# Patient Record
Sex: Male | Born: 1950 | Race: White | Hispanic: No | Marital: Married | Smoking: Current every day smoker
Health system: Southern US, Community
[De-identification: ages and names within clinical notes are randomized; demographics above are authoritative.]

## PROBLEM LIST (undated history)

## (undated) ENCOUNTER — Emergency Department (HOSPITAL_COMMUNITY): Discharge status: Against Medical Advice | Payer: Medicare Other

## (undated) DIAGNOSIS — K219 Gastro-esophageal reflux disease without esophagitis: Secondary | ICD-10-CM

## (undated) DIAGNOSIS — R0602 Shortness of breath: Secondary | ICD-10-CM

## (undated) DIAGNOSIS — I2510 Atherosclerotic heart disease of native coronary artery without angina pectoris: Secondary | ICD-10-CM

## (undated) DIAGNOSIS — Z8489 Family history of other specified conditions: Secondary | ICD-10-CM

## (undated) DIAGNOSIS — J449 Chronic obstructive pulmonary disease, unspecified: Secondary | ICD-10-CM

## (undated) DIAGNOSIS — I219 Acute myocardial infarction, unspecified: Secondary | ICD-10-CM

## (undated) DIAGNOSIS — J189 Pneumonia, unspecified organism: Secondary | ICD-10-CM

## (undated) DIAGNOSIS — C801 Malignant (primary) neoplasm, unspecified: Secondary | ICD-10-CM

## (undated) DIAGNOSIS — I10 Essential (primary) hypertension: Secondary | ICD-10-CM

## (undated) DIAGNOSIS — G4730 Sleep apnea, unspecified: Secondary | ICD-10-CM

## (undated) DIAGNOSIS — E7800 Pure hypercholesterolemia, unspecified: Secondary | ICD-10-CM

## (undated) HISTORY — PX: ANTERIOR CRUCIATE LIGAMENT REPAIR: SHX115

## (undated) HISTORY — PX: TONSILLECTOMY: SUR1361

## (undated) HISTORY — PX: APPENDECTOMY: SHX54

---

## 2002-08-10 DIAGNOSIS — I219 Acute myocardial infarction, unspecified: Secondary | ICD-10-CM

## 2002-08-10 HISTORY — PX: CORONARY ANGIOPLASTY: SHX604

## 2002-08-10 HISTORY — DX: Acute myocardial infarction, unspecified: I21.9

## 2003-02-04 ENCOUNTER — Encounter: Payer: Self-pay | Admitting: Emergency Medicine

## 2003-02-04 ENCOUNTER — Inpatient Hospital Stay (HOSPITAL_COMMUNITY): Admission: AD | Admit: 2003-02-04 | Discharge: 2003-02-06 | Payer: Self-pay | Admitting: Cardiology

## 2003-09-14 ENCOUNTER — Ambulatory Visit (HOSPITAL_COMMUNITY): Admission: RE | Admit: 2003-09-14 | Discharge: 2003-09-14 | Payer: Self-pay | Admitting: *Deleted

## 2007-11-17 ENCOUNTER — Ambulatory Visit (HOSPITAL_COMMUNITY): Admission: RE | Admit: 2007-11-17 | Discharge: 2007-11-17 | Payer: Self-pay | Admitting: Pulmonary Disease

## 2007-11-29 ENCOUNTER — Ambulatory Visit (HOSPITAL_COMMUNITY): Admission: RE | Admit: 2007-11-29 | Discharge: 2007-11-29 | Payer: Self-pay | Admitting: Pulmonary Disease

## 2007-12-15 ENCOUNTER — Ambulatory Visit: Payer: Self-pay | Admitting: Internal Medicine

## 2007-12-15 ENCOUNTER — Encounter: Payer: Self-pay | Admitting: Internal Medicine

## 2007-12-15 ENCOUNTER — Ambulatory Visit (HOSPITAL_COMMUNITY): Admission: RE | Admit: 2007-12-15 | Discharge: 2007-12-15 | Payer: Self-pay | Admitting: Internal Medicine

## 2009-01-08 ENCOUNTER — Ambulatory Visit (HOSPITAL_COMMUNITY): Admission: RE | Admit: 2009-01-08 | Discharge: 2009-01-08 | Payer: Self-pay | Admitting: Pulmonary Disease

## 2009-01-23 ENCOUNTER — Ambulatory Visit (HOSPITAL_COMMUNITY): Admission: RE | Admit: 2009-01-23 | Discharge: 2009-01-23 | Payer: Self-pay | Admitting: Pulmonary Disease

## 2009-03-15 ENCOUNTER — Ambulatory Visit: Admission: RE | Admit: 2009-03-15 | Discharge: 2009-03-15 | Payer: Self-pay | Admitting: Pulmonary Disease

## 2009-07-03 ENCOUNTER — Ambulatory Visit (HOSPITAL_COMMUNITY): Admission: RE | Admit: 2009-07-03 | Discharge: 2009-07-03 | Payer: Self-pay | Admitting: Pulmonary Disease

## 2009-07-26 ENCOUNTER — Ambulatory Visit (HOSPITAL_COMMUNITY): Admission: RE | Admit: 2009-07-26 | Discharge: 2009-07-26 | Payer: Self-pay | Admitting: Pulmonary Disease

## 2010-12-23 NOTE — Procedures (Signed)
NAME:  Carlos Goodwin, Carlos Goodwin NO.:  [id number]   MEDICAL RECORD NO.:  [id number]          PATIENT TYPE:  OUT   LOCATION:  SLEE                          FACILITY:  [facility]   PHYSICIAN:  Kofi A. Gerilyn Pilgrim, M.D. DATE OF BIRTH:  27-Oct-1950   DATE OF PROCEDURE:  03/15/2009  DATE OF DISCHARGE:  03/15/2009                             SLEEP DISORDER REPORT   INDICATIONS:  A 60 year old man who presents with loud snoring,  hypersomnia, and is being evaluated for obstructive sleep apnea  syndrome.   MEDICATIONS:  Spiriva.   EPWORTH SLEEPINESS SCALE:  14.   BMI:  39.   ARCHITECTURAL SUMMARY:  This is a split-night recording, with the first  part being a diagnostic and the second part a titration recording.  The  total recording time is 400.5 minutes.  The sleep efficiency is 85%.  Sleep latency is 27.5 minutes.  REM latency 65 minutes.   RESPIRATORY SUMMARY:  Baseline oxygen saturation 90.  Lowest saturation  62 during REM sleep during the first part of the recording.  The patient  had a diagnostic AHI of 100.  The patient was subsequently titrated  between pressures of 6 and 18, with an optimal pressure of 16 resulting  in resolution of events, with no residual central or obstructive apneas.  Higher pressures tended to result in more central events.   LIMB MOVEMENT SUMMARY:  PLM index is 24.   ELECTROCARDIOGRAM SUMMARY:  Average heart rate 67, with no significant  dysrhythmias observed.   IMPRESSION:  1. Severe obstructive sleep apnea syndrome, which responds well to a      continuous positive airway pressure of 16.  2. 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   Thanks for this referral.      Kofi A. Gerilyn Pilgrim, M.D.  Electronically Signed     KAD/MEDQ  D:  03/22/2009  T:  03/22/2009  Job:  [id number]

## 2010-12-23 NOTE — Procedures (Signed)
NAME:  Carlos Goodwin, Carlos Goodwin NO.:  [id number]   MEDICAL RECORD NO.:  [id number]          PATIENT TYPE:  OUT   LOCATION:  RESP                          FACILITY:  [facility]   PHYSICIAN:  Edward L. Juanetta Gosling, M.D.DATE OF BIRTH:  1951/06/15   DATE OF PROCEDURE:  11/29/2007  DATE OF DISCHARGE:                            PULMONARY FUNCTION TEST   1. Spirometry shows a mild-to-moderate ventilatory defect with      evidence of airflow obstruction.  2. Lung volumes show a separate restrictive change.  Noting the      patient's height and weight, this may be related to body habitus.  3. DLCO is normal.  4. Arterial blood gases show relative hypoxia and elevation of pCO2,      which is chronic suggesting chronic respiratory failure.  5. There is no significant bronchodilator improvement.      Edward L. Juanetta Gosling, M.D.  Electronically Signed     ELH/MEDQ  D:  11/29/2007  T:  11/30/2007  Job:  [id number]

## 2010-12-23 NOTE — Op Note (Signed)
NAME:  OLLIE, ESTY NO.:  [id number]   MEDICAL RECORD NO.:  [id number]          PATIENT TYPE:  AMB   LOCATION:  DAY                           FACILITY:  [facility]   PHYSICIAN:  R. Roetta Sessions, M.D. DATE OF BIRTH:  1951/01/26   DATE OF PROCEDURE:  12/15/2007  DATE OF DISCHARGE:                               OPERATIVE REPORT   Colonoscopy with snare polypectomy and ablation biopsy.   INDICATIONS FOR PROCEDURE:  A 60 year old gentleman comes for screening  colonoscopy.  He has no lower GI tract symptoms.  No family history of  colon cancer in any first-degree relatives.  He had a colonoscopy for  rectal bleeding some 20 years ago in [place] without significant  findings.  Colonoscopy is now being done.  This approach has been  discussed with the patient at length.  Potential risks, benefits, and  alternatives have been reviewed.  Questions are answered.  He is  agreeable.  Please see documentation in medical record.   PROCEDURE NOTE:  O2 saturation, blood pressure, pulse, and respirations  were monitored throughout the entire procedure.   CONSCIOUS SEDATION:  Versed 5 mg IV and Demerol 75 mg IV in divided  doses.   INSTRUMENT:  Pentax video chip system.   FINDINGS:  Digital rectal exam revealed no abnormalities.   ENDOSCOPIC FINDINGS:  Prep was poor, particularly on the right side made  exam more difficult.  Colon:  Colonic mucosa was surveyed from the  rectosigmoid junction to the left transverse right colon to the  appendiceal orifice, ileocecal valve, and cecum.  These structures are  seen and photographed for the record.  From this level, scope was slowly  withdrawn.  All previous mentioned mucosal surfaces were again seen.  The patient has scattered left-sided diverticula.  There was 5-6 mm  rectosigmoid polyp, which was hot snared and there were 2 adjacent  diminutive polyps, which were cold biopsied.  There was an 8-mm polyp in  the mid descending  colon, which was hot snared and there were 2 adjacent  diminutive polyps, which were ablated with the tip of hot snare cautery  unit.  The remainder of colonic mucosa appeared normal.  However, the  prep particular in the right side was poor and despite copious washing  and lavage, I was unable to clear the colon to inspect the finer mucosal  detail of the colon.  On this side, the poor prep made exam more  difficult.  Scope was pulled down the rectum where a thorough  examination of the rectal mucosa including retroflexed view of the anal  verge demonstrated no abnormalities.  The patient tolerated the  procedure well and was reactive to endoscopy.   IMPRESSION:  1. Normal rectum.  2. Left-sided diverticula, polyps in the descending and rectosigmoid      areas as described above, ablated and/or removed as described      above.  3. Poor prep compromised exam.   RECOMMENDATIONS:  1. Diverticulosis.  Polyp literature provided to Mr. Delcarlo.  2. Follow up on path.  3. Further recommendations to  follow.      Jonathon Bellows, M.D.  Electronically Signed     RMR/MEDQ  D:  12/15/2007  T:  12/15/2007  Job:  [id number]   cc:   Ramon Dredge L. Juanetta Gosling, M.D.  Fax: [phone]

## 2010-12-26 NOTE — Cardiovascular Report (Signed)
NAME:  Carlos Goodwin, ROOD NO.:  [id number]   MEDICAL RECORD NO.:  [id number]                   PATIENT TYPE:  INP   LOCATION:  2004                                 FACILITY:  [facility]   PHYSICIAN:  Charlies Constable, M.D.                  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  Troponins  returned positive for a non ST elevation infarction and he was transferred  to [facility] for further evaluation and angiography.  This was performed  by Charlton Haws, M.D. early this morning and showed a tight lesion in the  proximal LAD.   PROCEDURE:  The procedure was performed via the right femoral artery  utilizing an arterial sheath and 7-French JL4 guiding catheter with side  holes.  The patient was on an Integrilin drip and was given additional  heparin for an ACT greater than 200 seconds.  We crossed the lesion in the  proximal LAD with a Luge wire without difficulty.  We predilated with a 4.0  x 15 mm Quantum Maverick performing three inflations up to 12 atmospheres  for 30 seconds.  We then deployed a 2.5 x 18 mm CYPHER stent filling this  with one inflation up to 18 atmospheres for 30 seconds.  We then post  dilated with the 4.0 x 15 mm Quantum Maverick performing two inflations up  to 18 atmospheres for 30 seconds.  Repeat diagnostics were then performed  through the guiding catheter.  The patient tolerated procedure well and left  the laboratory in satisfactory condition.   RESULTS:  Initially the stenosis in the proximal LAD was estimated at 95%.  Following stenting this improved to less than 10%.  There is no dissection  seen.   CONCLUSION:  Successful  stenting of the lesion in the proximal left anterior  descending artery with a CYPHER stent with improvement in-stent narrowing  from 95% to less than 10%.   DISPOSITION:  The patient will stay for further observation.                                               Charlies Constable, M.D.    BB/MEDQ  D:  02/05/2003  T:  02/05/2003  Job:  [id number]  Ramon Dredge L. Juanetta Gosling, M.D.  [address]  [place]  [place] [address]  Fax: [phone]   Jesse Sans. Wall, M.D.   Cardiopulmonary Lab   cc:   Ramon Dredge L. Juanetta Gosling,  M.D.  [address]  [place]  [place] [address]  Fax: [phone]   Jesse Sans. Wall, M.D.   Cardiopulmonary Lab

## 2010-12-26 NOTE — H&P (Signed)
NAME:  Carlos Goodwin, Carlos Goodwin NO.:  [id number]   MEDICAL RECORD NO.:  [id number]                   PATIENT TYPE:  INP   LOCATION:  2004                                 FACILITY:  [facility]   PHYSICIAN:  Creta Levin, M.D. [facility]      DATE OF BIRTH:  October 02, 1950   DATE OF ADMISSION:  02/04/2003  DATE OF DISCHARGE:                                HISTORY & PHYSICAL   CHIEF COMPLAINT:  Chest pain.   HISTORY OF PRESENT ILLNESS:  Carlos Goodwin is a 60 year old male with a history  of hyperlipidemia and ongoing tobacco use who was in his usual state of  health until 11:00 p.m. yesterday when he developed substernal chest pain  that lasted for 30 minutes and resolved. Today around noon, he developed 8  over 10 substernal chest pain that radiated to his left upper extremity, was  associated with dyspnea and diaphoresis as well as presyncope. He was  brought to [facility] by his wife where he was given sublingual  nitroglycerin and had complete relief of pain. His ECG was unremarkable but  he did have a borderline troponin elevation. He was therefore sent to [facility] for further management.   PAST MEDICAL HISTORY:  1. Hyperlipidemia. The patient was prescribed Lipitor by his primary care     physician 3-4 years ago but he self-discontinued this due to complaints     of fatigue.  2. Appendectomy.  3. Tonsillectomy.  4. He does have occasional heart burn.   MEDICATIONS:  Zantac p.r.n.   ALLERGIES:  No known drug allergies.   SOCIAL HISTORY:  He lives in [place], [place] with his wife and works as a  Corporate investment banker. He has a 40+ pack year smoking history and currently  smokes 1 pack per day. He has 2-3 drinks per week. He does not follow a  specific diet and does not formerly exercise.   FAMILY HISTORY:  Significant for coronary disease in his father with onset  in his 77's. His father died in his 81's.   REVIEW OF SYSTEMS:  Positive for some  sweats as well as chest discomfort,  dyspnea, and diaphoresis and pre-syncope as mentioned in the HPI. He does  have occasional reflux symptoms but does not have any at this time.   PHYSICAL EXAMINATION:  VITAL SIGNS: Afebrile. Blood pressure is 156/90.  Heart rate is 78 and regular. Weight was 205.8 pounds and height is 5 feet  10 inches.  GENERAL: He is in no acute distress.  HEENT: Unremarkable.  NECK: No JVD. No bruits.  CARDIOVASCULAR: Regular rate and rhythm without murmur, rub, or gallop.  LUNGS: Revealed mild upper airway crackles and wheezes consistent with  smokers lungs.  ABDOMEN: Soft, nondistended and nontender without organomegaly. Bowel sounds  are normal.  EXTREMITIES: No clubbing, cyanosis, or edema.  NEUROLOGIC: Nonfocal.   LABORATORY DATA:  Thus far, his white count is 10.4, hemoglobin  15.2,  hematocrit 45.3, platelet count 294,000. Sodium 141, potassium 4.3, chloride  107, bicarbonate 28, BUN 14, creatinine 1.3, glucose 115. CK was 59, MB 1.4,  troponin I 0.07. PTT 29, PT 11.9, INR 0.8.   Portable chest x-ray from the outside hospital showed no acute disease. His  ECG revealed normal sinus rhythm with no evidence of ST or T wave  abnormalities.   ASSESSMENT/PLAN:  1. Chest pain, acute coronary syndrome. The patient has a good store and a     borderline troponin elevation. He was started on Lovenox at [facility] and I will continue this and add Integrelin. He was given 5 mg     of IV Metoprolol and I will start 25 mg p.o. q. 6h. I will also start     Ramipril 2.5 mg per day and Zocor 40 mg q.h.s. In the morning, we will     check his lipid panel and we will also check a hemoglobin A1C. We will     obtain at least 2 more sets of cardiac markers and likely proceed with     cardiac catheterization tomorrow. Further treatment will be determined     based on his coronary anatomy.  2. Smoking. I discussed smoking cessation at pretty good length with  the     patient. He understands that he needs to quit and perhaps we can arrange     for him a __________ approach.                                                  Creta Levin, M.D. [facility]    RPK/MEDQ  D:  02/04/2003  T:  02/04/2003  Job:  [id number]

## 2010-12-26 NOTE — Cardiovascular Report (Signed)
   NAME:  Carlos Goodwin, Carlos Goodwin NO.:  [id number]   MEDICAL RECORD NO.:  [id number]                   PATIENT TYPE:  INP   LOCATION:  2004                                 FACILITY:  [facility]   PHYSICIAN:  Charlton Haws, M.D.                  DATE OF BIRTH:  [birth year]   DATE OF PROCEDURE:  02/05/2003  DATE OF DISCHARGE:                              CARDIAC CATHETERIZATION   INDICATION:  Unstable angina.   PROCEDURE:  Standard catheterization was done from the right femoral artery.   The left main coronary artery has a 20% discrete lesion.   The left anterior descending artery was a large caliber vessel.  There is a  95% mid vessel lesion.   The first diagonal branch was somewhat small with a 20% discrete lesion.   The circumflex coronary artery was large and left dominant.  There was 20-  30% discrete lesions in the first obtuse marginal branch.  There was 30%  tubular disease distally.   The right coronary artery was small and nondominant.   RIGHT VENTRICULOGRAPHY:  Right ventricular angiography showed moderate  anterior apical wall hypokinesis.  The EF was 50%.  There was no MR and no  gradient across the  aortic valve.   PRESSURES:  Aortic pressure was 135/81, LV pressure was 136/19 range.   IMPRESSION:  Films will be reviewed with Dr. Juanda Chance.  We will most likely  refer him for angioplasty and stenting of the mid left anterior descending.                                                Charlton Haws, M.D.    PN/MEDQ  D:  02/05/2003  T:  02/05/2003  Job:  [id number]  Ramon Dredge L. Juanetta Gosling, M.D.  [address]  [place]  [place] [address]  Fax: [phone]   Jesse Sans. Wall, M.D.   cc:   Oneal Deputy. Juanetta Gosling, M.D.  [address]  [place]  [place] [address]  Fax: [phone]   Jesse Sans. Wall, M.D.

## 2010-12-26 NOTE — Discharge Summary (Signed)
NAME:  Carlos Goodwin, Carlos Goodwin NO.:  [id number]   MEDICAL RECORD NO.:  [id number]                   PATIENT TYPE:  INP   LOCATION:  6523                                 FACILITY:  [facility]   PHYSICIAN:  [place] Bing, M.D.               DATE OF BIRTH:  18-Mar-1951   DATE OF ADMISSION:  02/04/2003  DATE OF DISCHARGE:  02/06/2003                           DISCHARGE SUMMARY - REFERRING   PROCEDURES:  Coronary angiogram/stent ([facility]) June 28.   REASON FOR ADMISSION:  Carlos Goodwin is a 60 year old male, with no prior  history of heart disease and multiple risk factors notable for dyslipidemia  and continued tobacco use, who initially presented to [facility]  for evaluation of new-onset chest discomfort.  He reported complete relief  with nitroglycerine; initial Troponin was slightly elevated; the patient was  stabilized with recommendation to be transferred to [facility] for further  management and diagnostic evaluation.  Please refer to dictated admission  note for more details.   LABORATORY DATA:  Cardiac enzymes:  Peak CPK benign/1.4; Peak Troponin 0.16.  CBC and metabolic profile normal at discharge.  Hemoglobin A1C 6.1.  Lipid  profile pending.  Admission CXR:  No active disease.   HOSPITAL COURSE:  Following transfer from [facility], the patient  was maintained on a regimen consisting of aspirin, beta blocker, Lovenox,  and Integrilin.  He was started on cholesterol lowering therapy and placed  on an ACE inhibitor.  Serial cardiac enzymes notable for normal total CPK  with mildly elevated Troponin I (0.16).  Diagnostic coronary angiogram  revealed severe single vessel coronary artery disease with a 95% mid LAD  lesion and mild LV dysfunction (approximately 50%), with mild interior  apical hypokinesis.  The patient was referred for percutaneous intervention  and underwent subsequently successful stenting (CYPHER) by Dr. Juanda Chance, with  reduction of the LAD lesion to less than 10% of the stricture stenosis.  The  patient was kept for overnight observation, cleared for discharge the  following morning in hemodynamically stable condition.   Dr. Juanda Chance recommended continuation of Plavix for 3-12 months.  The patient  did receive a smoking cessation consultation while in hospital, and will be  discharged on Wellbutrin.  The patient is also recommended to enroll in  cardiac rehab at [facility].   MEDICATIONS AT DISCHARGE:  1. Plavix 75 mg daily (at least 3 months).  2. Coated aspirin 325 daily.  3. Toprol XL 100 mg daily.  4. Zocor 40 mg q.h.s.  5. Altace 2.5 mg daily.  6. Wellbutrin XL 150 daily.  7. Nitroglycerine 0.4 mg p.r.n.   DISCHARGE INSTRUCTIONS:  No heavy lifting or return to work for 2 weeks; no  driving for one week; low cholesterol diet; call for any skin changes;  discontinue smoking.   FOLLOW UP:  The patient is scheduled to follow up with Dr. Tinnie Gens  Dorethea Clan at  [facility] on July 27 at 12:30 p.m.    DISCHARGE DIAGNOSES:  1. Non-ST-segment myocardial infarction/severe single vessel coronary artery     disease.     A. Status post stent (CYPHER) left anterior descending artery - February 05, 2003.     B. Mild left ventricular dysfunction with ejection fraction of 50%.  2. Tobacco.  3. History of dyslipidemia.     [name], P.A. [facility]                      [place] Bing, M.D.    GS/MEDQ  D:  02/06/2003  T:  02/06/2003  Job:  [id number]   cc:   Ramon Dredge L. Juanetta Gosling, M.D.  [address]  [place]  [place] [address]  Fax: [phone]    cc:   Oneal Deputy. Juanetta Gosling, M.D.  [address]  [place]  [place] [address]  Fax: [phone]

## 2010-12-26 NOTE — Procedures (Signed)
NAME:  DOMENIK, TRICE NO.:  [id number]   MEDICAL RECORD NO.:  [id number]                   PATIENT TYPE:  OUT   LOCATION:  RAD                                  FACILITY:  [facility]   PHYSICIAN:  [place] Bing, M.D.               DATE OF BIRTH:  09/20/1950   DATE OF PROCEDURE:  09/14/2003  DATE OF DISCHARGE:  09/14/2003                                  ECHOCARDIOGRAM   CLINICAL DATA:  A 60 year old gentleman with coronary disease.   M-MODE TRACINGS:  Aorta 2.6.   Left atrium 4.2.   Septum 1.4.   Posterior wall 1.0.   Left ventricular diastole 4.7.   Left ventricular systole 3.7.   IMPRESSION:  1. Technically adequate echocardiographic study.  2. Normal left and right atrial size.  3. Right ventricle appears somewhat dilated; normal right ventricular     systolic function.  4. Normal tricuspid, mitral, aortic, and pulmonic valves.  5. Normal internal dimension, wall thickness, regional and global function     of the left ventricle.  6. Normal inferior vena cava.      ___________________________________________                                            [place] Bing, M.D.   RR/MEDQ  D:  09/16/2003  T:  09/16/2003  Job:  [id number]

## 2011-05-05 LAB — BLOOD GAS, ARTERIAL
TCO2: 24.7
pCO2 arterial: 49.8 — ABNORMAL HIGH
pH, Arterial: 7.382

## 2013-01-03 ENCOUNTER — Encounter: Payer: Self-pay | Admitting: Internal Medicine

## 2013-03-07 ENCOUNTER — Emergency Department (HOSPITAL_COMMUNITY): Payer: Medicare Other

## 2013-03-07 ENCOUNTER — Observation Stay (HOSPITAL_COMMUNITY)
Admission: EM | Admit: 2013-03-07 | Discharge: 2013-03-09 | Disposition: A | Discharge status: Home / Self Care | Payer: Medicare Other | Attending: Pulmonary Disease | Admitting: Pulmonary Disease

## 2013-03-07 ENCOUNTER — Encounter (HOSPITAL_COMMUNITY): Payer: Self-pay | Admitting: *Deleted

## 2013-03-07 DIAGNOSIS — E669 Obesity, unspecified: Secondary | ICD-10-CM | POA: Insufficient documentation

## 2013-03-07 DIAGNOSIS — G4730 Sleep apnea, unspecified: Secondary | ICD-10-CM

## 2013-03-07 DIAGNOSIS — G4733 Obstructive sleep apnea (adult) (pediatric): Secondary | ICD-10-CM | POA: Insufficient documentation

## 2013-03-07 DIAGNOSIS — J4489 Other specified chronic obstructive pulmonary disease: Secondary | ICD-10-CM | POA: Insufficient documentation

## 2013-03-07 DIAGNOSIS — I252 Old myocardial infarction: Secondary | ICD-10-CM | POA: Insufficient documentation

## 2013-03-07 DIAGNOSIS — I2510 Atherosclerotic heart disease of native coronary artery without angina pectoris: Secondary | ICD-10-CM | POA: Insufficient documentation

## 2013-03-07 DIAGNOSIS — R0989 Other specified symptoms and signs involving the circulatory and respiratory systems: Secondary | ICD-10-CM | POA: Insufficient documentation

## 2013-03-07 DIAGNOSIS — Z720 Tobacco use: Secondary | ICD-10-CM

## 2013-03-07 DIAGNOSIS — Z23 Encounter for immunization: Secondary | ICD-10-CM | POA: Insufficient documentation

## 2013-03-07 DIAGNOSIS — E7800 Pure hypercholesterolemia, unspecified: Secondary | ICD-10-CM | POA: Insufficient documentation

## 2013-03-07 DIAGNOSIS — F17200 Nicotine dependence, unspecified, uncomplicated: Secondary | ICD-10-CM | POA: Insufficient documentation

## 2013-03-07 DIAGNOSIS — J449 Chronic obstructive pulmonary disease, unspecified: Secondary | ICD-10-CM | POA: Diagnosis present

## 2013-03-07 DIAGNOSIS — I10 Essential (primary) hypertension: Secondary | ICD-10-CM | POA: Insufficient documentation

## 2013-03-07 DIAGNOSIS — R072 Precordial pain: Principal | ICD-10-CM | POA: Insufficient documentation

## 2013-03-07 DIAGNOSIS — R0609 Other forms of dyspnea: Secondary | ICD-10-CM | POA: Insufficient documentation

## 2013-03-07 DIAGNOSIS — Z9861 Coronary angioplasty status: Secondary | ICD-10-CM | POA: Insufficient documentation

## 2013-03-07 DIAGNOSIS — R079 Chest pain, unspecified: Secondary | ICD-10-CM

## 2013-03-07 DIAGNOSIS — Z6838 Body mass index (BMI) 38.0-38.9, adult: Secondary | ICD-10-CM | POA: Insufficient documentation

## 2013-03-07 DIAGNOSIS — Z79899 Other long term (current) drug therapy: Secondary | ICD-10-CM | POA: Insufficient documentation

## 2013-03-07 HISTORY — DX: Sleep apnea, unspecified: G47.30

## 2013-03-07 HISTORY — DX: Chronic obstructive pulmonary disease, unspecified: J44.9

## 2013-03-07 HISTORY — DX: Pure hypercholesterolemia, unspecified: E78.00

## 2013-03-07 HISTORY — DX: Acute myocardial infarction, unspecified: I21.9

## 2013-03-07 HISTORY — DX: Essential (primary) hypertension: I10

## 2013-03-07 HISTORY — DX: Atherosclerotic heart disease of native coronary artery without angina pectoris: I25.10

## 2013-03-07 LAB — PRO B NATRIURETIC PEPTIDE: Pro B Natriuretic peptide (BNP): 185.9 pg/mL — ABNORMAL HIGH (ref 0–125)

## 2013-03-07 LAB — CBC
MCV: 88.6 fL (ref 78.0–100.0)
Platelets: 211 10*3/uL (ref 150–400)
RDW: 14.6 % (ref 11.5–15.5)
WBC: 10.2 10*3/uL (ref 4.0–10.5)

## 2013-03-07 LAB — TROPONIN I
Troponin I: <0.3 ng/mL (ref <0.30)
Troponin I: <0.3 ng/mL (ref <0.30)

## 2013-03-07 LAB — LIPID PANEL
Cholesterol: 190 mg/dL (ref 0–200)
Total CHOL/HDL Ratio: 5.1 RATIO
VLDL: 28 mg/dL (ref 0–40)

## 2013-03-07 LAB — BASIC METABOLIC PANEL
Calcium: 9.4 mg/dL (ref 8.4–10.5)
Chloride: 101 mEq/L (ref 96–112)
Creatinine, Ser: 0.95 mg/dL (ref 0.50–1.35)
GFR calc Af Amer: >90 mL/min (ref >90)
Sodium: 138 mEq/L (ref 135–145)

## 2013-03-07 MED ORDER — NITROGLYCERIN 2 % TD OINT
0.5000 [in_us] | TOPICAL_OINTMENT | Freq: Once | TRANSDERMAL | Status: AC
  Administered 2013-03-07: 0.5 [in_us] via TOPICAL
  Filled 2013-03-07: qty 1

## 2013-03-07 MED ORDER — PNEUMOCOCCAL VAC POLYVALENT 25 MCG/0.5ML IJ INJ
0.5000 mL | INJECTION | INTRAMUSCULAR | Status: AC
  Administered 2013-03-08: 0.5 mL via INTRAMUSCULAR
  Filled 2013-03-07: qty 0.5

## 2013-03-07 MED ORDER — SODIUM CHLORIDE 0.9 % IJ SOLN: 3.0000 mL | INTRAMUSCULAR | Status: DC | PRN

## 2013-03-07 MED ORDER — SODIUM CHLORIDE 0.9 % IJ SOLN: 3.0000 mL | Freq: Two times a day (BID) | INTRAMUSCULAR | Status: DC

## 2013-03-07 MED ORDER — BISACODYL 10 MG RE SUPP: 10.0000 mg | Freq: Every day | RECTAL | Status: DC | PRN

## 2013-03-07 MED ORDER — ENOXAPARIN SODIUM 40 MG/0.4ML ~~LOC~~ SOLN
40.0000 mg | SUBCUTANEOUS | Status: DC
  Administered 2013-03-07: 40 mg via SUBCUTANEOUS
  Filled 2013-03-07 (×2): qty 0.4

## 2013-03-07 MED ORDER — ASPIRIN 325 MG PO TABS
325.0000 mg | ORAL_TABLET | Freq: Every day | ORAL | Status: DC
  Administered 2013-03-07 – 2013-03-08 (×2): 325 mg via ORAL
  Filled 2013-03-07 (×3): qty 1

## 2013-03-07 MED ORDER — TRAZODONE 25 MG HALF TABLET
25.0000 mg | ORAL_TABLET | Freq: Every evening | ORAL | Status: DC | PRN
  Filled 2013-03-07: qty 1

## 2013-03-07 MED ORDER — ONDANSETRON HCL 4 MG PO TABS: 4.0000 mg | ORAL_TABLET | Freq: Four times a day (QID) | ORAL | Status: DC | PRN

## 2013-03-07 MED ORDER — SODIUM CHLORIDE 0.9 % IJ SOLN
3.0000 mL | Freq: Two times a day (BID) | INTRAMUSCULAR | Status: DC
  Administered 2013-03-07 – 2013-03-08 (×3): 3 mL via INTRAVENOUS

## 2013-03-07 MED ORDER — FAMOTIDINE 10 MG PO TABS
10.0000 mg | ORAL_TABLET | Freq: Every day | ORAL | Status: DC
  Administered 2013-03-07 – 2013-03-08 (×2): 10 mg via ORAL
  Filled 2013-03-07 (×2): qty 1

## 2013-03-07 MED ORDER — ONDANSETRON HCL 4 MG/2ML IJ SOLN: 4.0000 mg | Freq: Four times a day (QID) | INTRAMUSCULAR | Status: DC | PRN

## 2013-03-07 MED ORDER — SODIUM CHLORIDE 0.9 % IV SOLN: 250.0000 mL | INTRAVENOUS | Status: DC | PRN

## 2013-03-07 MED ORDER — SIMVASTATIN 40 MG PO TABS
40.0000 mg | ORAL_TABLET | Freq: Every evening | ORAL | Status: DC
  Administered 2013-03-07 – 2013-03-08 (×2): 40 mg via ORAL
  Filled 2013-03-07: qty 2
  Filled 2013-03-07 (×2): qty 1

## 2013-03-07 MED ORDER — ALBUTEROL SULFATE (5 MG/ML) 0.5% IN NEBU
2.5000 mg | INHALATION_SOLUTION | Freq: Four times a day (QID) | RESPIRATORY_TRACT | Status: DC
  Administered 2013-03-07 – 2013-03-08 (×3): 2.5 mg via RESPIRATORY_TRACT
  Filled 2013-03-07 (×4): qty 0.5

## 2013-03-07 MED ORDER — MORPHINE SULFATE 2 MG/ML IJ SOLN: 1.0000 mg | INTRAMUSCULAR | Status: DC | PRN

## 2013-03-07 MED ORDER — IPRATROPIUM BROMIDE 0.02 % IN SOLN
0.5000 mg | Freq: Four times a day (QID) | RESPIRATORY_TRACT | Status: DC
  Administered 2013-03-07 – 2013-03-08 (×3): 0.5 mg via RESPIRATORY_TRACT
  Filled 2013-03-07 (×4): qty 2.5

## 2013-03-07 MED ORDER — NITROGLYCERIN 0.4 MG SL SUBL
0.4000 mg | SUBLINGUAL_TABLET | SUBLINGUAL | Status: DC | PRN
  Administered 2013-03-07 (×3): 0.4 mg via SUBLINGUAL
  Filled 2013-03-07: qty 25

## 2013-03-07 MED ORDER — MAGNESIUM HYDROXIDE 400 MG/5ML PO SUSP: 30.0000 mL | Freq: Every day | ORAL | Status: DC | PRN

## 2013-03-07 MED ORDER — ACETAMINOPHEN 325 MG PO TABS: 650.0000 mg | ORAL_TABLET | Freq: Four times a day (QID) | ORAL | Status: DC | PRN

## 2013-03-07 MED ORDER — SENNA 8.6 MG PO TABS
1.0000 | ORAL_TABLET | Freq: Two times a day (BID) | ORAL | Status: DC
  Administered 2013-03-07 – 2013-03-08 (×2): 8.6 mg via ORAL
  Filled 2013-03-07 (×5): qty 1

## 2013-03-07 MED ORDER — HYDROCODONE-ACETAMINOPHEN 5-325 MG PO TABS: 1.0000 | ORAL_TABLET | ORAL | Status: DC | PRN

## 2013-03-07 MED ORDER — ASPIRIN EC 325 MG PO TBEC: 325.0000 mg | DELAYED_RELEASE_TABLET | Freq: Every day | ORAL | Status: DC

## 2013-03-07 MED ORDER — METOPROLOL SUCCINATE ER 100 MG PO TB24
100.0000 mg | ORAL_TABLET | Freq: Every day | ORAL | Status: DC
  Administered 2013-03-08 – 2013-03-09 (×2): 100 mg via ORAL
  Filled 2013-03-07: qty 2
  Filled 2013-03-07: qty 1

## 2013-03-07 MED ORDER — ACETAMINOPHEN 650 MG RE SUPP: 650.0000 mg | Freq: Four times a day (QID) | RECTAL | Status: DC | PRN

## 2013-03-07 MED ORDER — TIOTROPIUM BROMIDE MONOHYDRATE 18 MCG IN CAPS
18.0000 ug | ORAL_CAPSULE | Freq: Every day | RESPIRATORY_TRACT | Status: DC
  Filled 2013-03-07 (×2): qty 5

## 2013-03-07 MED ORDER — ALUM & MAG HYDROXIDE-SIMETH 200-200-20 MG/5ML PO SUSP: 30.0000 mL | Freq: Four times a day (QID) | ORAL | Status: DC | PRN

## 2013-03-07 NOTE — ED Notes (Signed)
Pt pain free at present,

## 2013-03-07 NOTE — ED Notes (Signed)
Pt states that the first nitro decreased pressure to a 1or 2 and decreased the sob,

## 2013-03-07 NOTE — ED Provider Notes (Signed)
CSN: [id number]     Arrival date & time 03/07/13  1350 History     First MD Initiated Contact with Patient 03/07/13 1400     Chief Complaint  Patient presents with  . Chest Pain   (Consider location/radiation/quality/duration/timing/severity/associated sxs/prior Treatment) HPI Comments: Patient with past medical history of coronary artery disease status post stent in 2004 as well as COPD, HTN and HLD presents to the emergency department with complaint of substernal pressure-like chest pain which started yesterday.  He states that the pain radiates up to his neck and that he has some associated numbness in his left arm. He states that the pain has been waxing and waning and nothing makes it better or worse. He associates some dyspnea with the pain; however, he denies any nausea vomiting or pleurisy. He denies diaphoresis. He states that he had similar pain to this in 2004 when he required a stent.   Past Medical History  Diagnosis Date  . MI (myocardial infarction)   . COPD (chronic obstructive pulmonary disease)   . Coronary artery disease   . Sleep apnea   . Hypertension   . High cholesterol    Past Surgical History  Procedure Laterality Date  . Appendectomy     No family history on file. History  Substance Use Topics  . Smoking status: Current Every Day Smoker    Types: Cigarettes  . Smokeless tobacco: Not on file  . Alcohol Use: No    Review of Systems  Constitutional: Negative.   HENT: Negative.   Respiratory:       See history of present illness  Cardiovascular: Negative for palpitations and leg swelling.       See history of present illness  Gastrointestinal: Negative.   Endocrine: Negative.   Genitourinary: Negative.   Skin: Negative.   Neurological: Negative.   All other systems reviewed and are negative.     Allergies  Other  Home Medications   Current Outpatient Rx  Name  Route  Sig  Dispense  Refill  . aspirin EC 325 MG tablet   Oral   Take 325  mg by mouth daily.         . metoprolol succinate (TOPROL-XL) 100 MG 24 hr tablet   Oral   Take 100 mg by mouth daily. Take with or immediately following a meal.         . ranitidine (ZANTAC) 150 MG tablet   Oral   Take 150 mg by mouth 2 (two) times daily.         . simvastatin (ZOCOR) 40 MG tablet   Oral   Take 40 mg by mouth every evening.         . tiotropium (SPIRIVA) 18 MCG inhalation capsule   Inhalation   Place 18 mcg into inhaler and inhale daily.          BP 163/95  Pulse 77  Temp(Src) 98.4 F (36.9 C) (Oral)  Resp 23  Ht 5\' 10"  (1.778 m)  Wt 272 lb (123.378 kg)  BMI 39.03 kg/m2  SpO2 93% Physical Exam  CONSTITUTIONAL  well developed, well nourished, alert, non toxic appearing HEENT  normocephalic, atraumatic, external ears normal, nose normal, mucus membranes moist EYES  normal conjunctiva, no sclera icterus noted, EOM intact, PERRL NECK  normal ROM, supple, no JVD, trachea normal, no mass CARDIOVASCULAR  no signifcant murmur noted, full and effective pulses; RRR PULMONARY/CHEST WALL  normal effort, no respiratory distress. Diffuse wheezing noted ABDOMINAL  Obese.  no distension, no mass, no pulsatile mass; soft, non tender, no rigidity, guarding or rebound GU deferred MUSCULOSKELETAL  Normal range of motion of extremities;  no peripheral edema, no calf tenderness or cords NEUROLOGICAL  patient is awake and responsive;  no significant motor or sensory deficit noted  SKIN  warm, dry, intact, no rash PSYCHIATRIC  Normal affect   ED Course   Procedures (including critical care time)  Results for orders placed during the hospital encounter of 03/07/13  CBC      Result Value Range   WBC 10.2  4.0 - 10.5 K/uL   RBC 5.55  4.22 - 5.81 MIL/uL   Hemoglobin 16.7  13.0 - 17.0 g/dL   HCT 69.6  29.5 - 28.4 %   MCV 88.6  78.0 - 100.0 fL   MCH 30.1  26.0 - 34.0 pg   MCHC 33.9  30.0 - 36.0 g/dL   RDW 13.2  44.0 - 10.2 %   Platelets 211  150 - 400 K/uL   BASIC METABOLIC PANEL      Result Value Range   Sodium 138  135 - 145 mEq/L   Potassium 4.1  3.5 - 5.1 mEq/L   Chloride 101  96 - 112 mEq/L   CO2 31  19 - 32 mEq/L   Glucose, Bld 93  70 - 99 mg/dL   BUN 17  6 - 23 mg/dL   Creatinine, Ser 7.25  0.50 - 1.35 mg/dL   Calcium 9.4  8.4 - 36.6 mg/dL   GFR calc non Af Amer 87 (*) >90 mL/min   GFR calc Af Amer >90  >90 mL/min  PRO B NATRIURETIC PEPTIDE      Result Value Range   Pro B Natriuretic peptide (BNP) 185.9 (*) 0 - 125 pg/mL  TROPONIN I      Result Value Range   Troponin I <0.30  <0.30 ng/mL   Dg Chest Port 1 View  03/07/2013   *RADIOLOGY REPORT*  Clinical Data: Chest pain  PORTABLE CHEST - 1 VIEW  Comparison: 07/03/2009  Findings: Study is limited by poor inspiration and patient's large body habitus.  Cardiomegaly again noted.  No acute infiltrate or pulmonary edema.  Stable calcified pleural plaques and left basilar scarring.  IMPRESSION: Limited study by poor inspiration and patient's large body habitus. Stable calcified pleural plaques and left basilar scarring. Cardiomegaly again noted.  No acute infiltrate or pulmonary edema.   Original Report Authenticated By: Natasha Mead, M.D.      EKG: Normal sinus rhythm at 71 beats per minute; PR interval 150, QRS 92, QTC 432. No significant abnormality noted. Compared to EKG from 02/06/2003 inverted T waves in the anterolateral leads have resolved  MDM  Patient has a history of ACS status post stents now presenting to the emergency department with symptoms similar to previous. EKG shows no ischemic changes. Initial troponin was negative. Clinical history does not sound like PE or aortic etiology.  CXR with no acute findings.  Patient's pain was relieved after nitroglycerin x3. Nitro paste placed and aspirin given. Plan for admission for ACS workup  Ashby Dawes, MD 03/07/13 [phone]

## 2013-03-07 NOTE — ED Notes (Signed)
Central cp starting last night with sob and dizziness.

## 2013-03-07 NOTE — Progress Notes (Signed)
Pt set up on CPAP 10 with 2lpm bleed in pt tolerate well vital within normal limits. Will monitor throughout the night

## 2013-03-07 NOTE — ED Notes (Signed)
Pt c/o non radiating, constant pain to center chest since yesterday afternoon. Pain described as "pressure". NAD at this time.

## 2013-03-07 NOTE — ED Notes (Addendum)
Pt c/o mid center chest pressure, described as constant, sob, denies any radiation, n/v, states that the pain started when he was helping a friend work on his car. Pt on oxygen at 2 lpm via [place], NSR on monitor, pt alert, able to answer questions, family at bedside,

## 2013-03-07 NOTE — H&P (Signed)
[facility] History and Physical  ELIAZER HEMPHILL AOZ:[id number] DOB: 1951/05/08 DOA: 03/07/2013  Referring physician:  PCP: Fredirick Maudlin, MD  Specialists:   Chief Complaint:   HPI: Carlos Goodwin is a very pleasant 62 y.o. male with a past medical history that includes COPD, non-ST segment MI status post stent 2004, dyslipidemia tobacco use, hypertension, obesity presents to the emergency room with a chief complaint of chest pain. Information is obtained from the patient. He reports that yesterday while he was working on his car he developed chest "pressure". He describes the sensation as pressure-like located in his left anterior chest. He states that the pain radiated to his neck and left shoulder with some numbness in the left arm. He says the episode lasted 3-4 hours. He he took aspirin and drank a carbonated drink he reports belching a couple of times and feeling somewhat better. He reports the pain a 8/10 prior to the aspirin and a carbonated drink and it 3/10 afterwards. He states that he slept somewhat but was anxious because the chest pain never really went away. He also indicates that in 2004 he had an MI and these sensation was much the same. When he got up this morning he continued to have the chest pain at a much lower rate he assisted a friend with moving and doing this activity the chest pain increased to about an 8/10 and he also experienced some worsening shortness of breath and dizziness. He denied any diaphoresis or nausea or vomiting. He states at this point he went home took a shower decided to come to the emergency room. He states he was not pain free until after the third nitroglycerin pill that he received in the emergency room. Initial troponin negative. Vital signs within the limits of normal . EKG yields a normal sinus rhythm.     Symptoms came on suddenly have persisted characterized as moderate. [facility] are asked to admit.   Review of Systems: The patient  denies anorexia, fever, weight loss,, vision loss, decreased hearing, hoarseness,  syncope,  peripheral edema, balance deficits, hemoptysis, abdominal pain, melena, hematochezia, severe indigestion/heartburn, hematuria, incontinence, genital sores, muscle weakness, suspicious skin lesions, transient blindness, difficulty walking, depression, unusual weight change, abnormal bleeding, enlarged lymph nodes, angioedema, and breast masses.    Past Medical History  Diagnosis Date  . MI (myocardial infarction)   . COPD (chronic obstructive pulmonary disease)   . Coronary artery disease   . Sleep apnea   . Hypertension   . High cholesterol    Past Surgical History  Procedure Laterality Date  . Appendectomy     Social History:  reports that he has been smoking Cigarettes.  He has been smoking about 0.00 packs per day. He does not have any smokeless tobacco history on file. He reports that he does not drink alcohol or use illicit drugs. Patient lives at home with his family. He is on disability.  Allergies  Allergen Reactions  . Other Rash    States that in 2004 he was placed on a blood thinner that made him break out in a rash.?plavix     No family history on file. father deceased at age 4 from lung cancer. Mother deceased at age 58 due to complications from diabetes. Patient has 4 siblings their collective past medical history is positive for hypertension diabetes and CAD  Prior to Admission medications   Medication Sig Start Date End Date Taking? Authorizing Provider  aspirin EC 325 MG tablet Take  325 mg by mouth daily.   Yes Historical Provider, MD  metoprolol succinate (TOPROL-XL) 100 MG 24 hr tablet Take 100 mg by mouth daily. Take with or immediately following a meal.   Yes Historical Provider, MD  ranitidine (ZANTAC) 150 MG tablet Take 150 mg by mouth 2 (two) times daily.   Yes Historical Provider, MD  simvastatin (ZOCOR) 40 MG tablet Take 40 mg by mouth every evening.   Yes Historical  Provider, MD  tiotropium (SPIRIVA) 18 MCG inhalation capsule Place 18 mcg into inhaler and inhale daily.   Yes Historical Provider, MD   Physical Exam: Filed Vitals:   03/07/13 1410 03/07/13 1427 03/07/13 1443 03/07/13 1533  BP: 163/95 139/80 123/72 123/72  Pulse: 77 70 68   Temp: 98.4 F (36.9 C)     TempSrc: Oral     Resp: 23 16 20 20   Height: 5\' 10"  (1.778 m)     Weight: 272 lb (123.378 kg)     SpO2: 93% 94% 94% 97%     General:  Obese no acute distress  Eyes: PE RRL EOMI, no scleral icterus, somewhat puffy  ENT: Ears clear nose without drainage mucous membranes of his mouth are slightly pale slightly dry very poor dentition  Neck: Supple no JVD full range of motion  Cardiovascular: Regular rate and rhythm no murmur gallop or rub no lower extremity edema  Respiratory: Mild increased work of breathing with conversation. Breath sounds quite diminished throughout. Faint expiratory wheeze auscultated throughout. Very fine crackles bilateral bases  Abdomen: Obese soft positive bowel sounds nontender to palpation no mass no organomegaly noted  Skin: Warm and dry no rash no lesions  Musculoskeletal: No clubbing no cyanosis moves all extremities  Psychiatric: Calm cooperative  Neurologic: Cranial nerves II through XII grossly intact speech clear facial symmetry  Labs on Admission:  Basic Metabolic Panel:  Recent Labs Lab 03/07/13 1400  NA 138  K 4.1  CL 101  CO2 31  GLUCOSE 93  BUN 17  CREATININE 0.95  CALCIUM 9.4   Liver Function Tests: No results found for this basename: AST, ALT, ALKPHOS, BILITOT, PROT, ALBUMIN,  in the last 168 hours No results found for this basename: LIPASE, AMYLASE,  in the last 168 hours No results found for this basename: AMMONIA,  in the last 168 hours CBC:  Recent Labs Lab 03/07/13 1400  WBC 10.2  HGB 16.7  HCT 49.2  MCV 88.6  PLT 211   Cardiac Enzymes:  Recent Labs Lab 03/07/13 1400  TROPONINI <0.30    BNP (last 3  results)  Recent Labs  03/07/13 1400  PROBNP 185.9*   CBG: No results found for this basename: GLUCAP,  in the last 168 hours  Radiological Exams on Admission: Dg Chest Port 1 View  03/07/2013   *RADIOLOGY REPORT*  Clinical Data: Chest pain  PORTABLE CHEST - 1 VIEW  Comparison: 07/03/2009  Findings: Study is limited by poor inspiration and patient's large body habitus.  Cardiomegaly again noted.  No acute infiltrate or pulmonary edema.  Stable calcified pleural plaques and left basilar scarring.  IMPRESSION: Limited study by poor inspiration and patient's large body habitus. Stable calcified pleural plaques and left basilar scarring. Cardiomegaly again noted.  No acute infiltrate or pulmonary edema.   Original Report Authenticated By: Natasha Mead, M.D.    EKG: Independently reviewed. Normal sinus rhythm  Assessment/Plan Principal Problem:   Chest pain: Somewhat atypical in patient with history of MI status post stent in 2004  risk factors include tobacco use obesity dyslipidemia. Will admit to telemetry. Will cycle cardiac enzymes will check a 2-D echo. Patient did have non-ST segment MI status post stent 2004. Chart review indicates mild left ventricular dysfunction with an ejection fraction of 50%. Patient's pain relieved with nitroglycerin and aspirin. Continue nitroglycerin tabs as needed aspirin and his statin. We'll continue his home beta blocker with parameters Will check fasting lipid panel. Provide pain meds and anti-emetic as needed. Will request cardiology consult. Active Problems:    COPD (chronic obstructive pulmonary disease): Chest x-ray yields limited study by poor inspiration and patient's large body habitus. Stable calcified pleural plaques and left basilar scarring. Cardiomegaly again noted. No acute infiltrate or pulmonary edema. Appears stable at baseline. Will provide nebulizers. Will continue spiriva    Coronary artery disease: See #1.    Sleep apnea: Have requested  respiratory therapy consult. Patient  wear CPAP at home    Hypertension: Controlled. Continue his home beta blocker with parameters as heart rate ranged 60-70    High cholesterol: Continue statin. Will check fasting lipid panel   cardiology  Code Status: full Family Communication: sons at bedside Disposition Plan: home when ready  Time spent: 60 minutes  Gwenyth Bender [facility] Pager [phone]  If 7PM-7AM, please contact night-coverage [email or web address] Password [email or web address] 03/07/2013, 4:45 PM

## 2013-03-07 NOTE — H&P (Signed)
Patient seen, independently examined and chart reviewed. I agree with exam, assessment and plan discussed with Toya Smothers, NP.  Very pleasant 62 year old man with history of coronary artery disease, NSTEMI with 95% proximal LAD stenosis successfully stented 2004 who presented to the emergency department today with approximately 24 hours of chest pain. His pain was relieved in the emergency department with nitroglycerin, initial troponin was negative, EKG nonacute, patient pain-free and referred for further evaluation.  Patient reports yesterday while he was working on restoring a car he developed substernal chest pressure that was intense. He took an aspirin and drank a carbonated beverage which greatly reduced his pain. However his pain never completely resolved and he went to bed. This morning he woke up and still noted low-grade pain, drove to [place] where he helped move furniture for a friend. He had no escalation of pain with this physical activity but he was more short of breath than usual. He did have some numbness in his left arm but no diaphoresis, nausea, vomiting, neck, jaw or left arm pain. He does have a history of GERD which is well controlled with Zantac. He also has a history of COPD but only infrequently needs to use albuterol. He describes himself as being fairly active and his activities are minimally hindered by shortness of breath. He has had no cardiac symptoms since 2004.  Currently he appears well, is pain-free. Vitals are stable and unremarkable. Cardiovascular regular rate and rhythm. Respiratory clear to auscultation bilaterally. Exam is nonfocal. Troponin is negative, screening laboratory studies unremarkable, chest x-ray negative. EKG showed normal sinus rhythm with no acute changes. Treatment in the emergency department consisted of aspirin, nitroglycerin sublingual x3, nitroglycerin paste.  Impression: Chest pain with both typical and atypical features. Heavy lifting today  did not worsen pain. Currently pain-free. This pain was present more than 24 hours and with negative troponin, normal EKG and negative workup thus far is reassuring. He does report pain was somewhat similar to 2004, however at that time he had borderline positive troponin and had had a severe episode of chest pain earlier the same day. We discussed admission versus transfer to [facility]. Heart score equals 4 and troponin is negative. Given the fact that he is pain-free and initial workup is negative it is reasonable to observe overnight on telemetry, serial cardiac enzymes. Continue aspirin, beta blocker. Nitroglycerin. If pain recurs, would consider heparin and cardiology consultation but at this point ACS is less likely.  Discussed above with family at bedside including son.   Brendia Sacks, MD [facility] [phone]

## 2013-03-08 ENCOUNTER — Encounter (HOSPITAL_COMMUNITY)
Admission: EM | Disposition: A | Discharge status: Home / Self Care | Payer: Self-pay | Source: Home / Self Care | Attending: Emergency Medicine

## 2013-03-08 ENCOUNTER — Observation Stay (HOSPITAL_COMMUNITY): Payer: Medicare Other

## 2013-03-08 DIAGNOSIS — R079 Chest pain, unspecified: Secondary | ICD-10-CM

## 2013-03-08 DIAGNOSIS — R072 Precordial pain: Secondary | ICD-10-CM

## 2013-03-08 DIAGNOSIS — R0989 Other specified symptoms and signs involving the circulatory and respiratory systems: Secondary | ICD-10-CM

## 2013-03-08 HISTORY — PX: LEFT HEART CATHETERIZATION WITH CORONARY ANGIOGRAM: SHX5451

## 2013-03-08 LAB — CBC
Hemoglobin: 16.5 g/dL (ref 13.0–17.0)
MCH: 29.7 pg (ref 26.0–34.0)
MCHC: 32.9 g/dL (ref 30.0–36.0)
MCV: 90.3 fL (ref 78.0–100.0)
RBC: 5.56 MIL/uL (ref 4.22–5.81)

## 2013-03-08 LAB — BASIC METABOLIC PANEL
BUN: 18 mg/dL (ref 6–23)
CO2: 30 mEq/L (ref 19–32)
Calcium: 9.2 mg/dL (ref 8.4–10.5)
GFR calc non Af Amer: 90 mL/min — ABNORMAL LOW (ref >90)
Glucose, Bld: 125 mg/dL — ABNORMAL HIGH (ref 70–99)
Potassium: 4.2 mEq/L (ref 3.5–5.1)

## 2013-03-08 LAB — TSH: TSH: 3.166 u[IU]/mL (ref 0.350–4.500)

## 2013-03-08 LAB — HEMOGLOBIN A1C: Hgb A1c MFr Bld: 6.2 % — ABNORMAL HIGH (ref <5.7)

## 2013-03-08 LAB — TROPONIN I: Troponin I: <0.3 ng/mL (ref <0.30)

## 2013-03-08 SURGERY — LEFT HEART CATHETERIZATION WITH CORONARY ANGIOGRAM
Anesthesia: LOCAL

## 2013-03-08 MED ORDER — SODIUM CHLORIDE 0.9 % IV SOLN: 250.0000 mL | INTRAVENOUS | Status: DC | PRN

## 2013-03-08 MED ORDER — FENTANYL CITRATE 0.05 MG/ML IJ SOLN
INTRAMUSCULAR | Status: AC
  Filled 2013-03-08: qty 2

## 2013-03-08 MED ORDER — ALBUTEROL SULFATE (5 MG/ML) 0.5% IN NEBU: 2.5000 mg | INHALATION_SOLUTION | Freq: Four times a day (QID) | RESPIRATORY_TRACT | Status: DC | PRN

## 2013-03-08 MED ORDER — SODIUM CHLORIDE 0.9 % IJ SOLN: 3.0000 mL | Freq: Two times a day (BID) | INTRAMUSCULAR | Status: DC

## 2013-03-08 MED ORDER — SODIUM CHLORIDE 0.9 % IV SOLN
1.0000 mL/kg/h | INTRAVENOUS | Status: DC
  Administered 2013-03-08: 1 mL/kg/h via INTRAVENOUS

## 2013-03-08 MED ORDER — MIDAZOLAM HCL 2 MG/2ML IJ SOLN
INTRAMUSCULAR | Status: AC
  Filled 2013-03-08: qty 2

## 2013-03-08 MED ORDER — ASPIRIN 81 MG PO CHEW
324.0000 mg | CHEWABLE_TABLET | ORAL | Status: AC
  Administered 2013-03-08: 324 mg via ORAL
  Filled 2013-03-08: qty 4

## 2013-03-08 MED ORDER — VERAPAMIL HCL 2.5 MG/ML IV SOLN
INTRAVENOUS | Status: AC
  Filled 2013-03-08: qty 2

## 2013-03-08 MED ORDER — SODIUM CHLORIDE 0.9 % IV SOLN: INTRAVENOUS | Status: AC

## 2013-03-08 MED ORDER — PANTOPRAZOLE SODIUM 40 MG PO TBEC
40.0000 mg | DELAYED_RELEASE_TABLET | Freq: Every day | ORAL | Status: DC
  Administered 2013-03-08 – 2013-03-09 (×2): 40 mg via ORAL
  Filled 2013-03-08 (×2): qty 1

## 2013-03-08 MED ORDER — SODIUM CHLORIDE 0.9 % IJ SOLN: 3.0000 mL | INTRAMUSCULAR | Status: DC | PRN

## 2013-03-08 MED ORDER — HEPARIN SODIUM (PORCINE) 1000 UNIT/ML IJ SOLN
INTRAMUSCULAR | Status: AC
  Filled 2013-03-08: qty 1

## 2013-03-08 MED ORDER — ASPIRIN 81 MG PO CHEW
81.0000 mg | CHEWABLE_TABLET | Freq: Every day | ORAL | Status: DC
  Administered 2013-03-09: 81 mg via ORAL
  Filled 2013-03-08: qty 1

## 2013-03-08 NOTE — Progress Notes (Signed)
*  PRELIMINARY RESULTS* Vascular Ultrasound Carotid Duplex (Doppler) has been completed.  Preliminary findings: Bilateral:  Less than 39% ICA stenosis.  Vertebral artery flow is antegrade.      Farrel Demark, RDMS, RVT  03/08/2013, 1:26 PM

## 2013-03-08 NOTE — Progress Notes (Signed)
*  PRELIMINARY RESULTS* Echocardiogram 2D Echocardiogram has been performed.  Carlos Goodwin 03/08/2013, 9:30 AM

## 2013-03-08 NOTE — Consult Note (Signed)
Reason for Consult:chest pain Referring Physician: Hawkins  Carlos Goodwin is an 61 y.o. male.  HPI: This is a 61-year-old white male patient who was admitted through the emergency room last night with chest pain. 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 He had mild LV dysfunction ejection fraction 50%. He also has hyperlipidemia and continues to smoke 1ppd. He has not been seen in followup in many years by us.  2days ago while working on restoring a car he developed severe, heavy chest pain with tingling in his left arm similar to pain he had with his MI in 2004. He stopped working, went home and took an aspirin. Pain eased after 2 hrs. When he awakened yesterday he felt the heaviness again but not as severe. He helped a friend move some boxes. The pain didn't worsen, but he became dizzy. He proceeded to ER where pain eased with NTG. Cardiac enzymes and EKG's normal. He's been pain free since. He denies and pain like this over the past 10 yrs.  Past Medical History  Diagnosis Date  . MI (myocardial infarction)   . COPD (chronic obstructive pulmonary disease)   . Coronary artery disease   . Sleep apnea   . Hypertension   . High cholesterol     Past Surgical History  Procedure Laterality Date  . Appendectomy      Family History  Problem Relation Age of Onset  . Diabetes Mother   . Cancer Father     Social History:  reports that he has been smoking Cigarettes.  He has a 50 pack-year smoking history. He does not have any smokeless tobacco history on file. He reports that he does not drink alcohol or use illicit drugs.  Allergies:  Allergies  Allergen Reactions  . Other Rash    States that in 2004 he was placed on a blood thinner that made him break out in a rash.?plavix     Medications:  Scheduled Meds: . albuterol  2.5 mg Nebulization Q6H  . aspirin  325 mg Oral Daily  . enoxaparin (LOVENOX) injection  40 mg Subcutaneous Q24H  .  famotidine  10 mg Oral Daily  . ipratropium  0.5 mg Nebulization Q6H  . metoprolol succinate  100 mg Oral Daily  . pneumococcal 23 valent vaccine  0.5 mL Intramuscular Tomorrow-1000  . senna  1 tablet Oral BID  . simvastatin  40 mg Oral QPM  . sodium chloride  3 mL Intravenous Q12H  . sodium chloride  3 mL Intravenous Q12H  . tiotropium  18 mcg Inhalation Daily   Continuous Infusions:  PRN Meds:.sodium chloride, acetaminophen, acetaminophen, alum & mag hydroxide-simeth, bisacodyl, HYDROcodone-acetaminophen, magnesium hydroxide, morphine injection, nitroGLYCERIN, ondansetron (ZOFRAN) IV, ondansetron, sodium chloride, traZODone   Results for orders placed during the hospital encounter of 03/07/13 (from the past 48 hour(s))  CBC     Status: None   Collection Time    03/07/13  2:00 PM      Result Value Range   WBC 10.2  4.0 - 10.5 K/uL   RBC 5.55  4.22 - 5.81 MIL/uL   Hemoglobin 16.7  13.0 - 17.0 g/dL   HCT 49.2  39.0 - 52.0 %   MCV 88.6  78.0 - 100.0 fL   MCH 30.1  26.0 - 34.0 pg   MCHC 33.9  30.0 - 36.0 g/dL   RDW 14.6  11.5 - 15.5 %   Platelets 211    150 - 400 K/uL  BASIC METABOLIC PANEL     Status: Abnormal   Collection Time    03/07/13  2:00 PM      Result Value Range   Sodium 138  135 - 145 mEq/L   Potassium 4.1  3.5 - 5.1 mEq/L   Chloride 101  96 - 112 mEq/L   CO2 31  19 - 32 mEq/L   Glucose, Bld 93  70 - 99 mg/dL   BUN 17  6 - 23 mg/dL   Creatinine, Ser 0.95  0.50 - 1.35 mg/dL   Calcium 9.4  8.4 - 10.5 mg/dL   GFR calc non Af Amer 87 (*) >90 mL/min   GFR calc Af Amer >90  >90 mL/min   Comment:            The eGFR has been calculated     using the CKD EPI equation.     This calculation has not been     validated in all clinical     situations.     eGFR's persistently     <90 mL/min signify     possible Chronic Kidney Disease.  PRO B NATRIURETIC PEPTIDE     Status: Abnormal   Collection Time    03/07/13  2:00 PM      Result Value Range   Pro B Natriuretic  peptide (BNP) 185.9 (*) 0 - 125 pg/mL  TROPONIN I     Status: None   Collection Time    03/07/13  2:00 PM      Result Value Range   Troponin I <0.30  <0.30 ng/mL   Comment:            Due to the release kinetics of cTnI,     a negative result within the first hours     of the onset of symptoms does not rule out     myocardial infarction with certainty.     If myocardial infarction is still suspected,     repeat the test at appropriate intervals.  LIPID PANEL     Status: Abnormal   Collection Time    03/07/13  2:00 PM      Result Value Range   Cholesterol 190  0 - 200 mg/dL   Triglycerides 138  <150 mg/dL   HDL 37 (*) >39 mg/dL   Total CHOL/HDL Ratio 5.1     VLDL 28  0 - 40 mg/dL   LDL Cholesterol 125 (*) 0 - 99 mg/dL   Comment:            Total Cholesterol/HDL:CHD Risk     Coronary Heart Disease Risk Table                         Men   Women      1/2 Average Risk   3.4   3.3      Average Risk       5.0   4.4      2 X Average Risk   9.6   7.1      3 X Average Risk  23.4   11.0                Use the calculated Patient Ratio     above and the CHD Risk Table     to determine the patient's CHD Risk.                ATP   III CLASSIFICATION (LDL):      <100     mg/dL   Optimal      100-129  mg/dL   Near or Above                        Optimal      130-159  mg/dL   Borderline      160-189  mg/dL   High      >190     mg/dL   Very High  TSH     Status: None   Collection Time    03/07/13  2:00 PM      Result Value Range   TSH 3.166  0.350 - 4.500 uIU/mL  TROPONIN I     Status: None   Collection Time    03/07/13  6:53 PM      Result Value Range   Troponin I <0.30  <0.30 ng/mL   Comment:            Due to the release kinetics of cTnI,     a negative result within the first hours     of the onset of symptoms does not rule out     myocardial infarction with certainty.     If myocardial infarction is still suspected,     repeat the test at appropriate intervals.  TROPONIN I      Status: None   Collection Time    03/08/13 12:00 AM      Result Value Range   Troponin I <0.30  <0.30 ng/mL   Comment:            Due to the release kinetics of cTnI,     a negative result within the first hours     of the onset of symptoms does not rule out     myocardial infarction with certainty.     If myocardial infarction is still suspected,     repeat the test at appropriate intervals.  BASIC METABOLIC PANEL     Status: Abnormal   Collection Time    03/08/13  5:19 AM      Result Value Range   Sodium 141  135 - 145 mEq/L   Potassium 4.2  3.5 - 5.1 mEq/L   Chloride 105  96 - 112 mEq/L   CO2 30  19 - 32 mEq/L   Glucose, Bld 125 (*) 70 - 99 mg/dL   BUN 18  6 - 23 mg/dL   Creatinine, Ser 0.88  0.50 - 1.35 mg/dL   Calcium 9.2  8.4 - 10.5 mg/dL   GFR calc non Af Amer 90 (*) >90 mL/min   GFR calc Af Amer >90  >90 mL/min   Comment:            The eGFR has been calculated     using the CKD EPI equation.     This calculation has not been     validated in all clinical     situations.     eGFR's persistently     <90 mL/min signify     possible Chronic Kidney Disease.  CBC     Status: None   Collection Time    03/08/13  5:19 AM      Result Value Range   WBC 9.8  4.0 - 10.5 K/uL   RBC 5.56  4.22 - 5.81 MIL/uL   Hemoglobin 16.5  13.0 - 17.0 g/dL   HCT 50.2  39.0 -   52.0 %   MCV 90.3  78.0 - 100.0 fL   MCH 29.7  26.0 - 34.0 pg   MCHC 32.9  30.0 - 36.0 g/dL   RDW 14.9  11.5 - 15.5 %   Platelets 189  150 - 400 K/uL  TROPONIN I     Status: None   Collection Time    03/08/13  5:19 AM      Result Value Range   Troponin I <0.30  <0.30 ng/mL   Comment:            Due to the release kinetics of cTnI,     a negative result within the first hours     of the onset of symptoms does not rule out     myocardial infarction with certainty.     If myocardial infarction is still suspected,     repeat the test at appropriate intervals.    Dg Chest Port 1 View  03/07/2013    *RADIOLOGY REPORT*  Clinical Data: Chest pain  PORTABLE CHEST - 1 VIEW  Comparison: 07/03/2009  Findings: Study is limited by poor inspiration and patient's large body habitus.  Cardiomegaly again noted.  No acute infiltrate or pulmonary edema.  Stable calcified pleural plaques and left basilar scarring.  IMPRESSION: Limited study by poor inspiration and patient's large body habitus. Stable calcified pleural plaques and left basilar scarring. Cardiomegaly again noted.  No acute infiltrate or pulmonary edema.   Original Report Authenticated By: Liviu Pop, M.D.    ROS See HPI missing bottom front teeth. Eyes: Negative Ears:Negative for hearing loss, tinnitus Cardiovascular: Negative for palpitations,irregular heartbeat, dyspnea, dyspnea on exertion, near-syncope, orthopnea, paroxysmal nocturnal dyspnea and syncope,edema, claudication, cyanosis,.  Respiratory:   Chronic wheezing and on CPAPNegative for hemoptysis, Endocrine: Negative for cold intolerance and heat intolerance.  Hematologic/Lymphatic: Negative for adenopathy and bleeding problem. Does not bruise/bleed easily.  Musculoskeletal: Negative.   Gastrointestinal: Negative for nausea, vomiting, reflux, abdominal pain, diarrhea, constipation.   Genitourinary: Negative for bladder incontinence, dysuria, flank pain, frequency, hematuria, hesitancy, nocturia and urgency.  Neurological: Negative.  Allergic/Immunologic: Negative for environmental allergies.  Blood pressure 128/70, pulse 70, temperature 97.5 F (36.4 C), temperature source Oral, resp. rate 20, height 5' 10" (1.778 m), weight 272 lb (123.378 kg), SpO2 95.00%. Physical Exam PHYSICAL EXAM: Well-nournished, in no acute distress. Neck: left carotid bruit,No JVD, HJR, or thyroid enlargement Lungs: Decreased breath sounds with inspiratory wheezing anteriorly Cardiovascular: RRR, PMI not displaced, positive S4, no bruit, thrill, or heave. Abdomen: BS normal. Soft without organomegaly,  masses, lesions or tenderness. Extremities: without cyanosis, clubbing or edema. Good distal pulses bilateral SKin: Warm, no lesions or rashes  Musculoskeletal: No deformities Neuro: no focal signs   EKG: NSR, no acute change  Assessment/Plan: 1 Chest pain MI ruled out with negative enzymes and normal EKG. Worrisome for ischemia. Transfer to [facility] for cardiac cath. 2 Coronary artery disease status post non-ST elevation MI treated with PCI to the LAD in 2004 3 Hyperlipidemia 4 History of tobacco abuse. Smoking cessation discussed with patient. 5 COPD on CPAP 6 Carotid bruit. Check carotid dopplers Michele Lenze 03/08/2013, 8:38 AM     I have taken a history, reviewed medications, allergies, PMH, SH, FH, and reviewed ROS and examined the patient.  I agree with the assessment and plan. Spoke to patient and wife. They agree with transfer to [facility] for cardiac catheterization. All questions answered.  Karyna Bessler C. Enisa Runyan, MD, FACC [facility] Pager:  [phone] 

## 2013-03-08 NOTE — Interval H&P Note (Signed)
History and Physical Interval Note:  03/08/2013 4:03 PM  Carlos Goodwin  has presented today for cardiac cath with the diagnosis of CAD/cp.   The various methods of treatment have been discussed with the patient and family. After consideration of risks, benefits and other options for treatment, the patient has consented to  Procedure(s): LEFT HEART CATHETERIZATION WITH CORONARY ANGIOGRAM (N/A) as a surgical intervention .  The patient's history has been reviewed, patient examined, no change in status, stable for surgery.  I have reviewed the patient's chart and labs.  Questions were answered to the patient's satisfaction.    Cath Lab Visit (complete for each Cath Lab visit)  Clinical Evaluation Leading to the Procedure:   ACS: no  Non-ACS:    Anginal Classification: CCS III  Anti-ischemic medical therapy: Minimal Therapy (1 class of medications)  Non-Invasive Test Results: No non-invasive testing performed  Prior CABG: No previous CABG        MCALHANY,CHRISTOPHER

## 2013-03-08 NOTE — Progress Notes (Signed)
UR Chart Review Completed  

## 2013-03-08 NOTE — Progress Notes (Addendum)
Pt transferring to [facility] for cardiac cath this afternoon. Room has been assigned, carelink has been called, report was attempted to be given but nurse was busy at time and will call back. Pt is in NAD, will continue to monitor.

## 2013-03-08 NOTE — Progress Notes (Signed)
Right radial band removed without complications.  Site cleaned, 2x2 and tegaderm applied.  Radial site level 0 with no bruising or hematoma.  Patient given radial instructions verbally.  Reverse Allens Test positive with good blood flow.  Colman Cater

## 2013-03-08 NOTE — Progress Notes (Signed)
Report has been called to receiving nurse at [facility]. Pt left floor via carelink and was alert and oriented and in NAD.

## 2013-03-08 NOTE — Progress Notes (Signed)
Subjective: He was admitted yesterday with chest discomfort. 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 He has ruled out for myocardial infarction.  Objective: Vital signs in last 24 hours: Temp:  [97.5 F (36.4 C)-98.4 F (36.9 C)] 97.5 F (36.4 C) (07/30 0620) Pulse Rate:  [64-77] 70 (07/30 0620) Resp:  [16-23] 20 (07/30 0620) BP: (110-163)/(61-95) 128/70 mmHg (07/30 0620) SpO2:  [92 %-97 %] 95 % (07/30 0620) Weight:  [123.378 kg (272 lb)] 123.378 kg (272 lb) (07/29 1410) Weight change:  Last BM Date: 03/07/13  Intake/Output from previous day: 07/29 0701 - 07/30 0700 In: 3 [I.V.:3] Out: -   PHYSICAL EXAM General appearance: alert, cooperative, mild distress and morbidly obese Resp: clear to auscultation bilaterally Cardio: regular rate and rhythm, S1, S2 normal, no murmur, click, rub or gallop GI: soft, non-tender; bowel sounds normal; no masses,  no organomegaly Extremities: extremities normal, atraumatic, no cyanosis or edema  Lab Results:    Basic Metabolic Panel:  Recent Labs  47/82/95 1400 03/08/13 0519  NA 138 141  K 4.1 4.2  CL 101 105  CO2 31 30  GLUCOSE 93 125*  BUN 17 18  CREATININE 0.95 0.88  CALCIUM 9.4 9.2   Liver Function Tests: No results found for this basename: AST, ALT, ALKPHOS, BILITOT, PROT, ALBUMIN,  in the last 72 hours No results found for this basename: LIPASE, AMYLASE,  in the last 72 hours No results found for this basename: AMMONIA,  in the last 72 hours CBC:  Recent Labs  03/07/13 1400 03/08/13 0519  WBC 10.2 9.8  HGB 16.7 16.5  HCT 49.2 50.2  MCV 88.6 90.3  PLT 211 189   Cardiac Enzymes:  Recent Labs  03/07/13 1853 03/08/13 03/08/13 0519  TROPONINI <0.30 <0.30 <0.30   BNP:  Recent Labs  03/07/13 1400  PROBNP 185.9*   D-Dimer: No results found for this basename: DDIMER,  in the last 72 hours CBG: No results found for this basename: GLUCAP,  in the last 72 hours Hemoglobin  A1C: No results found for this basename: HGBA1C,  in the last 72 hours Fasting Lipid Panel:  Recent Labs  03/07/13 1400  CHOL 190  HDL 37*  LDLCALC 125*  TRIG 138  CHOLHDL 5.1   Thyroid Function Tests:  Recent Labs  03/07/13 1400  TSH 3.166   Anemia Panel: No results found for this basename: VITAMINB12, FOLATE, FERRITIN, TIBC, IRON, RETICCTPCT,  in the last 72 hours Coagulation: No results found for this basename: LABPROT, INR,  in the last 72 hours Urine Drug Screen: Drugs of Abuse  No results found for this basename: labopia, cocainscrnur, labbenz, amphetmu, thcu, labbarb    Alcohol Level: No results found for this basename: ETH,  in the last 72 hours Urinalysis: No results found for this basename: COLORURINE, APPERANCEUR, LABSPEC, PHURINE, GLUCOSEU, HGBUR, BILIRUBINUR, KETONESUR, PROTEINUR, UROBILINOGEN, NITRITE, LEUKOCYTESUR,  in the last 72 hours Misc. Labs:  ABGS No results found for this basename: PHART, PCO2, PO2ART, TCO2, HCO3,  in the last 72 hours CULTURES No results found for this or any previous visit (from the past 240 hour(s)). Studies/Results: Dg Chest Port 1 View  03/07/2013   *RADIOLOGY REPORT*  Clinical Data: Chest pain  PORTABLE CHEST - 1 VIEW  Comparison: 07/03/2009  Findings: Study is limited by poor inspiration and patient's large body habitus.  Cardiomegaly again noted.  No acute infiltrate or pulmonary edema.  Stable calcified pleural plaques and left basilar scarring.  IMPRESSION: Limited study by poor inspiration and patient's large body habitus. Stable calcified pleural plaques and left basilar scarring. Cardiomegaly again noted.  No acute infiltrate or pulmonary edema.   Original Report Authenticated By: Natasha Mead, M.D.    Medications:  Prior to Admission:  Prescriptions prior to admission  Medication Sig Dispense Refill  . aspirin EC 325 MG tablet Take 325 mg by mouth daily.      . metoprolol succinate (TOPROL-XL) 100 MG 24 hr tablet Take  100 mg by mouth daily. Take with or immediately following a meal.      . ranitidine (ZANTAC) 150 MG tablet Take 150 mg by mouth 2 (two) times daily.      . simvastatin (ZOCOR) 40 MG tablet Take 40 mg by mouth every evening.      . tiotropium (SPIRIVA) 18 MCG inhalation capsule Place 18 mcg into inhaler and inhale daily.       Scheduled: . albuterol  2.5 mg Nebulization Q6H  . aspirin  325 mg Oral Daily  . enoxaparin (LOVENOX) injection  40 mg Subcutaneous Q24H  . famotidine  10 mg Oral Daily  . ipratropium  0.5 mg Nebulization Q6H  . metoprolol succinate  100 mg Oral Daily  . pneumococcal 23 valent vaccine  0.5 mL Intramuscular Tomorrow-1000  . senna  1 tablet Oral BID  . simvastatin  40 mg Oral QPM  . sodium chloride  3 mL Intravenous Q12H  . sodium chloride  3 mL Intravenous Q12H  . tiotropium  18 mcg Inhalation Daily   Continuous:  ZOX:WRUEAV chloride, acetaminophen, acetaminophen, alum & mag hydroxide-simeth, bisacodyl, HYDROcodone-acetaminophen, magnesium hydroxide, morphine injection, nitroGLYCERIN, ondansetron (ZOFRAN) IV, ondansetron, sodium chloride, traZODone  Assesment: He was admitted with chest discomfort. He has ruled out for myocardial infarction and had the chest discomfort for about 24 hours. The worst of it lasted about 4 hours. He says this is similar to what he had when he had a myocardial infarction about 10 years ago. He has multiple other medical problems including COPD which is pretty severe, sleep apnea for which he uses CPAP hypertension which is pre-well controlled right now, hyperlipidemia and he just had lipids checked in my office and they were still up. He also has elevated PSA and that was being worked up when he developed chest pain Principal Problem:   Chest pain Active Problems:   COPD (chronic obstructive pulmonary disease)   Coronary artery disease   Sleep apnea   Hypertension   High cholesterol   Obesity   Tobacco abuse    Plan: He'll have  cardiology consult. He will continue treatments. I'm going to order a d-dimer    LOS: 1 day   Williette Loewe L 03/08/2013, 8:37 AM

## 2013-03-08 NOTE — H&P (View-Only) (Signed)
Reason for Consult:chest pain Referring Physician: Reynoldo Goodwin is an 63 y.o. male.  HPI: This is a 62 year old white male patient who was admitted through the emergency room last night with chest pain. 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 He had mild LV dysfunction ejection fraction 50%. He also has hyperlipidemia and continues to smoke 1ppd. He has not been seen in followup in many years by [place].  2days ago while working on restoring a car he developed severe, heavy chest pain with tingling in his left arm similar to pain he had with his MI in 2004. He stopped working, went home and took an aspirin. Pain eased after 2 hrs. When he awakened yesterday he felt the heaviness again but not as severe. He helped a friend move some boxes. The pain didn't worsen, but he became dizzy. He proceeded to ER where pain eased with NTG. Cardiac enzymes and EKG's normal. He's been pain free since. He denies and pain like this over the past 10 yrs.  Past Medical History  Diagnosis Date  . MI (myocardial infarction)   . COPD (chronic obstructive pulmonary disease)   . Coronary artery disease   . Sleep apnea   . Hypertension   . High cholesterol     Past Surgical History  Procedure Laterality Date  . Appendectomy      Family History  Problem Relation Age of Onset  . Diabetes Mother   . Cancer Father     Social History:  reports that he has been smoking Cigarettes.  He has a 50 pack-year smoking history. He does not have any smokeless tobacco history on file. He reports that he does not drink alcohol or use illicit drugs.  Allergies:  Allergies  Allergen Reactions  . Other Rash    States that in 2004 he was placed on a blood thinner that made him break out in a rash.?plavix     Medications:  Scheduled Meds: . albuterol  2.5 mg Nebulization Q6H  . aspirin  325 mg Oral Daily  . enoxaparin (LOVENOX) injection  40 mg Subcutaneous Q24H  .  famotidine  10 mg Oral Daily  . ipratropium  0.5 mg Nebulization Q6H  . metoprolol succinate  100 mg Oral Daily  . pneumococcal 23 valent vaccine  0.5 mL Intramuscular Tomorrow-1000  . senna  1 tablet Oral BID  . simvastatin  40 mg Oral QPM  . sodium chloride  3 mL Intravenous Q12H  . sodium chloride  3 mL Intravenous Q12H  . tiotropium  18 mcg Inhalation Daily   Continuous Infusions:  PRN Meds:.sodium chloride, acetaminophen, acetaminophen, alum & mag hydroxide-simeth, bisacodyl, HYDROcodone-acetaminophen, magnesium hydroxide, morphine injection, nitroGLYCERIN, ondansetron (ZOFRAN) IV, ondansetron, sodium chloride, traZODone   Results for orders placed during the hospital encounter of 03/07/13 (from the past 48 hour(s))  CBC     Status: None   Collection Time    03/07/13  2:00 PM      Result Value Range   WBC 10.2  4.0 - 10.5 K/uL   RBC 5.55  4.22 - 5.81 MIL/uL   Hemoglobin 16.7  13.0 - 17.0 g/dL   HCT 16.1  09.6 - 04.5 %   MCV 88.6  78.0 - 100.0 fL   MCH 30.1  26.0 - 34.0 pg   MCHC 33.9  30.0 - 36.0 g/dL   RDW 40.9  81.1 - 91.4 %   Platelets 211  150 - 400 K/uL  BASIC METABOLIC PANEL     Status: Abnormal   Collection Time    03/07/13  2:00 PM      Result Value Range   Sodium 138  135 - 145 mEq/L   Potassium 4.1  3.5 - 5.1 mEq/L   Chloride 101  96 - 112 mEq/L   CO2 31  19 - 32 mEq/L   Glucose, Bld 93  70 - 99 mg/dL   BUN 17  6 - 23 mg/dL   Creatinine, Ser 1.61  0.50 - 1.35 mg/dL   Calcium 9.4  8.4 - 09.6 mg/dL   GFR calc non Af Amer 87 (*) >90 mL/min   GFR calc Af Amer >90  >90 mL/min   Comment:            The eGFR has been calculated     using the CKD EPI equation.     This calculation has not been     validated in all clinical     situations.     eGFR's persistently     <90 mL/min signify     possible Chronic Kidney Disease.  PRO B NATRIURETIC PEPTIDE     Status: Abnormal   Collection Time    03/07/13  2:00 PM      Result Value Range   Pro B Natriuretic  peptide (BNP) 185.9 (*) 0 - 125 pg/mL  TROPONIN I     Status: None   Collection Time    03/07/13  2:00 PM      Result Value Range   Troponin I <0.30  <0.30 ng/mL   Comment:            Due to the release kinetics of cTnI,     a negative result within the first hours     of the onset of symptoms does not rule out     myocardial infarction with certainty.     If myocardial infarction is still suspected,     repeat the test at appropriate intervals.  LIPID PANEL     Status: Abnormal   Collection Time    03/07/13  2:00 PM      Result Value Range   Cholesterol 190  0 - 200 mg/dL   Triglycerides 045  <409 mg/dL   HDL 37 (*) >81 mg/dL   Total CHOL/HDL Ratio 5.1     VLDL 28  0 - 40 mg/dL   LDL Cholesterol 191 (*) 0 - 99 mg/dL   Comment:            Total Cholesterol/HDL:CHD Risk     Coronary Heart Disease Risk Table                         Men   Women      1/2 Average Risk   3.4   3.3      Average Risk       5.0   4.4      2 X Average Risk   9.6   7.1      3 X Average Risk  23.4   11.0                Use the calculated Patient Ratio     above and the CHD Risk Table     to determine the patient's CHD Risk.                ATP  III CLASSIFICATION (LDL):      <100     mg/dL   Optimal      096-045  mg/dL   Near or Above                        Optimal      130-159  mg/dL   Borderline      409-811  mg/dL   High      >914     mg/dL   Very High  TSH     Status: None   Collection Time    03/07/13  2:00 PM      Result Value Range   TSH 3.166  0.350 - 4.500 uIU/mL  TROPONIN I     Status: None   Collection Time    03/07/13  6:53 PM      Result Value Range   Troponin I <0.30  <0.30 ng/mL   Comment:            Due to the release kinetics of cTnI,     a negative result within the first hours     of the onset of symptoms does not rule out     myocardial infarction with certainty.     If myocardial infarction is still suspected,     repeat the test at appropriate intervals.  TROPONIN I      Status: None   Collection Time    03/08/13 12:00 AM      Result Value Range   Troponin I <0.30  <0.30 ng/mL   Comment:            Due to the release kinetics of cTnI,     a negative result within the first hours     of the onset of symptoms does not rule out     myocardial infarction with certainty.     If myocardial infarction is still suspected,     repeat the test at appropriate intervals.  BASIC METABOLIC PANEL     Status: Abnormal   Collection Time    03/08/13  5:19 AM      Result Value Range   Sodium 141  135 - 145 mEq/L   Potassium 4.2  3.5 - 5.1 mEq/L   Chloride 105  96 - 112 mEq/L   CO2 30  19 - 32 mEq/L   Glucose, Bld 125 (*) 70 - 99 mg/dL   BUN 18  6 - 23 mg/dL   Creatinine, Ser 7.82  0.50 - 1.35 mg/dL   Calcium 9.2  8.4 - 95.6 mg/dL   GFR calc non Af Amer 90 (*) >90 mL/min   GFR calc Af Amer >90  >90 mL/min   Comment:            The eGFR has been calculated     using the CKD EPI equation.     This calculation has not been     validated in all clinical     situations.     eGFR's persistently     <90 mL/min signify     possible Chronic Kidney Disease.  CBC     Status: None   Collection Time    03/08/13  5:19 AM      Result Value Range   WBC 9.8  4.0 - 10.5 K/uL   RBC 5.56  4.22 - 5.81 MIL/uL   Hemoglobin 16.5  13.0 - 17.0 g/dL   HCT 21.3  08.6 -  52.0 %   MCV 90.3  78.0 - 100.0 fL   MCH 29.7  26.0 - 34.0 pg   MCHC 32.9  30.0 - 36.0 g/dL   RDW 19.1  47.8 - 29.5 %   Platelets 189  150 - 400 K/uL  TROPONIN I     Status: None   Collection Time    03/08/13  5:19 AM      Result Value Range   Troponin I <0.30  <0.30 ng/mL   Comment:            Due to the release kinetics of cTnI,     a negative result within the first hours     of the onset of symptoms does not rule out     myocardial infarction with certainty.     If myocardial infarction is still suspected,     repeat the test at appropriate intervals.    Dg Chest Port 1 View  03/07/2013    *RADIOLOGY REPORT*  Clinical Data: Chest pain  PORTABLE CHEST - 1 VIEW  Comparison: 07/03/2009  Findings: Study is limited by poor inspiration and patient's large body habitus.  Cardiomegaly again noted.  No acute infiltrate or pulmonary edema.  Stable calcified pleural plaques and left basilar scarring.  IMPRESSION: Limited study by poor inspiration and patient's large body habitus. Stable calcified pleural plaques and left basilar scarring. Cardiomegaly again noted.  No acute infiltrate or pulmonary edema.   Original Report Authenticated By: Natasha Mead, M.D.    ROS See HPI missing bottom front teeth. Eyes: Negative Ears:Negative for hearing loss, tinnitus Cardiovascular: Negative for palpitations,irregular heartbeat, dyspnea, dyspnea on exertion, near-syncope, orthopnea, paroxysmal nocturnal dyspnea and syncope,edema, claudication, cyanosis,.  Respiratory:   Chronic wheezing and on CPAPNegative for hemoptysis, Endocrine: Negative for cold intolerance and heat intolerance.  Hematologic/Lymphatic: Negative for adenopathy and bleeding problem. Does not bruise/bleed easily.  Musculoskeletal: Negative.   Gastrointestinal: Negative for nausea, vomiting, reflux, abdominal pain, diarrhea, constipation.   Genitourinary: Negative for bladder incontinence, dysuria, flank pain, frequency, hematuria, hesitancy, nocturia and urgency.  Neurological: Negative.  Allergic/Immunologic: Negative for environmental allergies.  Blood pressure 128/70, pulse 70, temperature 97.5 F (36.4 C), temperature source Oral, resp. rate 20, height 5\' 10"  (1.778 m), weight 272 lb (123.378 kg), SpO2 95.00%. Physical Exam PHYSICAL EXAM: Well-nournished, in no acute distress. Neck: left carotid bruit,No JVD, HJR, or thyroid enlargement Lungs: Decreased breath sounds with inspiratory wheezing anteriorly Cardiovascular: RRR, PMI not displaced, positive S4, no bruit, thrill, or heave. Abdomen: BS normal. Soft without organomegaly,  masses, lesions or tenderness. Extremities: without cyanosis, clubbing or edema. Good distal pulses bilateral SKin: Warm, no lesions or rashes  Musculoskeletal: No deformities Neuro: no focal signs   EKG: NSR, no acute change  Assessment/Plan: 1 Chest pain MI ruled out with negative enzymes and normal EKG. Worrisome for ischemia. Transfer to [facility] for cardiac cath. 2 Coronary artery disease status post non-ST elevation MI treated with PCI to the LAD in 2004 3 Hyperlipidemia 4 History of tobacco abuse. Smoking cessation discussed with patient. 5 COPD on CPAP 6 Carotid bruit. Check carotid dopplers Carlos Goodwin 03/08/2013, 8:38 AM     I have taken a history, reviewed medications, allergies, PMH, SH, FH, and reviewed ROS and examined the patient.  I agree with the assessment and plan. Spoke to patient and wife. They agree with transfer to [facility] for cardiac catheterization. All questions answered.  Carlos Verma C. Daleen Squibb, MD, [facility] Pager:  [phone]

## 2013-03-08 NOTE — Progress Notes (Signed)
Patient placed on CPAP pressure of 10 via a full face mask per home settings. Tolerating well at this time. RT will continue to monitor.

## 2013-03-08 NOTE — CV Procedure (Signed)
    Cardiac Catheterization Operative Report  ROMA BIERLEIN [id number] 7/30/20144:40 PM HAWKINS,EDWARD L, MD  Procedure Performed:  1. Left Heart Catheterization 2. Selective Coronary Angiography 3. Left ventricular angiogram  Operator: Verne Carrow, MD  Arterial access site:  Right radial artery.   Indication:  62 yo male with history of CAD, HTN, HLD, OSA, ongoing tobacco abuse admitted with chest pain. He has an LAD stent 2004. Cardiac markers negative.                                     Procedure Details: The risks, benefits, complications, treatment options, and expected outcomes were discussed with the patient. The patient and/or family concurred with the proposed plan, giving informed consent. The patient was brought to the cath lab after IV hydration was begun and oral premedication was given. The patient was further sedated with Versed and Fentanyl. The right wrist was assessed with an Allens test which was positive. The right wrist was prepped and draped in a sterile fashion. 1% lidocaine was used for local anesthesia. Using the modified Seldinger access technique, a 5 French sheath was placed in the right radial artery. 3 mg Verapamil was given through the sheath. 5000 units IV heparin was given. Standard diagnostic catheters were used to perform selective coronary angiography. A pigtail catheter was used to perform a left ventricular angiogram. The sheath was removed from the right radial artery and a Terumo hemostasis band was applied at the arteriotomy site on the right wrist.    There were no immediate complications. The patient was taken to the recovery area in stable condition.   Hemodynamic Findings: Central aortic pressure: 131/73 Left ventricular pressure: 129/16/21  Angiographic Findings:  Left main:  No obstructive disease.   Left Anterior Descending Artery: Large caliber vessel that courses to the apex. The mid LAD stent is patent without restenosis.  Small caliber diagonal branch is patent with mild proximal plaque.   Circumflex Artery: Large, dominant vessel with large bifurcating obtuse marginal branch. There is mild plaque in the distal vessel. No obstructive disease.   Right Coronary Artery: Small, non-dominant vessel with no obstructive disease.   Left Ventricular Angiogram: LVEF=55-60%.   Impression: 1. Stable single vessel CAD with patent mid LAD stent 2. Normal LV systolic function  Recommendations: Continued medical management. His pain could be secondary to GERD. Will stop H2 blocker and start PPI.        Complications:  None. The patient tolerated the procedure well.

## 2013-03-09 DIAGNOSIS — F17200 Nicotine dependence, unspecified, uncomplicated: Secondary | ICD-10-CM

## 2013-03-09 MED ORDER — ASPIRIN 81 MG PO TBEC: 81.0000 mg | DELAYED_RELEASE_TABLET | Freq: Every day | ORAL | Status: DC

## 2013-03-09 MED ORDER — PANTOPRAZOLE SODIUM 40 MG PO TBEC: 40.0000 mg | DELAYED_RELEASE_TABLET | Freq: Every day | ORAL | Status: DC

## 2013-03-09 NOTE — Discharge Summary (Signed)
See full note this am. cdm 

## 2013-03-09 NOTE — Discharge Summary (Signed)
CARDIOLOGY DISCHARGE SUMMARY   Patient ID: Carlos Goodwin MRN: [id number] DOB/AGE: 12-01-1950 62 y.o.  Admit date: 03/07/2013 Discharge date: 03/09/2013  Primary Discharge Diagnosis:     Precordial pain -nonobstructive CAD at cath Secondary Discharge Diagnosis:    COPD (chronic obstructive pulmonary disease)   Coronary artery disease   Sleep apnea   Hypertension   High cholesterol   Obesity   Tobacco abuse    Procedure Performed:  1. Left Heart Catheterization 2. Selective Coronary Angiography 3. Left ventricular angiogram  Hospital Course: Carlos Goodwin is a 62 y.o. male with a history of CAD. He went to [facility] emergency room with chest pain and was admitted for further evaluation and treatment.  His symptoms were concerning for progressive anginal pain. His cardiac enzymes were negative for MI. A lipid profile was reviewed and showed an elevated LDL. Compliance with diet and medications is encouraged. Smoking cessation was discussed and encouraged. His blood sugars were slightly elevated the hemoglobin A1c was performed. It is also slightly elevated and he is encouraged to limit carbohydrates and sugars. He should also followup with his primary M.D. He was continued on his CPAP each bedtime with respiratory therapy assistance.  His symptoms reminded him of his MI symptoms and 2004. Cardiac catheterization was indicated and he was taken to the cath lab on 03/08/2013. Full results are below. He had nonobstructive disease and medical therapy was recommended. Improved cardiac risk factor control was encouraged. The symptoms are possibly secondary to GI and he is to change from an H2 blocker to a proton pump inhibitor.   On 03/09/2013, Carlos Goodwin was seen by Dr. Clifton James. His symptoms had improved and he was ambulating without chest pain or shortness of breath. Dr. Clifton James considered stable for discharge, to followup as an outpatient.  Labs:   Lab Results  Component Value Date     WBC 9.8 03/08/2013   HGB 16.5 03/08/2013   HCT 50.2 03/08/2013   MCV 90.3 03/08/2013   PLT 189 03/08/2013     Recent Labs Lab 03/08/13 0519  NA 141  K 4.2  CL 105  CO2 30  BUN 18  CREATININE 0.88  CALCIUM 9.2  GLUCOSE 125*    Recent Labs  03/07/13 1853 03/08/13 03/08/13 0519  TROPONINI <0.30 <0.30 <0.30   Lipid Panel     Component Value Date/Time   CHOL 190 03/07/2013 1400   TRIG 138 03/07/2013 1400   HDL 37* 03/07/2013 1400   CHOLHDL 5.1 03/07/2013 1400   VLDL 28 03/07/2013 1400   LDLCALC 125* 03/07/2013 1400    Pro B Natriuretic peptide (BNP)  Date/Time Value Range Status  03/07/2013  2:00 PM 185.9* 0 - 125 pg/mL Final    Recent Labs  03/08/13 1454  INR 0.99   Lab Results  Component Value Date   HGBA1C 6.2* 03/08/2013    Radiology: Dg Chest Port 1 View 03/07/2013   *RADIOLOGY REPORT*  Clinical Data: Chest pain  PORTABLE CHEST - 1 VIEW  Comparison: 07/03/2009  Findings: Study is limited by poor inspiration and patient's large body habitus.  Cardiomegaly again noted.  No acute infiltrate or pulmonary edema.  Stable calcified pleural plaques and left basilar scarring.  IMPRESSION: Limited study by poor inspiration and patient's large body habitus. Stable calcified pleural plaques and left basilar scarring. Cardiomegaly again noted.  No acute infiltrate or pulmonary edema.   Original Report Authenticated By: Natasha Mead, M.D.    Cardiac Cath: 03/08/2013 Left main: No  obstructive disease.  Left Anterior Descending Artery: Large caliber vessel that courses to the apex. The mid LAD stent is patent without restenosis. Small caliber diagonal branch is patent with mild proximal plaque.  Circumflex Artery: Large, dominant vessel with large bifurcating obtuse marginal branch. There is mild plaque in the distal vessel. No obstructive disease.  Right Coronary Artery: Small, non-dominant vessel with no obstructive disease.  Left Ventricular Angiogram: LVEF=55-60%.  Impression:   1. Stable single vessel CAD with patent mid LAD stent  2. Normal LV systolic function  Recommendations: Continued medical management. His pain could be secondary to GERD. Will stop H2 blocker and start PPI.   EKG: 03/08/2013 Sinus rhythm, rate 61, no acute ischemic changes and no abnormal intervals noted  Echo: 03/08/2013 Conclusions Left ventricle: The cavity size was normal. Systolic function was normal. The estimated ejection fraction was in the range of 55% to 60%. Wall motion was normal; there were no regional wall motion abnormalities.  FOLLOW UP PLANS AND APPOINTMENTS Allergies  Allergen Reactions  . Other Rash    States that in 2004 he was placed on a blood thinner that made him break out in a rash.?plavix      Medication List    STOP taking these medications       ranitidine 150 MG tablet  Commonly known as:  ZANTAC      TAKE these medications       aspirin 81 MG EC tablet  Take 1 tablet (81 mg total) by mouth daily.     metoprolol succinate 100 MG 24 hr tablet  Commonly known as:  TOPROL-XL  Take 100 mg by mouth daily. Take with or immediately following a meal.     pantoprazole 40 MG tablet  Commonly known as:  PROTONIX  Take 1 tablet (40 mg total) by mouth daily.     simvastatin 40 MG tablet  Commonly known as:  ZOCOR  Take 40 mg by mouth every evening.     tiotropium 18 MCG inhalation capsule  Commonly known as:  SPIRIVA  Place 18 mcg into inhaler and inhale daily.        Discharge Orders   Future Appointments Provider Department Dept Phone   03/24/2013 1:00 PM Jodelle Gross, NP  [facility] [phone]   Future Orders Complete By Expires     Diet - low sodium heart healthy  As directed     Increase activity slowly  As directed       Follow-up Information   Follow up with Joni Reining, NP On 03/24/2013. (at 1:00 pm)    Contact information:   [address] [place] [address] [phone]       BRING  ALL MEDICATIONS WITH YOU TO FOLLOW UP APPOINTMENTS  Time spent with patient to include physician time: 33 min Signed: Theodore Demark, PA-C 03/09/2013, 10:44 AM Co-Sign MD

## 2013-03-09 NOTE — Progress Notes (Signed)
    SUBJECTIVE: No chest pain or SOB.   BP 168/78  Pulse 58  Temp(Src) 97.5 F (36.4 C) (Oral)  Resp 20  Ht 5\' 10"  (1.778 m)  Wt 270 lb 8 oz (122.698 kg)  BMI 38.81 kg/m2  SpO2 90%  Intake/Output Summary (Last 24 hours) at 03/09/13 1610 Last data filed at 03/08/13 2000  Gross per 24 hour  Intake 793.25 ml  Output      0 ml  Net 793.25 ml    PHYSICAL EXAM General: Well developed, well nourished, in no acute distress. Alert and oriented x 3.  Psych:  Good affect, responds appropriately Neck: No JVD. No masses noted.  Lungs: Clear bilaterally with no wheezes or rhonci noted.  Heart: RRR with no murmurs noted. Abdomen: Bowel sounds are present. Soft, non-tender.  Extremities: No lower extremity edema.   LABS: Basic Metabolic Panel:  Recent Labs  96/04/54 1400 03/08/13 0519  NA 138 141  K 4.1 4.2  CL 101 105  CO2 31 30  GLUCOSE 93 125*  BUN 17 18  CREATININE 0.95 0.88  CALCIUM 9.4 9.2   CBC:  Recent Labs  03/07/13 1400 03/08/13 0519  WBC 10.2 9.8  HGB 16.7 16.5  HCT 49.2 50.2  MCV 88.6 90.3  PLT 211 189   Cardiac Enzymes:  Recent Labs  03/07/13 1853 03/08/13 03/08/13 0519  TROPONINI <0.30 <0.30 <0.30   Fasting Lipid Panel:  Recent Labs  03/07/13 1400  CHOL 190  HDL 37*  LDLCALC 125*  TRIG 138  CHOLHDL 5.1    Current Meds: . aspirin  81 mg Oral Daily  . metoprolol succinate  100 mg Oral Daily  . pantoprazole  40 mg Oral Daily  . senna  1 tablet Oral BID  . simvastatin  40 mg Oral QPM  . sodium chloride  3 mL Intravenous Q12H  . tiotropium  18 mcg Inhalation Daily     ASSESSMENT AND PLAN:  1. CAD: Transferred from [facility] for cath yesterday. Recent exertional chest pains. Cardiac cath stable with patent mid LAD stent. NO obstructive disease noted. Continue ASA, beta blocker, statin. I stopped his Zantac and started Protonix for possible GERD related chest pain. Smoking cessation recommended.   2. Dispo: D/C home today. Follow  up in [facility] office. (former [facility] patient).   Carlos Goodwin,Carlos Goodwin  7/31/20147:12 AM

## 2013-03-20 ENCOUNTER — Ambulatory Visit (INDEPENDENT_AMBULATORY_CARE_PROVIDER_SITE_OTHER): Payer: Medicare Other | Admitting: Adult Health

## 2013-03-20 ENCOUNTER — Encounter: Payer: Self-pay | Admitting: Adult Health

## 2013-03-20 VITALS — BP 142/88 | HR 67 | Ht 70.0 in | Wt 276.0 lb

## 2013-03-20 DIAGNOSIS — I10 Essential (primary) hypertension: Secondary | ICD-10-CM

## 2013-03-20 DIAGNOSIS — J449 Chronic obstructive pulmonary disease, unspecified: Secondary | ICD-10-CM

## 2013-03-20 DIAGNOSIS — I2510 Atherosclerotic heart disease of native coronary artery without angina pectoris: Secondary | ICD-10-CM

## 2013-03-20 NOTE — Patient Instructions (Signed)

## 2013-03-20 NOTE — Progress Notes (Deleted)
Name: Carlos Goodwin    DOB: 10-29-1950  Age: 62 y.o.  MR#: [id number]       PCP:  Fredirick Maudlin, MD      Insurance: Payor: [employer] MEDICARE / Plan: AARP MEDICARE COMPLETE / Product Type: *No Product type* /   CC:   No chief complaint on file.   VS Filed Vitals:   03/20/13 1520  BP: 142/88  Pulse: 67  Height: 5\' 10"  (1.778 m)  Weight: 276 lb (125.193 kg)    Weights Current Weight  03/20/13 276 lb (125.193 kg)  03/08/13 270 lb 8 oz (122.698 kg)  03/08/13 270 lb 8 oz (122.698 kg)    Blood Pressure  BP Readings from Last 3 Encounters:  03/20/13 142/88  03/09/13 168/78  03/09/13 168/78     Admit date:  (Not on file) Last encounter with RMR:  Visit date not found   Allergy Other  Current Outpatient Prescriptions  Medication Sig Dispense Refill  . aspirin EC 81 MG EC tablet Take 1 tablet (81 mg total) by mouth daily.  30 tablet  0  . metoprolol succinate (TOPROL-XL) 100 MG 24 hr tablet Take 100 mg by mouth daily. Take with or immediately following a meal.      . simvastatin (ZOCOR) 40 MG tablet Take 40 mg by mouth every evening.      . tiotropium (SPIRIVA) 18 MCG inhalation capsule Place 18 mcg into inhaler and inhale daily.       No current facility-administered medications for this visit.    Discontinued Meds:    Medications Discontinued During This Encounter  Medication Reason  . pantoprazole (PROTONIX) 40 MG tablet Completed Course    Patient Active Problem List   Diagnosis Date Noted  . Precordial pain 03/07/2013  . Obesity 03/07/2013  . Tobacco abuse 03/07/2013  . COPD (chronic obstructive pulmonary disease)   . Coronary artery disease   . Sleep apnea   . Hypertension   . High cholesterol     LABS    Component Value Date/Time   NA 141 03/08/2013 0519   NA 138 03/07/2013 1400   K 4.2 03/08/2013 0519   K 4.1 03/07/2013 1400   CL 105 03/08/2013 0519   CL 101 03/07/2013 1400   CO2 30 03/08/2013 0519   CO2 31 03/07/2013 1400   GLUCOSE 125*  03/08/2013 0519   GLUCOSE 93 03/07/2013 1400   BUN 18 03/08/2013 0519   BUN 17 03/07/2013 1400   CREATININE 0.88 03/08/2013 0519   CREATININE 0.95 03/07/2013 1400   CALCIUM 9.2 03/08/2013 0519   CALCIUM 9.4 03/07/2013 1400   GFRNONAA 90* 03/08/2013 0519   GFRNONAA 87* 03/07/2013 1400   GFRAA >90 03/08/2013 0519   GFRAA >90 03/07/2013 1400   CMP     Component Value Date/Time   NA 141 03/08/2013 0519   K 4.2 03/08/2013 0519   CL 105 03/08/2013 0519   CO2 30 03/08/2013 0519   GLUCOSE 125* 03/08/2013 0519   BUN 18 03/08/2013 0519   CREATININE 0.88 03/08/2013 0519   CALCIUM 9.2 03/08/2013 0519   GFRNONAA 90* 03/08/2013 0519   GFRAA >90 03/08/2013 0519       Component Value Date/Time   WBC 9.8 03/08/2013 0519   WBC 10.2 03/07/2013 1400   HGB 16.5 03/08/2013 0519   HGB 16.7 03/07/2013 1400   HCT 50.2 03/08/2013 0519   HCT 49.2 03/07/2013 1400   MCV 90.3 03/08/2013 0519   MCV 88.6 03/07/2013 1400  Lipid Panel     Component Value Date/Time   CHOL 190 03/07/2013 1400   TRIG 138 03/07/2013 1400   HDL 37* 03/07/2013 1400   CHOLHDL 5.1 03/07/2013 1400   VLDL 28 03/07/2013 1400   LDLCALC 125* 03/07/2013 1400    ABG    Component Value Date/Time   PHART 7.382 11/29/2007 1015   PCO2ART 49.8* 11/29/2007 1015   PO2ART 66.9* 11/29/2007 1015   HCO3 28.9* 11/29/2007 1015   TCO2 24.7 11/29/2007 1015   O2SAT 93.6 11/29/2007 1015     Lab Results  Component Value Date   TSH 3.166 03/07/2013   BNP (last 3 results)  Recent Labs  03/07/13 1400  PROBNP 185.9*   Cardiac Panel (last 3 results) No results found for this basename: CKTOTAL, CKMB, TROPONINI, RELINDX,  in the last 72 hours  Iron/TIBC/Ferritin No results found for this basename: iron, tibc, ferritin     EKG Orders placed in visit on 03/20/13  . EKG 12-LEAD     Prior Assessment and Plan Problem List as of 03/20/2013     Cardiovascular and Mediastinum   Coronary artery disease   Hypertension     Respiratory   COPD (chronic obstructive  pulmonary disease)     Other   Precordial pain   Sleep apnea   High cholesterol   Obesity   Tobacco abuse       Imaging: Dg Chest Port 1 View  03/07/2013   *RADIOLOGY REPORT*  Clinical Data: Chest pain  PORTABLE CHEST - 1 VIEW  Comparison: 07/03/2009  Findings: Study is limited by poor inspiration and patient's large body habitus.  Cardiomegaly again noted.  No acute infiltrate or pulmonary edema.  Stable calcified pleural plaques and left basilar scarring.  IMPRESSION: Limited study by poor inspiration and patient's large body habitus. Stable calcified pleural plaques and left basilar scarring. Cardiomegaly again noted.  No acute infiltrate or pulmonary edema.   Original Report Authenticated By: Natasha Mead, M.D.

## 2013-03-20 NOTE — Assessment & Plan Note (Signed)
Cardiac catheterization was reassuring. There was no progression of CAD. LAD stent was patent. The patient remains medically compliant but continues to eat high cholesterol foods, and continued smoking. We have discussed ongoing cardiac risk with continuation of these behaviors. I have advised him to smoking, and to cut down on cholesterol rich foods. I told them at that as he ages CAD can be progressive, and diet with tobacco abuse can contribute to worsening CAD. He verbalizes understanding.

## 2013-03-20 NOTE — Assessment & Plan Note (Signed)
Currently blood pressure is very well-controlled without evidence of hypertension. He will continue on aspirin and metoprolol. See him again in 6 months unless becomes symptomatic.

## 2013-03-20 NOTE — Assessment & Plan Note (Signed)
I advised the patient on smoking cessation as this can lead to progressive COPD. He verbalizes understanding. He does not appear to wish to quit smoking at this time.

## 2013-03-20 NOTE — Progress Notes (Signed)
   HPI: Mr. Carlos Goodwin is a 62 y/o to Dr. Clifton James, we are following for ongoing assessment and management of CAD with known history of COPD, ongoing tobacco abuse, obstructive sleep apnea, and hypertension. The patient was admitted to [facility] hospital on 03/07/2013 with complaints of chest pain. Cardiac enzymes are found be negative, and cardiac catheterization was completed. This demonstrated stable single-vessel CAD with patent mid LAD stent, normal LV function of 55-60%. He was continued on medical management and risk factor modification.   Unfortunately, the patient has not compliant with a low cholesterol diet and continues to smoke. He is medically compliant. He denies any recurrent chest pain. He was started on PPI.  Allergies  Allergen Reactions  . Other Rash    States that in 2004 he was placed on a blood thinner that made him break out in a rash.?plavix     Current Outpatient Prescriptions  Medication Sig Dispense Refill  . aspirin EC 81 MG EC tablet Take 1 tablet (81 mg total) by mouth daily.  30 tablet  0  . metoprolol succinate (TOPROL-XL) 100 MG 24 hr tablet Take 100 mg by mouth daily. Take with or immediately following a meal.      . simvastatin (ZOCOR) 40 MG tablet Take 40 mg by mouth every evening.      . tiotropium (SPIRIVA) 18 MCG inhalation capsule Place 18 mcg into inhaler and inhale daily.       No current facility-administered medications for this visit.    Past Medical History  Diagnosis Date  . MI (myocardial infarction)   . COPD (chronic obstructive pulmonary disease)   . Coronary artery disease   . Sleep apnea   . Hypertension   . High cholesterol     Past Surgical History  Procedure Laterality Date  . Appendectomy      WUJ:WJXBJY of systems complete and found to be negative unless listed above  PHYSICAL EXAM BP 142/88  Pulse 67  Ht 5\' 10"  (1.778 m)  Wt 276 lb (125.193 kg)  BMI 39.6 kg/m2  General: Well developed, well nourished, obese, in no acute  distress Head: Eyes PERRLA, No xanthomas.   Normal cephalic and atramatic  Lungs: Clear bilaterally to auscultation and percussion. Heart: HRRR S1 S2, without MRG.  Pulses are 2+ & equal.            No carotid bruit. No JVD.  No abdominal bruits. No femoral bruits. Abdomen: Bowel sounds are positive, abdomen soft and non-tender without masses or                  Hernia's noted. Msk:  Back normal, normal gait. Normal strength and tone for age. Right wrist cath insertion site is healthy without evidence of bruising or hematoma Extremities: No clubbing, cyanosis or edema.  DP +1 Neuro: Alert and oriented X 3. Psych:  Good affect, responds appropriately    ASSESSMENT AND PLAN

## 2013-03-24 ENCOUNTER — Encounter: Payer: Medicare Other | Admitting: Adult Health

## 2013-05-31 ENCOUNTER — Telehealth: Payer: Self-pay | Admitting: Adult Health

## 2013-05-31 NOTE — Telephone Encounter (Signed)
Needs okay for patient to stop ASA prior to Biopsy scheduled for 11/4.  Please fax to [phone]. / tgs

## 2013-05-31 NOTE — Telephone Encounter (Signed)
PLEASE ADVISE.

## 2013-06-01 NOTE — Telephone Encounter (Signed)
He can stop ASA if necessary for biopsy, but restart after completed.

## 2013-06-02 NOTE — Telephone Encounter (Signed)
Left message at Dr Frann Rider, also faxed phone note to fax number provided

## 2013-06-05 NOTE — Pre-Procedure Instructions (Signed)
Dr Jayme Cloud aware of labs done 03/08/2013, ok to use these.

## 2013-06-05 NOTE — Patient Instructions (Signed)
Carlos Goodwin  06/05/2013   Your procedure is scheduled on:  06/13/2013  Report to [facility] at  0740 AM.  Call this number if you have problems the morning of surgery: [phone]   Remember:   Do not eat food or drink liquids after midnight.   Take these medicines the morning of surgery with A SIP OF WATER:  Metoprolol. Take your spiriva before you come.   Do not wear jewelry, make-up or nail polish.  Do not wear lotions, powders, or perfumes.   Do not shave 48 hours prior to surgery. Men may shave face and neck.  Do not bring valuables to the hospital.  [facility] is not responsible for any belongings or valuables.               Contacts, dentures or bridgework may not be worn into surgery.  Leave suitcase in the car. After surgery it may be brought to your room.  For patients admitted to the hospital, discharge time is determined by your treatment team.               Patients discharged the day of surgery will not be allowed to drive home.  Name and phone number of your driver: family  Special Instructions: Shower using CHG 2 nights before surgery and the night before surgery.  If you shower the day of surgery use CHG.  Use special wash - you have one bottle of CHG for all showers.  You should use approximately 1/3 of the bottle for each shower.   Please read over the following fact sheets that you were given: Pain Booklet, Coughing and Deep Breathing, Surgical Site Infection Prevention, Anesthesia Post-op Instructions and Care and Recovery After Surgery Prostate Biopsy TRUS Biopsy BEFORE THE TEST   Do not take aspirin. Do not take any medicine that has aspirin in it 7 days before your biopsy.  You may be given a medicine to take on the day of your biopsy.  You may also be given a medicine or treatment to help you go poop (laxative or enema). AFTER THE TEST  Only take medicine as told by your doctor.  It is normal to have some bleeding from your rectum for the  first 5 days.  You may have blood in your pee (urine) or sperm. Finding out the results of your test Ask when your test results will be ready. Make sure you get your test results. GET HELP RIGHT AWAY IF:  You have a temperature by mouth above 102 F (38.9 C), not controlled by medicine.  You have blood in your pee for more than 5 days.  You have a lot of blood in your pee.  You have bleeding from your rectum for more than 5 days or have a lot of blood in your poop (feces).  You have severe pain. Document Released: 07/15/2009 Document Revised: 10/19/2011 Document Reviewed: 07/15/2009 [facility] Patient Information 2014 [place], [place]. PATIENT INSTRUCTIONS POST-ANESTHESIA  IMMEDIATELY FOLLOWING SURGERY:  Do not drive or operate machinery for the first twenty four hours after surgery.  Do not make any important decisions for twenty four hours after surgery or while taking narcotic pain medications or sedatives.  If you develop intractable nausea and vomiting or a severe headache please notify your doctor immediately.  FOLLOW-UP:  Please make an appointment with your surgeon as instructed. You do not need to follow up with anesthesia unless specifically instructed to do so.  WOUND CARE  INSTRUCTIONS (if applicable):  Keep a dry clean dressing on the anesthesia/puncture wound site if there is drainage.  Once the wound has quit draining you may leave it open to air.  Generally you should leave the bandage intact for twenty four hours unless there is drainage.  If the epidural site drains for more than 36-48 hours please call the anesthesia department.  QUESTIONS?:  Please feel free to call your physician or the hospital operator if you have any questions, and they will be happy to assist you.

## 2013-06-06 ENCOUNTER — Encounter (HOSPITAL_COMMUNITY)
Admission: RE | Admit: 2013-06-06 | Discharge: 2013-06-06 | Disposition: A | Discharge status: Home / Self Care | Payer: Medicare Other | Source: Ambulatory Visit | Attending: Urology | Admitting: Urology

## 2013-06-06 ENCOUNTER — Encounter (HOSPITAL_COMMUNITY): Payer: Self-pay | Admitting: Pharmacy Technician

## 2013-06-06 ENCOUNTER — Encounter (HOSPITAL_COMMUNITY): Payer: Self-pay

## 2013-06-06 DIAGNOSIS — Z01812 Encounter for preprocedural laboratory examination: Secondary | ICD-10-CM | POA: Insufficient documentation

## 2013-06-06 DIAGNOSIS — Z01818 Encounter for other preprocedural examination: Secondary | ICD-10-CM | POA: Insufficient documentation

## 2013-06-06 HISTORY — DX: Gastro-esophageal reflux disease without esophagitis: K21.9

## 2013-06-06 LAB — BASIC METABOLIC PANEL
Calcium: 9.3 mg/dL (ref 8.4–10.5)
GFR calc Af Amer: >90 mL/min (ref >90)
GFR calc non Af Amer: 88 mL/min — ABNORMAL LOW (ref >90)
Sodium: 141 mEq/L (ref 135–145)

## 2013-06-06 LAB — HEMOGLOBIN AND HEMATOCRIT, BLOOD
HCT: 50.5 % (ref 39.0–52.0)
Hemoglobin: 16.8 g/dL (ref 13.0–17.0)

## 2013-06-06 NOTE — Telephone Encounter (Signed)
This nurse spoke to Bradley whom advised that she did not receive the fax, confirmed the fax number as [phone], re-faxed instructions, gave verbal instructions as well, MD Javid office will implement instructions to pt

## 2013-06-13 ENCOUNTER — Ambulatory Visit (HOSPITAL_COMMUNITY)
Admission: RE | Admit: 2013-06-13 | Discharge: 2013-06-13 | Disposition: A | Discharge status: Home / Self Care | Payer: Medicare Other | Source: Ambulatory Visit | Attending: Urology | Admitting: Urology

## 2013-06-13 ENCOUNTER — Encounter (HOSPITAL_COMMUNITY)
Admission: RE | Disposition: A | Discharge status: Home / Self Care | Payer: Self-pay | Source: Ambulatory Visit | Attending: Urology

## 2013-06-13 ENCOUNTER — Ambulatory Visit (HOSPITAL_COMMUNITY): Payer: Medicare Other

## 2013-06-13 ENCOUNTER — Encounter (HOSPITAL_COMMUNITY): Payer: Medicare Other | Admitting: Anesthesiology

## 2013-06-13 ENCOUNTER — Ambulatory Visit (HOSPITAL_COMMUNITY): Payer: Medicare Other | Admitting: Anesthesiology

## 2013-06-13 ENCOUNTER — Encounter (HOSPITAL_COMMUNITY): Payer: Self-pay | Admitting: *Deleted

## 2013-06-13 DIAGNOSIS — R072 Precordial pain: Secondary | ICD-10-CM

## 2013-06-13 DIAGNOSIS — R972 Elevated prostate specific antigen [PSA]: Secondary | ICD-10-CM | POA: Insufficient documentation

## 2013-06-13 DIAGNOSIS — I2510 Atherosclerotic heart disease of native coronary artery without angina pectoris: Secondary | ICD-10-CM

## 2013-06-13 DIAGNOSIS — E669 Obesity, unspecified: Secondary | ICD-10-CM

## 2013-06-13 DIAGNOSIS — I10 Essential (primary) hypertension: Secondary | ICD-10-CM | POA: Insufficient documentation

## 2013-06-13 DIAGNOSIS — E7800 Pure hypercholesterolemia, unspecified: Secondary | ICD-10-CM

## 2013-06-13 DIAGNOSIS — J4489 Other specified chronic obstructive pulmonary disease: Secondary | ICD-10-CM | POA: Insufficient documentation

## 2013-06-13 DIAGNOSIS — J449 Chronic obstructive pulmonary disease, unspecified: Secondary | ICD-10-CM | POA: Insufficient documentation

## 2013-06-13 DIAGNOSIS — G4730 Sleep apnea, unspecified: Secondary | ICD-10-CM

## 2013-06-13 DIAGNOSIS — Z720 Tobacco use: Secondary | ICD-10-CM

## 2013-06-13 HISTORY — PX: PROSTATE BIOPSY: SHX241

## 2013-06-13 SURGERY — BIOPSY, PROSTATE
Anesthesia: Monitor Anesthesia Care | Site: Rectum | Wound class: Clean Contaminated

## 2013-06-13 MED ORDER — FENTANYL CITRATE 0.05 MG/ML IJ SOLN
25.0000 ug | INTRAMUSCULAR | Status: AC
  Administered 2013-06-13: 25 ug via INTRAVENOUS
  Administered 2013-06-13: 08:00:00 via INTRAVENOUS

## 2013-06-13 MED ORDER — ONDANSETRON HCL 4 MG/2ML IJ SOLN: 4.0000 mg | Freq: Once | INTRAMUSCULAR | Status: DC | PRN

## 2013-06-13 MED ORDER — MIDAZOLAM HCL 2 MG/2ML IJ SOLN
INTRAMUSCULAR | Status: AC
  Filled 2013-06-13: qty 2

## 2013-06-13 MED ORDER — CIPROFLOXACIN IN D5W 200 MG/100ML IV SOLN
200.0000 mg | Freq: Two times a day (BID) | INTRAVENOUS | Status: DC
  Administered 2013-06-13: 200 mg via INTRAVENOUS

## 2013-06-13 MED ORDER — FENTANYL CITRATE 0.05 MG/ML IJ SOLN: INTRAMUSCULAR | Status: DC | PRN

## 2013-06-13 MED ORDER — LACTATED RINGERS IV SOLN
INTRAVENOUS | Status: DC
  Administered 2013-06-13: 08:00:00 via INTRAVENOUS

## 2013-06-13 MED ORDER — STERILE WATER FOR IRRIGATION IR SOLN
Status: DC | PRN
  Administered 2013-06-13: 1000 mL

## 2013-06-13 MED ORDER — CIPROFLOXACIN HCL 250 MG PO TABS: 250.0000 mg | ORAL_TABLET | Freq: Two times a day (BID) | ORAL | Status: DC

## 2013-06-13 MED ORDER — MIDAZOLAM HCL 2 MG/2ML IJ SOLN
1.0000 mg | INTRAMUSCULAR | Status: DC | PRN
  Administered 2013-06-13: 2 mg via INTRAVENOUS
  Administered 2013-06-13: 1 mg via INTRAVENOUS

## 2013-06-13 MED ORDER — PROPOFOL 10 MG/ML IV EMUL
INTRAVENOUS | Status: AC
  Filled 2013-06-13: qty 20

## 2013-06-13 MED ORDER — CIPROFLOXACIN IN D5W 200 MG/100ML IV SOLN
INTRAVENOUS | Status: AC
  Filled 2013-06-13: qty 100

## 2013-06-13 MED ORDER — FENTANYL CITRATE 0.05 MG/ML IJ SOLN: 25.0000 ug | INTRAMUSCULAR | Status: DC | PRN

## 2013-06-13 MED ORDER — LIDOCAINE HCL (CARDIAC) 10 MG/ML IV SOLN
INTRAVENOUS | Status: DC | PRN
  Administered 2013-06-13: 10 mg via INTRAVENOUS

## 2013-06-13 MED ORDER — PROPOFOL INFUSION 10 MG/ML OPTIME
INTRAVENOUS | Status: DC | PRN
  Administered 2013-06-13: 100 ug/kg/min via INTRAVENOUS

## 2013-06-13 MED ORDER — LIDOCAINE HCL (PF) 1 % IJ SOLN
INTRAMUSCULAR | Status: AC
  Filled 2013-06-13: qty 5

## 2013-06-13 SURGICAL SUPPLY — 24 items
CLOTH BEACON ORANGE TIMEOUT ST (SAFETY) ×2 IMPLANT
CONT PRE FILLED 20ML (MISCELLANEOUS) ×8 IMPLANT
COVER LIGHT HANDLE STERIS (MISCELLANEOUS) ×4 IMPLANT
COVER TABLE BACK 60X90 (DRAPES) ×2 IMPLANT
DRAPE PROXIMA HALF (DRAPES) ×2 IMPLANT
GLOVE BIO SURGEON STRL SZ7 (GLOVE) ×4 IMPLANT
GLOVE BIOGEL PI IND STRL 7.0 (GLOVE) IMPLANT
GLOVE BIOGEL PI INDICATOR 7.0 (GLOVE) ×1
GLOVE SS BIOGEL STRL SZ 6.5 (GLOVE) IMPLANT
GLOVE SUPERSENSE BIOGEL SZ 6.5 (GLOVE) ×1
GOWN STRL REIN XL XLG (GOWN DISPOSABLE) ×2 IMPLANT
KIT ROOM TURNOVER AP CYSTO (KITS) ×2 IMPLANT
LUBRICANT JELLY 4.5OZ STERILE (MISCELLANEOUS) ×2 IMPLANT
MANIFOLD NEPTUNE II (INSTRUMENTS) ×2 IMPLANT
MARKER SKIN DUAL TIP RULER LAB (MISCELLANEOUS) ×2 IMPLANT
NDL BIOPSY 18X20 MAGNUM (NEEDLE) IMPLANT
NEEDLE BIOPSY 18X20 MAGNUM (NEEDLE) ×2 IMPLANT
NS IRRIG 1000ML POUR BTL (IV SOLUTION) ×2 IMPLANT
PAD ARMBOARD 7.5X6 YLW CONV (MISCELLANEOUS) ×2 IMPLANT
PAD TELFA 3X4 1S STER (GAUZE/BANDAGES/DRESSINGS) ×2 IMPLANT
SPONGE GAUZE 4X4 12PLY (GAUZE/BANDAGES/DRESSINGS) ×2 IMPLANT
SYRINGE IRR TOOMEY STRL 70CC (SYRINGE) IMPLANT
TOWEL OR 17X26 4PK STRL BLUE (TOWEL DISPOSABLE) ×2 IMPLANT
WATER STERILE IRR 1000ML POUR (IV SOLUTION) ×2 IMPLANT

## 2013-06-13 NOTE — Op Note (Signed)
Op [phone]

## 2013-06-13 NOTE — Anesthesia Postprocedure Evaluation (Signed)
Anesthesia Post Note  Patient: Carlos Goodwin  Procedure(s) Performed: Procedure(s) (LRB): ULTRASOUND GUIDED PROSTATE BIOPSY (N/A)  Anesthesia type: MAC  Patient location: PACU  Post pain: Pain level controlled  Post assessment: Post-op Vital signs reviewed, Patient's Cardiovascular Status Stable, Respiratory Function Stable, Patent Airway, No signs of Nausea or vomiting and Pain level controlled  Last Vitals:  Filed Vitals:   06/13/13 0945  BP:   Pulse: 101  Temp:   Resp: 18    Post vital signs: Reviewed and stable  Level of consciousness: awake and alert   Complications: No apparent anesthesia complications

## 2013-06-13 NOTE — Transfer of Care (Signed)
Immediate Anesthesia Transfer of Care Note  Patient: Carlos Goodwin  Procedure(s) Performed: Procedure(s): ULTRASOUND GUIDED PROSTATE BIOPSY (N/A)  Patient Location: PACU  Anesthesia Type:MAC  Level of Consciousness: awake and patient cooperative  Airway & Oxygen Therapy: Patient Spontanous Breathing and non-rebreather face mask  Post-op Assessment: Report given to PACU RN, Post -op Vital signs reviewed and stable and Patient moving all extremities  Post vital signs: Reviewed and stable  Complications: No apparent anesthesia complications

## 2013-06-13 NOTE — Anesthesia Procedure Notes (Signed)
Procedure Name: MAC Date/Time: 06/13/2013 9:01 AM Performed by: Franco Nones Pre-anesthesia Checklist: Patient identified, Emergency Drugs available, Suction available, Timeout performed and Patient being monitored Patient Re-evaluated:Patient Re-evaluated prior to inductionOxygen Delivery Method: Non-rebreather mask

## 2013-06-13 NOTE — Progress Notes (Signed)
Awake. Talking. Oriented to place per nurse. Wants to go home. Wants coffee and a cigarette. Denies pain.

## 2013-06-13 NOTE — H&P (Signed)
NAME:  REDFORD, BEHRLE NO.:  [id number]  MEDICAL RECORD NO.:  [id number]  LOCATION:                                 FACILITY:  PHYSICIAN:  Ky Barban, M.D.DATE OF BIRTH:  [birth year]  DATE OF ADMISSION:  06/13/2013 DATE OF DISCHARGE:  LH                             HISTORY & PHYSICAL   CHIEF COMPLAINT:  Elevated PSA.  HISTORY OF PRESENT ILLNESS:  A 62 year old gentleman.  His PSA is 9.81. He is a patient of Dr. Juanetta Gosling.  He was referred to me in August of this year with elevated PSA at 8.41 and I repeated his PSA, it has gone up to 9.81.  His BPH is only 13%.  He is 62 years old having almost no symptoms of prostatism.  I have advised him to undergo prostate ultrasound, multiple needle biopsies.  He is coming as an outpatient, will undergo this procedure under anesthesia.  So I will do multiple random biopsies of the prostate under anesthesia.  I have explained him the procedure, its limitations, complications especially remote possibility of infection requiring IV antibiotic, admission in the hospital.  Most of the time, there is no problem.  His only other complaint is slight dysuria.  Urinary flow is good.  PAST MEDICAL HISTORY:  No history of diabetes or hypertension.  FAMILY HISTORY:  Grandfather had prostate cancer.  PERSONAL HISTORY:  Smokes 1 pack of cigarettes per day for the last 40 years.  Socially drinks.  He has coronary artery disease, has stent in 2004 and takes aspirin, which according to his cardiologist we have discontinued that for the biopsy purposes.  PHYSICAL EXAMINATION:  GENERAL:  Moderately built not in acute distress, fully conscious, alert, oriented. VITAL SIGNS:  His blood pressure 146/80, temperature 97.6.  Urinalysis is normal. CENTRAL NERVOUS SYSTEM:  No gross neurological deficit. HEAD, NECK, EYES, ENT:  Negative. CHEST:  Symmetrical nervous sounds. HEART:  Regular sinus rhythm.  No murmurs. ABDOMEN:  Soft, flat.   Liver, spleen, kidneys are not palpable.  No CVA tenderness. GU:  External genitalia is circumcised.  Meatus adequate.  Testicles are normal. RECTAL:  Prostate 0.5+ smooth and firm.  IMPRESSION:  Elevated PSA.  PLAN:  Transrectal needle biopsy of the prostate.  Multiple biopsies under anesthesia as an outpatient.  Procedure limitation, complications are discussed.  He also has coronary artery disease and coronary artery stent since 2004, but having no symptoms.     Ky Barban, M.D.     MIJ/MEDQ  D:  06/12/2013  T:  06/13/2013  Job:  [id number]

## 2013-06-13 NOTE — Progress Notes (Signed)
No change in H&P on reexamination. 

## 2013-06-13 NOTE — Anesthesia Preprocedure Evaluation (Addendum)
Anesthesia Evaluation  Patient identified by MRN, date of birth, ID band Patient awake    Reviewed: Allergy & Precautions, H&P , NPO status , Patient's Chart, lab work & pertinent test results  Airway Mallampati: II TM Distance: >3 FB     Dental  (+) Teeth Intact and Missing   Pulmonary sleep apnea , COPD breath sounds clear to auscultation+ rhonchi         Cardiovascular hypertension, Pt. on medications + CAD and + Past MI Rhythm:Regular Rate:Normal     Neuro/Psych    GI/Hepatic GERD-  Medicated and Controlled,  Endo/Other  Morbid obesity  Renal/GU      Musculoskeletal   Abdominal   Peds  Hematology   Anesthesia Other Findings   Reproductive/Obstetrics                          Anesthesia Physical Anesthesia Plan  ASA: III  Anesthesia Plan: MAC   Post-op Pain Management:    Induction: Intravenous  Airway Management Planned: Simple Face Mask  Additional Equipment:   Intra-op Plan:   Post-operative Plan:   Informed Consent: I have reviewed the patients History and Physical, chart, labs and discussed the procedure including the risks, benefits and alternatives for the proposed anesthesia with the patient or authorized representative who has indicated his/her understanding and acceptance.     Plan Discussed with:   Anesthesia Plan Comments:         Anesthesia Quick Evaluation

## 2013-06-13 NOTE — Progress Notes (Signed)
Awake. Denies pain. No drainage from meatus or rectum. abd soft, round and without distention.

## 2013-06-14 ENCOUNTER — Encounter (HOSPITAL_COMMUNITY): Payer: Self-pay | Admitting: Urology

## 2013-06-14 NOTE — Op Note (Signed)
NAME:  Carlos Goodwin, Carlos Goodwin NO.:  [id number]  MEDICAL RECORD NO.:  [id number]  LOCATION:  [facility]                          FACILITY:  [facility]  PHYSICIAN:  Ky Barban, M.D.DATE OF BIRTH:  November 16, 1950  DATE OF PROCEDURE: DATE OF DISCHARGE:  06/13/2013                              OPERATIVE REPORT   PREOPERATIVE DIAGNOSIS:  Elevated PSA.  POSTOPERATIVE DIAGNOSIS:  Elevated PSA.  PROCEDURE:  Transrectal ultrasound with multiple needle biopsies.  PROCEDURE:  The patient under IV sedation in left decubitus position. After doing a digital rectal exam, ultrasound probe was introduced.  The prostate was scanned and volume was taken.  Both sides of the prostate looked hypoechoic and after taking the volume with the help of the needle guide, I took several biopsies from right apex and left apex, right base and left base, and right mid prostate, and left mid prostate. After taking the biopsy, he was having some bleeding, but at the end he quit bleeding.  Probe was removed.  The patient left the operating room in a satisfactory condition.     Ky Barban, M.D.     MIJ/MEDQ  D:  06/13/2013  T:  06/14/2013  Job:  [id number]

## 2013-07-21 ENCOUNTER — Other Ambulatory Visit (HOSPITAL_COMMUNITY): Payer: Self-pay | Admitting: Pulmonary Disease

## 2013-07-21 ENCOUNTER — Ambulatory Visit (HOSPITAL_COMMUNITY)
Admission: RE | Admit: 2013-07-21 | Discharge: 2013-07-21 | Disposition: A | Discharge status: Home / Self Care | Payer: Medicare Other | Source: Ambulatory Visit | Attending: Pulmonary Disease | Admitting: Pulmonary Disease

## 2013-07-21 DIAGNOSIS — M7989 Other specified soft tissue disorders: Secondary | ICD-10-CM

## 2013-07-21 DIAGNOSIS — M79604 Pain in right leg: Secondary | ICD-10-CM

## 2013-07-21 DIAGNOSIS — M79609 Pain in unspecified limb: Secondary | ICD-10-CM | POA: Insufficient documentation

## 2013-07-21 DIAGNOSIS — L539 Erythematous condition, unspecified: Secondary | ICD-10-CM | POA: Insufficient documentation

## 2013-07-21 DIAGNOSIS — R609 Edema, unspecified: Secondary | ICD-10-CM | POA: Insufficient documentation

## 2013-11-27 ENCOUNTER — Other Ambulatory Visit (HOSPITAL_COMMUNITY): Payer: Self-pay | Admitting: Pulmonary Disease

## 2013-11-27 ENCOUNTER — Ambulatory Visit (HOSPITAL_COMMUNITY)
Admission: RE | Admit: 2013-11-27 | Discharge: 2013-11-27 | Disposition: A | Discharge status: Home / Self Care | Payer: Medicare Other | Source: Ambulatory Visit | Attending: Pulmonary Disease | Admitting: Pulmonary Disease

## 2013-11-27 DIAGNOSIS — R221 Localized swelling, mass and lump, neck: Secondary | ICD-10-CM

## 2013-11-27 DIAGNOSIS — R220 Localized swelling, mass and lump, head: Secondary | ICD-10-CM | POA: Insufficient documentation

## 2013-11-28 ENCOUNTER — Ambulatory Visit (HOSPITAL_COMMUNITY)
Admission: RE | Admit: 2013-11-28 | Discharge: 2013-11-28 | Disposition: A | Discharge status: Home / Self Care | Payer: Medicare Other | Source: Ambulatory Visit | Attending: Pulmonary Disease | Admitting: Pulmonary Disease

## 2013-11-28 ENCOUNTER — Ambulatory Visit (INDEPENDENT_AMBULATORY_CARE_PROVIDER_SITE_OTHER): Payer: Medicare Other | Admitting: Urology

## 2013-11-28 DIAGNOSIS — R972 Elevated prostate specific antigen [PSA]: Secondary | ICD-10-CM

## 2013-11-28 DIAGNOSIS — R1032 Left lower quadrant pain: Secondary | ICD-10-CM

## 2013-11-28 DIAGNOSIS — R221 Localized swelling, mass and lump, neck: Secondary | ICD-10-CM

## 2013-11-28 DIAGNOSIS — R220 Localized swelling, mass and lump, head: Secondary | ICD-10-CM | POA: Insufficient documentation

## 2013-11-28 LAB — POCT I-STAT CREATININE: Creatinine, Ser: 1.1 mg/dL (ref 0.50–1.35)

## 2013-11-28 MED ORDER — IOHEXOL 300 MG/ML  SOLN
75.0000 mL | Freq: Once | INTRAMUSCULAR | Status: AC | PRN
  Administered 2013-11-28: 75 mL via INTRAVENOUS

## 2013-11-29 ENCOUNTER — Other Ambulatory Visit (HOSPITAL_COMMUNITY): Payer: Self-pay | Admitting: Pulmonary Disease

## 2013-11-29 DIAGNOSIS — R918 Other nonspecific abnormal finding of lung field: Secondary | ICD-10-CM

## 2013-11-30 ENCOUNTER — Ambulatory Visit (HOSPITAL_COMMUNITY): Payer: Medicare Other

## 2013-12-01 ENCOUNTER — Telehealth: Payer: Self-pay | Admitting: *Deleted

## 2013-12-01 ENCOUNTER — Other Ambulatory Visit: Payer: Self-pay | Admitting: *Deleted

## 2013-12-01 DIAGNOSIS — Z720 Tobacco use: Secondary | ICD-10-CM

## 2013-12-01 NOTE — Telephone Encounter (Signed)
Called pt regarding appt for PFT's on 12/04/13 at 10:00 and [place] appt 12/07/13 at 3:45.

## 2013-12-04 ENCOUNTER — Other Ambulatory Visit: Payer: Self-pay | Admitting: *Deleted

## 2013-12-04 ENCOUNTER — Telehealth: Payer: Self-pay | Admitting: *Deleted

## 2013-12-04 ENCOUNTER — Encounter (INDEPENDENT_AMBULATORY_CARE_PROVIDER_SITE_OTHER): Payer: Self-pay

## 2013-12-04 ENCOUNTER — Ambulatory Visit (HOSPITAL_COMMUNITY): Payer: Medicare Other

## 2013-12-04 ENCOUNTER — Ambulatory Visit (HOSPITAL_COMMUNITY)
Admission: RE | Admit: 2013-12-04 | Discharge: 2013-12-04 | Disposition: A | Discharge status: Home / Self Care | Payer: Medicare Other | Source: Ambulatory Visit | Attending: Thoracic Surgery (Cardiothoracic Vascular Surgery) | Admitting: Thoracic Surgery (Cardiothoracic Vascular Surgery)

## 2013-12-04 ENCOUNTER — Encounter: Payer: Self-pay | Admitting: *Deleted

## 2013-12-04 DIAGNOSIS — R072 Precordial pain: Secondary | ICD-10-CM

## 2013-12-04 DIAGNOSIS — Z720 Tobacco use: Secondary | ICD-10-CM

## 2013-12-04 DIAGNOSIS — F17200 Nicotine dependence, unspecified, uncomplicated: Secondary | ICD-10-CM | POA: Insufficient documentation

## 2013-12-04 LAB — PULMONARY FUNCTION TEST
DL/VA % PRED: 111 %
DL/VA: 5.12 ml/min/mmHg/L
DLCO UNC: 23.6 ml/min/mmHg
DLCO unc % pred: 72 %
FEF 25-75 POST: 2.19 L/s
FEF 25-75 PRE: 1.2 L/s
FEF2575-%Change-Post: 81 %
FEF2575-%PRED-POST: 79 %
FEF2575-%PRED-PRE: 43 %
FEV1-%Change-Post: 18 %
FEV1-%Pred-Post: 60 %
FEV1-%Pred-Pre: 50 %
FEV1-Post: 2.08 L
FEV1-Pre: 1.76 L
FEV1FVC-%CHANGE-POST: 13 %
FEV1FVC-%Pred-Pre: 95 %
FEV6-%CHANGE-POST: 4 %
FEV6-%PRED-POST: 58 %
FEV6-%PRED-PRE: 55 %
FEV6-POST: 2.56 L
FEV6-PRE: 2.45 L
FEV6FVC-%Pred-Post: 105 %
FEV6FVC-%Pred-Pre: 105 %
FVC-%Change-Post: 4 %
FVC-%PRED-POST: 55 %
FVC-%Pred-Pre: 53 %
FVC-POST: 2.56 L
FVC-PRE: 2.45 L
POST FEV6/FVC RATIO: 100 %
PRE FEV1/FVC RATIO: 72 %
Post FEV1/FVC ratio: 81 %
Pre FEV6/FVC Ratio: 100 %
RV % pred: 141 %
RV: 3.29 L
TLC % pred: 90 %
TLC: 6.32 L

## 2013-12-04 MED ORDER — ALBUTEROL SULFATE (2.5 MG/3ML) 0.083% IN NEBU
2.5000 mg | INHALATION_SOLUTION | Freq: Once | RESPIRATORY_TRACT | Status: AC
  Administered 2013-12-04: 2.5 mg via RESPIRATORY_TRACT

## 2013-12-04 NOTE — Telephone Encounter (Signed)
Called pt to give appt for CT Chest and MTOC appt.  He and wife verbalized understanding of appt time and place.

## 2013-12-06 ENCOUNTER — Ambulatory Visit (HOSPITAL_COMMUNITY)
Admission: RE | Admit: 2013-12-06 | Discharge: 2013-12-06 | Disposition: A | Discharge status: Home / Self Care | Payer: Medicare Other | Source: Ambulatory Visit | Attending: Thoracic Surgery (Cardiothoracic Vascular Surgery) | Admitting: Thoracic Surgery (Cardiothoracic Vascular Surgery)

## 2013-12-06 DIAGNOSIS — R911 Solitary pulmonary nodule: Secondary | ICD-10-CM | POA: Insufficient documentation

## 2013-12-07 ENCOUNTER — Encounter: Payer: Self-pay | Admitting: *Deleted

## 2013-12-07 ENCOUNTER — Encounter: Payer: Self-pay | Admitting: Thoracic Surgery (Cardiothoracic Vascular Surgery)

## 2013-12-07 ENCOUNTER — Institutional Professional Consult (permissible substitution) (INDEPENDENT_AMBULATORY_CARE_PROVIDER_SITE_OTHER): Payer: Medicare Other | Admitting: Thoracic Surgery (Cardiothoracic Vascular Surgery)

## 2013-12-07 VITALS — BP 139/70 | HR 63 | Temp 97.0°F | Resp 18 | Wt 137.9 lb

## 2013-12-07 DIAGNOSIS — C801 Malignant (primary) neoplasm, unspecified: Secondary | ICD-10-CM

## 2013-12-07 DIAGNOSIS — Z720 Tobacco use: Secondary | ICD-10-CM

## 2013-12-07 DIAGNOSIS — C3490 Malignant neoplasm of unspecified part of unspecified bronchus or lung: Secondary | ICD-10-CM

## 2013-12-07 DIAGNOSIS — G4730 Sleep apnea, unspecified: Secondary | ICD-10-CM

## 2013-12-07 DIAGNOSIS — J449 Chronic obstructive pulmonary disease, unspecified: Secondary | ICD-10-CM

## 2013-12-07 DIAGNOSIS — F17200 Nicotine dependence, unspecified, uncomplicated: Secondary | ICD-10-CM

## 2013-12-07 NOTE — Progress Notes (Signed)
Smoking cessation counseling given.  Pt stated he is not ready to quit.  I discussed options but unfortunately at this time he is not ready to quit.

## 2013-12-07 NOTE — Progress Notes (Signed)
PCP is Alonza Bogus, MD Referring Provider is Alonza Bogus, MD  No chief complaint on file.   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 He went to Dr. Luan Pulling and was diagnosed with cellulitis. An MR of the neck was done and showed a right apical lung mass. A CT of the chest was done which showed a 3.8 x 3.6 .4.8 cm mass in the right upper lobe abutting the mediastinum. There was a second smaller mass adjacent to it as well as hilar and mediastinal adenopathy.   He had been feeling well prior to noticing the neck swelling. He has an occasional cough, but it has not changed recently. He denies hemoptysis. He denies weight loss, new bone or joint pain, headaches and visual changes.  ECOG/ZUBROD= 0   Past Medical History  Diagnosis Date  . COPD (chronic obstructive pulmonary disease)   . Coronary artery disease   . Sleep apnea   . Hypertension   . High cholesterol   . MI (myocardial infarction) 2004  . GERD (gastroesophageal reflux disease)     Past Surgical History  Procedure Laterality Date  . Appendectomy    . Coronary angioplasty  2004    1 stent  . Anterior cruciate ligament repair Right   . Tonsillectomy    . Prostate biopsy N/A 06/13/2013    Procedure: ULTRASOUND GUIDED PROSTATE BIOPSY;  Surgeon: Marissa Nestle, MD;  Location: [facility];  Service: Urology;  Laterality: N/A;    Family History  Problem Relation Age of Onset  . Diabetes Mother   . Cancer Father     Social History History  Substance Use Topics  . Smoking status: Current Every Day Smoker -- 1.00 packs/day for 50 years    Types: Cigarettes  . Smokeless tobacco: Not on file  . Alcohol Use: No     Comment: social alcohol    Current Outpatient Prescriptions  Medication Sig Dispense Refill  . aspirin EC 81 MG EC tablet Take 1 tablet (81 mg total) by mouth daily.  30 tablet  0  .  ciprofloxacin (CIPRO) 250 MG tablet Take 1 tablet (250 mg total) by mouth 2 (two) times daily.  6 tablet  0  . metoprolol succinate (TOPROL-XL) 100 MG 24 hr tablet Take 100 mg by mouth daily. Take with or immediately following a meal.      . simvastatin (ZOCOR) 40 MG tablet Take 40 mg by mouth every evening.      . tiotropium (SPIRIVA) 18 MCG inhalation capsule Place 18 mcg into inhaler and inhale daily.       No current facility-administered medications for this visit.    Allergies  Allergen Reactions  . Other Rash    States that in 2004 he was placed on a blood thinner that made him break out in a rash.?plavix     Review of Systems  Constitutional: Negative for activity change, fatigue and unexpected weight change.  HENT:       Swelling in neck  Respiratory: Positive for apnea, cough and wheezing.   Cardiovascular: Negative for chest pain.  Gastrointestinal: Positive for constipation.  Neurological: Negative.   Hematological: Negative for adenopathy.  All other systems reviewed and are negative.   BP 139/70  Pulse 63  Temp(Src) 97 F (36.1 C)  Resp 18  Wt 137 lb 14.4 oz (62.551 kg)  SpO2 98% Physical  Exam  Vitals reviewed. Constitutional: He is oriented to person, place, and time. He appears well-developed and well-nourished. No distress.  obese  HENT:  Head: Normocephalic and atraumatic.  Eyes: EOM are normal. Pupils are equal, round, and reactive to light.  Neck: Neck supple. No tracheal deviation present.  Cardiovascular: Normal rate, regular rhythm, normal heart sounds and intact distal pulses.  Exam reveals no friction rub.   No murmur heard. Pulmonary/Chest: Effort normal and breath sounds normal. He has no wheezes. He has no rales.  Abdominal: Soft. There is no tenderness.  Musculoskeletal: He exhibits no edema.  Lymphadenopathy:    He has no cervical adenopathy.  Neurological: He is alert and oriented to person, place, and time. No cranial nerve deficit.   Skin: Skin is warm and dry.  Psychiatric: He has a normal mood and affect.     Diagnostic Tests: CT CHEST WITHOUT CONTRAST  TECHNIQUE:  Multidetector CT imaging of the chest was performed following the  standard protocol without IV contrast.  COMPARISON: Partial comparison to CT neck dated 11/18/2013  FINDINGS:  3.9 x 3.8 x 4.6 cm mass in the medial right lung apex (series  5/image 10), suspicious for primary bronchogenic neoplasm.  No definite invasion of the chest wall or adjacent right 1st/2nd  ribs.  Additional 2.6 x 1.6 cm nodule in the central right upper lobe  (series 5/ image 18).  Calcified pleural plaques along the anterior right upper lobe  (series 5/ image 20) and left lung base (series 5/ image 52),  suggesting asbestos related pleural disease. Mild  fibrosis/parenchymal banding with volume loss in the left lower lobe  (series 5/image 51).  No pleural effusion or pneumothorax.  Visualized thyroid is unremarkable.  The heart is normal in size. No pericardial effusion. Coronary  atherosclerosis with LAD stent. Atherosclerotic calcifications of  the aortic arch.  Thoracic lymphadenopathy, including:  --8 mm short axis right supraclavicular node (series 2/image 3),  indeterminate  --Right paratracheal nodes measuring up to 1.6 cm short axis (series  2/ image 20)  --2.5 cm short axis right hilar node (series 2/ image 29)  --1.1 cm short axis subcarinal node (series 2/image 30)  Visualized upper abdomen is unremarkable. Bilateral adrenal glands  were not imaged.  Degenerative changes of the visualized thoracolumbar spine.  IMPRESSION:  4.6 cm mass in the medial right lung apex, suspicious for primary  bronchogenic neoplasm.  Additional 2.6 cm nodule in the central right upper lobe.  Mediastinal/right hilar lymphadenopathy, suspicious for nodal  metastases, as above.  Asbestos related pleural disease.  Electronically Signed  By: Julian Hy M.D.  On:  12/06/2013 14:48  PFT FVC  2.45(53%) FEV1  1.76(50%) FEV1 post 2.08(60%) DLCO  72%  Impression: 63 yo man with a history of tobacco abuse who presents with a new right apical mass. Ct of the chest shows hilar and mediastinal adenopathy. 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 He needs a tissue diagnosis so that appropriate treatment can be initiated.  I had a long discussion with Mr. Braver and his family and reviewed the films with them.   I recommended that we proceed with a navigational bronchoscopy and EBUS for diagnostic and staging purposes. I described the procedure to them including the use of general anesthesia. We discussed the indications, risks, benefits and alternatives. They understand the risks include those associated with anesthesia, bleeding, pneumothorax, injury to adjacent intrathoracic structures, and failure to make a diagnosis.  He will also need  a PET/CT and MR of the brain to complete staging  Plan: PET/CT  MR Brain  ENB/ EBUS on Tuesday 12/12/2013

## 2013-12-08 ENCOUNTER — Other Ambulatory Visit: Payer: Self-pay

## 2013-12-08 ENCOUNTER — Encounter (HOSPITAL_COMMUNITY): Payer: Self-pay

## 2013-12-08 ENCOUNTER — Encounter (HOSPITAL_COMMUNITY)
Admission: RE | Admit: 2013-12-08 | Discharge: 2013-12-08 | Disposition: A | Discharge status: Home / Self Care | Payer: Medicare Other | Source: Ambulatory Visit | Attending: Thoracic Surgery (Cardiothoracic Vascular Surgery) | Admitting: Thoracic Surgery (Cardiothoracic Vascular Surgery)

## 2013-12-08 ENCOUNTER — Encounter (HOSPITAL_COMMUNITY)
Admission: RE | Admit: 2013-12-08 | Discharge: 2013-12-08 | Disposition: A | Discharge status: Home / Self Care | Payer: Medicare Other | Source: Ambulatory Visit | Attending: Pulmonary Disease | Admitting: Pulmonary Disease

## 2013-12-08 ENCOUNTER — Encounter (HOSPITAL_COMMUNITY)
Admission: RE | Admit: 2013-12-08 | Discharge: 2013-12-08 | Disposition: A | Discharge status: Home / Self Care | Payer: Medicare Other | Source: Ambulatory Visit | Attending: Anesthesiology | Admitting: Anesthesiology

## 2013-12-08 DIAGNOSIS — D381 Neoplasm of uncertain behavior of trachea, bronchus and lung: Secondary | ICD-10-CM

## 2013-12-08 DIAGNOSIS — Z01812 Encounter for preprocedural laboratory examination: Secondary | ICD-10-CM | POA: Insufficient documentation

## 2013-12-08 DIAGNOSIS — R222 Localized swelling, mass and lump, trunk: Secondary | ICD-10-CM | POA: Insufficient documentation

## 2013-12-08 DIAGNOSIS — R51 Headache: Secondary | ICD-10-CM

## 2013-12-08 DIAGNOSIS — C3490 Malignant neoplasm of unspecified part of unspecified bronchus or lung: Secondary | ICD-10-CM | POA: Insufficient documentation

## 2013-12-08 DIAGNOSIS — C771 Secondary and unspecified malignant neoplasm of intrathoracic lymph nodes: Secondary | ICD-10-CM | POA: Insufficient documentation

## 2013-12-08 DIAGNOSIS — Z01818 Encounter for other preprocedural examination: Secondary | ICD-10-CM | POA: Insufficient documentation

## 2013-12-08 DIAGNOSIS — R918 Other nonspecific abnormal finding of lung field: Secondary | ICD-10-CM

## 2013-12-08 HISTORY — DX: Shortness of breath: R06.02

## 2013-12-08 HISTORY — DX: Family history of other specified conditions: Z84.89

## 2013-12-08 HISTORY — DX: Malignant (primary) neoplasm, unspecified: C80.1

## 2013-12-08 LAB — BASIC METABOLIC PANEL
BUN: 15 mg/dL (ref 6–23)
CHLORIDE: 101 meq/L (ref 96–112)
CO2: 30 meq/L (ref 19–32)
Calcium: 9.4 mg/dL (ref 8.4–10.5)
Creatinine, Ser: 0.84 mg/dL (ref 0.50–1.35)
GFR calc Af Amer: >90 mL/min (ref >90)
GFR calc non Af Amer: >90 mL/min (ref >90)
Glucose, Bld: 97 mg/dL (ref 70–99)
Potassium: 5.8 mEq/L — ABNORMAL HIGH (ref 3.7–5.3)
SODIUM: 140 meq/L (ref 137–147)

## 2013-12-08 LAB — CBC
HCT: 52.7 % — ABNORMAL HIGH (ref 39.0–52.0)
Hemoglobin: 17.3 g/dL — ABNORMAL HIGH (ref 13.0–17.0)
MCH: 29 pg (ref 26.0–34.0)
MCHC: 32.8 g/dL (ref 30.0–36.0)
MCV: 88.3 fL (ref 78.0–100.0)
PLATELETS: 256 10*3/uL (ref 150–400)
RBC: 5.97 MIL/uL — AB (ref 4.22–5.81)
RDW: 14.2 % (ref 11.5–15.5)
WBC: 9.7 10*3/uL (ref 4.0–10.5)

## 2013-12-08 LAB — GLUCOSE, CAPILLARY: Glucose-Capillary: 85 mg/dL (ref 70–99)

## 2013-12-08 MED ORDER — FLUDEOXYGLUCOSE F - 18 (FDG) INJECTION
14.3000 | Freq: Once | INTRAVENOUS | Status: AC | PRN
  Administered 2013-12-08: 14.3 via INTRAVENOUS

## 2013-12-08 NOTE — Progress Notes (Signed)
Spoke with Manuela Schwartz at Dr. Leonarda Salon office to clarify Aspirin administration. According to Manuela Schwartz, it is okay for pt to stop Aspirin.

## 2013-12-08 NOTE — Progress Notes (Signed)
Pt stated that he takes all of his medications at night. Pt made aware to stop taking Aspirin and herbal medications. Do not take any NSAIDs ie: Ibuprofen, Advil, Naproxen or any medication containing Aspirin. Spoke with Ebony Hail, PA  (anesthesia) to review pt cardiac history.

## 2013-12-08 NOTE — Pre-Procedure Instructions (Signed)
Carlos Goodwin  12/08/2013   Your procedure is scheduled on:  Tuesday, Dec 12, 2013 at 8:35 AM  Report to [facility] (use Main Entrance "A'') at 6:30 AM.  Call this number if you have problems the morning of surgery: [phone]   Remember:   Do not eat food or drink liquids after midnight.   Take these medicines the morning of surgery with A SIP OF WATER: None   Do not wear jewelry, make-up or nail polish.  Do not wear lotions, powders, or perfumes. You may wear deodorant.  Do not shave 48 hours prior to surgery. Men may shave face and neck.  Do not bring valuables to the hospital.  [facility] is not responsible for any belongings or valuables.               Contacts, dentures or bridgework may not be worn into surgery.  Leave suitcase in the car. After surgery it may be brought to your room.  For patients admitted to the hospital, discharge time is determined by your treatment team.               Patients discharged the day of surgery will not be allowed to drive home.  Name and phone number of your driver:   Special Instructions:  Special Instructions:Special Instructions: [facility] - Preparing for Surgery  Before surgery, you can play an important role.  Because skin is not sterile, your skin needs to be as free of germs as possible.  You can reduce the number of germs on you skin by washing with CHG (chlorahexidine gluconate) soap before surgery.  CHG is an antiseptic cleaner which kills germs and bonds with the skin to continue killing germs even after washing.  Please DO NOT use if you have an allergy to CHG or antibacterial soaps.  If your skin becomes reddened/irritated stop using the CHG and inform your nurse when you arrive at Short Stay.  Do not shave (including legs and underarms) for at least 48 hours prior to the first CHG shower.  You may shave your face.  Please follow these instructions carefully:   1.  Shower with CHG Soap the night before surgery and  the morning of Surgery.  2.  If you choose to wash your hair, wash your hair first as usual with your normal shampoo.  3.  After you shampoo, rinse your hair and body thoroughly to remove the Shampoo.  4.  Use CHG as you would any other liquid soap.  You can apply chg directly  to the skin and wash gently with scrungie or a clean washcloth.  5.  Apply the CHG Soap to your body ONLY FROM THE NECK DOWN.  Do not use on open wounds or open sores.  Avoid contact with your eyes, ears, mouth and genitals (private parts).  Wash genitals (private parts) with your normal soap.  6.  Wash thoroughly, paying special attention to the area where your surgery will be performed.  7.  Thoroughly rinse your body with warm water from the neck down.  8.  DO NOT shower/wash with your normal soap after using and rinsing off the CHG Soap.  9.  Pat yourself dry with a clean towel.            10.  Wear clean pajamas.            11.  Place clean sheets on your bed the night of your first shower and  do not sleep with pets.  Day of Surgery  Do not apply any lotions the morning of surgery.  Please wear clean clothes to the hospital/surgery center.   Please read over the following fact sheets that you were given: Pain Booklet, Coughing and Deep Breathing and Surgical Site Infection Prevention

## 2013-12-08 NOTE — Progress Notes (Signed)
Pt chart forwarded to [place], [place] ( anesthesia) to review cardiac history and abnormal labs.

## 2013-12-11 ENCOUNTER — Encounter (HOSPITAL_COMMUNITY): Payer: Self-pay | Admitting: Pharmacy Technician

## 2013-12-11 ENCOUNTER — Encounter: Payer: Self-pay | Admitting: *Deleted

## 2013-12-11 DIAGNOSIS — R918 Other nonspecific abnormal finding of lung field: Secondary | ICD-10-CM | POA: Insufficient documentation

## 2013-12-11 NOTE — Progress Notes (Addendum)
Chart review: Patient is a 63 year old male scheduled for video bronchoscopy with endobronchial navigation on 12/12/13 by Dr. Roxan Hockey. He was recently evaluated for neck swelling and treated for cellulitis, but MR of the neck showed a right apical lung mass.  CT of the chest showed a 3.8 x 3.6 .4.8 cm mass in the right upper lobe abutting the mediastinum. There was a second smaller mass adjacent to it as well as hilar and mediastinal adenopathy. Findings were felt concerning for stage III Reserve lung carcinoma, but definitive tissue diagnosis is needed so appropriate treatment can be initiated. PET on 12/08/13 showed hypermetabolic right apical mass, right suprahilar lymph nodes and ipsilateral and subcarinal metastatic lymph nodes.     Other history includes smoking, COPD, CAD/MI '04 s/p LAD stent, obstructive sleep apnea, hypertension, hypercholesterolemia, GERD, lung cancer, appendectomy, tonsillectomy. His daughter has postoperative nausea and vomiting. PCP is Dr. Sinda Du. Cardiologist is Dr. Angelena Form, with last visit with Jory Sims, NP in 03/2013.    EKG on 03/20/13 showed: SR, rightward axis.  Cardiac cath on 03/08/13 showed:  Left main: No obstructive disease.  Left Anterior Descending Artery: Large caliber vessel that courses to the apex. The mid LAD stent is patent without restenosis. Small caliber diagonal branch is patent with mild proximal plaque.  Circumflex Artery: Large, dominant vessel with large bifurcating obtuse marginal branch. There is mild plaque in the distal vessel. No obstructive disease.  Right Coronary Artery: Small, non-dominant vessel with no obstructive disease.  Left Ventricular Angiogram: LVEF=55-60%.  Impression:  1. Stable single vessel CAD with patent mid LAD stent  2. Normal LV systolic function  Recommendations: Continued medical management. His pain could be secondary to GERD. Will stop H2 blocker and start PPI.   Echo on 03/08/14 showed: Left  ventricle: The cavity size was normal. Systolic function was normal. The estimated ejection fraction was in the range of 55% to 60%. Wall motion was normal; there were no regional wall motion abnormalities.  Carotid duplex on 03/08/13 showed less than 39% bilateral internal carotid artery stenosis, antegrade vertebral artery flow.  PFT 12/04/13 FVC 2.45(53%)  FEV1 1.76(50%)  FEV1 post 2.08(60%)  DLCO 72%  CXR on 12/08/13 noted.    Preoperative labs noted.  K 5.8 (no mention of hemolysis), Cr 0.84. H/H 17.3/52.7. No KCL, ACEI/ARB is listed in his medication list.  Will order an ISTAT on arrival to ensure stability/improvement of hyperkalemia. (Orders were pending at his PAT visit, so additional surgeon orders like PT/PTT, CMET/HFP will have to be done on the day of surgery.)  Patient has had a cath, echo, and cardiology office visit within the past year. No chest pain was documented from his PAT visit.  If no acute changes and same day lab results are acceptable then I would anticipate that he could proceed as planned.  George Hugh [facility] Short Stay Center/Anesthesiology Phone [phone] 12/11/2013 11:09 AM

## 2013-12-12 ENCOUNTER — Ambulatory Visit (HOSPITAL_COMMUNITY): Payer: Medicare Other | Admitting: Critical Care Medicine

## 2013-12-12 ENCOUNTER — Encounter (HOSPITAL_COMMUNITY)
Admission: RE | Disposition: A | Discharge status: Home / Self Care | Payer: Self-pay | Source: Ambulatory Visit | Attending: Thoracic Surgery (Cardiothoracic Vascular Surgery)

## 2013-12-12 ENCOUNTER — Ambulatory Visit (HOSPITAL_COMMUNITY): Payer: Medicare Other

## 2013-12-12 ENCOUNTER — Encounter: Payer: Self-pay | Admitting: *Deleted

## 2013-12-12 ENCOUNTER — Ambulatory Visit (HOSPITAL_COMMUNITY)
Admission: RE | Admit: 2013-12-12 | Discharge: 2013-12-12 | Disposition: A | Discharge status: Home / Self Care | Payer: Medicare Other | Source: Ambulatory Visit | Attending: Thoracic Surgery (Cardiothoracic Vascular Surgery) | Admitting: Thoracic Surgery (Cardiothoracic Vascular Surgery)

## 2013-12-12 ENCOUNTER — Encounter (HOSPITAL_COMMUNITY): Payer: Medicare Other | Admitting: Vascular Surgery

## 2013-12-12 ENCOUNTER — Encounter (HOSPITAL_COMMUNITY): Payer: Self-pay | Admitting: *Deleted

## 2013-12-12 ENCOUNTER — Telehealth: Payer: Self-pay | Admitting: *Deleted

## 2013-12-12 DIAGNOSIS — J449 Chronic obstructive pulmonary disease, unspecified: Secondary | ICD-10-CM | POA: Insufficient documentation

## 2013-12-12 DIAGNOSIS — I10 Essential (primary) hypertension: Secondary | ICD-10-CM | POA: Insufficient documentation

## 2013-12-12 DIAGNOSIS — Z7982 Long term (current) use of aspirin: Secondary | ICD-10-CM | POA: Diagnosis not present

## 2013-12-12 DIAGNOSIS — Z79899 Other long term (current) drug therapy: Secondary | ICD-10-CM | POA: Insufficient documentation

## 2013-12-12 DIAGNOSIS — K219 Gastro-esophageal reflux disease without esophagitis: Secondary | ICD-10-CM | POA: Diagnosis not present

## 2013-12-12 DIAGNOSIS — Z9861 Coronary angioplasty status: Secondary | ICD-10-CM | POA: Diagnosis not present

## 2013-12-12 DIAGNOSIS — J4489 Other specified chronic obstructive pulmonary disease: Secondary | ICD-10-CM | POA: Insufficient documentation

## 2013-12-12 DIAGNOSIS — I2510 Atherosclerotic heart disease of native coronary artery without angina pectoris: Secondary | ICD-10-CM | POA: Diagnosis not present

## 2013-12-12 DIAGNOSIS — F17200 Nicotine dependence, unspecified, uncomplicated: Secondary | ICD-10-CM | POA: Insufficient documentation

## 2013-12-12 DIAGNOSIS — G4730 Sleep apnea, unspecified: Secondary | ICD-10-CM | POA: Insufficient documentation

## 2013-12-12 DIAGNOSIS — C3490 Malignant neoplasm of unspecified part of unspecified bronchus or lung: Secondary | ICD-10-CM | POA: Insufficient documentation

## 2013-12-12 DIAGNOSIS — I252 Old myocardial infarction: Secondary | ICD-10-CM | POA: Diagnosis not present

## 2013-12-12 DIAGNOSIS — Z888 Allergy status to other drugs, medicaments and biological substances status: Secondary | ICD-10-CM | POA: Insufficient documentation

## 2013-12-12 DIAGNOSIS — R222 Localized swelling, mass and lump, trunk: Secondary | ICD-10-CM | POA: Diagnosis present

## 2013-12-12 DIAGNOSIS — D381 Neoplasm of uncertain behavior of trachea, bronchus and lung: Secondary | ICD-10-CM

## 2013-12-12 HISTORY — PX: VIDEO BRONCHOSCOPY WITH ENDOBRONCHIAL ULTRASOUND: SHX6177

## 2013-12-12 HISTORY — PX: VIDEO BRONCHOSCOPY WITH ENDOBRONCHIAL NAVIGATION: SHX6175

## 2013-12-12 LAB — POCT I-STAT 4, (NA,K, GLUC, HGB,HCT)
GLUCOSE: 105 mg/dL — AB (ref 70–99)
HEMATOCRIT: 53 % — AB (ref 39.0–52.0)
HEMOGLOBIN: 18 g/dL — AB (ref 13.0–17.0)
POTASSIUM: 4.6 meq/L (ref 3.7–5.3)
SODIUM: 141 meq/L (ref 137–147)

## 2013-12-12 LAB — PROTIME-INR
INR: 0.99 (ref 0.00–1.49)
Prothrombin Time: 12.9 seconds (ref 11.6–15.2)

## 2013-12-12 LAB — APTT: aPTT: 31 seconds (ref 24–37)

## 2013-12-12 SURGERY — VIDEO BRONCHOSCOPY WITH ENDOBRONCHIAL NAVIGATION
Anesthesia: General | Site: Chest

## 2013-12-12 MED ORDER — FENTANYL CITRATE 0.05 MG/ML IJ SOLN
INTRAMUSCULAR | Status: AC
  Filled 2013-12-12: qty 5

## 2013-12-12 MED ORDER — EPHEDRINE SULFATE 50 MG/ML IJ SOLN
INTRAMUSCULAR | Status: DC | PRN
  Administered 2013-12-12: 15 mg via INTRAVENOUS
  Administered 2013-12-12 (×2): 5 mg via INTRAVENOUS

## 2013-12-12 MED ORDER — ARTIFICIAL TEARS OP OINT
TOPICAL_OINTMENT | OPHTHALMIC | Status: AC
Start: 2013-12-12 — End: 2013-12-12
  Filled 2013-12-12: qty 3.5

## 2013-12-12 MED ORDER — ONDANSETRON HCL 4 MG/2ML IJ SOLN: 4.0000 mg | Freq: Once | INTRAMUSCULAR | Status: DC | PRN

## 2013-12-12 MED ORDER — MIDAZOLAM HCL 2 MG/2ML IJ SOLN
INTRAMUSCULAR | Status: AC
  Filled 2013-12-12: qty 2

## 2013-12-12 MED ORDER — PHENYLEPHRINE HCL 10 MG/ML IJ SOLN
10.0000 mg | INTRAVENOUS | Status: DC | PRN
  Administered 2013-12-12: 25 ug/min via INTRAVENOUS

## 2013-12-12 MED ORDER — PROPOFOL 10 MG/ML IV BOLUS
INTRAVENOUS | Status: AC
Start: 2013-12-12 — End: 2013-12-12
  Filled 2013-12-12: qty 20

## 2013-12-12 MED ORDER — LIDOCAINE HCL (CARDIAC) 20 MG/ML IV SOLN
INTRAVENOUS | Status: AC
  Filled 2013-12-12: qty 5

## 2013-12-12 MED ORDER — ALBUTEROL SULFATE (2.5 MG/3ML) 0.083% IN NEBU
INHALATION_SOLUTION | RESPIRATORY_TRACT | Status: AC
  Filled 2013-12-12: qty 3

## 2013-12-12 MED ORDER — NEOSTIGMINE METHYLSULFATE 10 MG/10ML IV SOLN
INTRAVENOUS | Status: DC | PRN
  Administered 2013-12-12: 3 mg via INTRAVENOUS

## 2013-12-12 MED ORDER — EPHEDRINE SULFATE 50 MG/ML IJ SOLN
INTRAMUSCULAR | Status: AC
  Filled 2013-12-12: qty 1

## 2013-12-12 MED ORDER — PROPOFOL 10 MG/ML IV BOLUS
INTRAVENOUS | Status: AC
  Filled 2013-12-12: qty 20

## 2013-12-12 MED ORDER — ONDANSETRON HCL 4 MG/2ML IJ SOLN
INTRAMUSCULAR | Status: AC
  Filled 2013-12-12: qty 2

## 2013-12-12 MED ORDER — ROCURONIUM BROMIDE 50 MG/5ML IV SOLN
INTRAVENOUS | Status: AC
Start: 2013-12-12 — End: 2013-12-12
  Filled 2013-12-12: qty 1

## 2013-12-12 MED ORDER — PHENYLEPHRINE HCL 10 MG/ML IJ SOLN
INTRAMUSCULAR | Status: DC | PRN
  Administered 2013-12-12: 80 ug via INTRAVENOUS
  Administered 2013-12-12: 40 ug via INTRAVENOUS

## 2013-12-12 MED ORDER — GLYCOPYRROLATE 0.2 MG/ML IJ SOLN
INTRAMUSCULAR | Status: AC
  Filled 2013-12-12: qty 2

## 2013-12-12 MED ORDER — GLYCOPYRROLATE 0.2 MG/ML IJ SOLN
INTRAMUSCULAR | Status: DC | PRN
  Administered 2013-12-12: 0.4 mg via INTRAVENOUS

## 2013-12-12 MED ORDER — LACTATED RINGERS IV SOLN
INTRAVENOUS | Status: DC | PRN
  Administered 2013-12-12 (×2): via INTRAVENOUS

## 2013-12-12 MED ORDER — ARTIFICIAL TEARS OP OINT
TOPICAL_OINTMENT | OPHTHALMIC | Status: DC | PRN
  Administered 2013-12-12: 1 via OPHTHALMIC

## 2013-12-12 MED ORDER — SUCCINYLCHOLINE CHLORIDE 20 MG/ML IJ SOLN
INTRAMUSCULAR | Status: AC
  Filled 2013-12-12: qty 1

## 2013-12-12 MED ORDER — MIDAZOLAM HCL 5 MG/5ML IJ SOLN
INTRAMUSCULAR | Status: DC | PRN
  Administered 2013-12-12: 1 mg via INTRAVENOUS

## 2013-12-12 MED ORDER — EPINEPHRINE HCL 1 MG/ML IJ SOLN
INTRAMUSCULAR | Status: AC
  Filled 2013-12-12: qty 1

## 2013-12-12 MED ORDER — ROCURONIUM BROMIDE 100 MG/10ML IV SOLN
INTRAVENOUS | Status: DC | PRN
  Administered 2013-12-12: 20 mg via INTRAVENOUS

## 2013-12-12 MED ORDER — NEOSTIGMINE METHYLSULFATE 10 MG/10ML IV SOLN
INTRAVENOUS | Status: AC
  Filled 2013-12-12: qty 1

## 2013-12-12 MED ORDER — LIDOCAINE HCL (CARDIAC) 20 MG/ML IV SOLN
INTRAVENOUS | Status: DC | PRN
  Administered 2013-12-12: 70 mg via INTRAVENOUS
  Administered 2013-12-12: 100 mg via INTRATRACHEAL

## 2013-12-12 MED ORDER — ALBUTEROL SULFATE (2.5 MG/3ML) 0.083% IN NEBU
2.5000 mg | INHALATION_SOLUTION | Freq: Once | RESPIRATORY_TRACT | Status: AC
  Administered 2013-12-12: 2.5 mg via RESPIRATORY_TRACT

## 2013-12-12 MED ORDER — FENTANYL CITRATE 0.05 MG/ML IJ SOLN
INTRAMUSCULAR | Status: DC | PRN
  Administered 2013-12-12: 25 ug via INTRAVENOUS
  Administered 2013-12-12: 75 ug via INTRAVENOUS

## 2013-12-12 MED ORDER — ONDANSETRON HCL 4 MG/2ML IJ SOLN
INTRAMUSCULAR | Status: DC | PRN
  Administered 2013-12-12: 4 mg via INTRAVENOUS

## 2013-12-12 MED ORDER — HYDROMORPHONE HCL PF 1 MG/ML IJ SOLN: 0.2500 mg | INTRAMUSCULAR | Status: DC | PRN

## 2013-12-12 MED ORDER — SUCCINYLCHOLINE CHLORIDE 20 MG/ML IJ SOLN
INTRAMUSCULAR | Status: DC | PRN
  Administered 2013-12-12: 120 mg via INTRAVENOUS

## 2013-12-12 MED ORDER — PROPOFOL 10 MG/ML IV BOLUS
INTRAVENOUS | Status: DC | PRN
  Administered 2013-12-12: 200 mg via INTRAVENOUS
  Administered 2013-12-12: 30 mg via INTRAVENOUS
  Administered 2013-12-12: 40 mg via INTRAVENOUS
  Administered 2013-12-12 (×2): 30 mg via INTRAVENOUS

## 2013-12-12 MED ORDER — SODIUM CHLORIDE 0.9 % IJ SOLN
INTRAMUSCULAR | Status: AC
  Filled 2013-12-12: qty 10

## 2013-12-12 SURGICAL SUPPLY — 46 items
BALL CTTN LRG ABS STRL LF (GAUZE/BANDAGES/DRESSINGS)
BLADE 10 SAFETY STRL DISP (BLADE) ×6 IMPLANT
BRUSH CYTOL CELLEBRITY 1.5X140 (MISCELLANEOUS) IMPLANT
BRUSH SUPERTRAX BIOPSY (INSTRUMENTS) ×2 IMPLANT
BRUSH SUPERTRAX NDL-TIP CYTO (INSTRUMENTS) ×2 IMPLANT
CANISTER SUCTION 2500CC (MISCELLANEOUS) ×6 IMPLANT
CHANNEL WORK EXTEND EDGE 180 (KITS) ×2 IMPLANT
CHANNEL WORK EXTEND EDGE 45 (KITS) IMPLANT
CHANNEL WORK EXTEND EDGE 90 (KITS) IMPLANT
CONT SPEC 4OZ CLIKSEAL STRL BL (MISCELLANEOUS) ×9 IMPLANT
COTTONBALL LRG STERILE PKG (GAUZE/BANDAGES/DRESSINGS) IMPLANT
COVER TABLE BACK 60X90 (DRAPES) ×6 IMPLANT
FILTER STRAW FLUID ASPIR (MISCELLANEOUS) IMPLANT
FORCEPS BIOP RJ4 1.8 (CUTTING FORCEPS) IMPLANT
FORCEPS BIOP SUPERTRX PREMAR (INSTRUMENTS) IMPLANT
GLOVE SURG SIGNA 7.5 PF LTX (GLOVE) ×6 IMPLANT
GOWN STRL REUS W/ TWL XL LVL3 (GOWN DISPOSABLE) ×2 IMPLANT
GOWN STRL REUS W/TWL XL LVL3 (GOWN DISPOSABLE) ×6
KIT PROCEDURE EDGE 180 (KITS) ×2 IMPLANT
KIT PROCEDURE EDGE 45 (KITS) IMPLANT
KIT PROCEDURE EDGE 90 (KITS) IMPLANT
KIT ROOM TURNOVER OR (KITS) ×6 IMPLANT
MARKER SKIN DUAL TIP RULER LAB (MISCELLANEOUS) ×6 IMPLANT
NDL BIOPSY TRANSBRONCH 21G (NEEDLE) IMPLANT
NDL BLUNT 18X1 FOR OR ONLY (NEEDLE) IMPLANT
NDL SUPERTRX PREMARK BIOPSY (NEEDLE) IMPLANT
NEEDLE 22X1 1/2 (OR ONLY) (NEEDLE) IMPLANT
NEEDLE BIOPSY TRANSBRONCH 21G (NEEDLE) IMPLANT
NEEDLE BLUNT 18X1 FOR OR ONLY (NEEDLE) IMPLANT
NEEDLE SUPERTRX PREMARK BIOPSY (NEEDLE) ×3 IMPLANT
NEEDLE SYS SONOTIP II EBUSTBNA (NEEDLE) ×5 IMPLANT
NS IRRIG 1000ML POUR BTL (IV SOLUTION) ×6 IMPLANT
OIL SILICONE PENTAX (PARTS (SERVICE/REPAIRS)) ×3 IMPLANT
PAD ARMBOARD 7.5X6 YLW CONV (MISCELLANEOUS) ×12 IMPLANT
PATCHES PATIENT (LABEL) ×9 IMPLANT
SPONGE GAUZE 4X4 12PLY (GAUZE/BANDAGES/DRESSINGS) ×3 IMPLANT
SYR 20CC LL (SYRINGE) ×6 IMPLANT
SYR 20ML ECCENTRIC (SYRINGE) ×6 IMPLANT
SYR 30ML LL (SYRINGE) ×3 IMPLANT
SYR 5ML LL (SYRINGE) ×6 IMPLANT
SYR 5ML LUER SLIP (SYRINGE) ×3 IMPLANT
SYR CONTROL 10ML LL (SYRINGE) IMPLANT
TOWEL OR 17X24 6PK STRL BLUE (TOWEL DISPOSABLE) ×6 IMPLANT
TRAP SPECIMEN MUCOUS 40CC (MISCELLANEOUS) ×6 IMPLANT
TUBE CONNECTING 12'X1/4 (SUCTIONS) ×3
TUBE CONNECTING 12X1/4 (SUCTIONS) ×6 IMPLANT

## 2013-12-12 NOTE — Anesthesia Postprocedure Evaluation (Signed)
  Anesthesia Post-op Note  Patient: Carlos Goodwin  Procedure(s) Performed: Procedure(s): VIDEO BRONCHOSCOPY WITH ENDOBRONCHIAL NAVIGATION (N/A) VIDEO BRONCHOSCOPY WITH ENDOBRONCHIAL ULTRASOUND (N/A)  Patient Location: PACU  Anesthesia Type:General  Level of Consciousness: awake, alert , oriented and patient cooperative  Airway and Oxygen Therapy: Patient Spontanous Breathing  Post-op Pain: none  Post-op Assessment: Post-op Vital signs reviewed, Patient's Cardiovascular Status Stable, Respiratory Function Stable, Patent Airway and No signs of Nausea or vomiting  Post-op Vital Signs: stable  Last Vitals:  Filed Vitals:   12/12/13 1115  BP:   Pulse: 80  Temp:   Resp: 18    Complications: No apparent anesthesia complications

## 2013-12-12 NOTE — Discharge Instructions (Signed)
Do not drive or engage in heavy physical activity for 24 hours  You may resume normal activities tomorrow  You may cough up small amounts of blood over the next few days  Call if you develop chest pain, fever > 101, or cough up large amounts of blood

## 2013-12-12 NOTE — Interval H&P Note (Signed)
History and Physical Interval Note:  12/12/2013 7:21 AM  Carlos Goodwin  has presented today for surgery, with the diagnosis of LUNG MASS  The various methods of treatment have been discussed with the patient and family. After consideration of risks, benefits and other options for treatment, the patient has consented to  Procedure(s): VIDEO BRONCHOSCOPY WITH ENDOBRONCHIAL NAVIGATION (N/A) VIDEO BRONCHOSCOPY WITH ENDOBRONCHIAL ULTRASOUND (N/A) as a surgical intervention .  The patient's history has been reviewed, patient examined, no change in status, stable for surgery.  I have reviewed the patient's chart and labs.  Questions were answered to the patient's satisfaction.     Melrose Nakayama

## 2013-12-12 NOTE — H&P (View-Only) (Signed)
PCP is Alonza Bogus, MD Referring Provider is Alonza Bogus, MD  No chief complaint on file.   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 He went to Dr. Luan Pulling and was diagnosed with cellulitis. An MR of the neck was done and showed a right apical lung mass. A CT of the chest was done which showed a 3.8 x 3.6 .4.8 cm mass in the right upper lobe abutting the mediastinum. There was a second smaller mass adjacent to it as well as hilar and mediastinal adenopathy.   He had been feeling well prior to noticing the neck swelling. He has an occasional cough, but it has not changed recently. He denies hemoptysis. He denies weight loss, new bone or joint pain, headaches and visual changes.  ECOG/ZUBROD= 0   Past Medical History  Diagnosis Date  . COPD (chronic obstructive pulmonary disease)   . Coronary artery disease   . Sleep apnea   . Hypertension   . High cholesterol   . MI (myocardial infarction) 2004  . GERD (gastroesophageal reflux disease)     Past Surgical History  Procedure Laterality Date  . Appendectomy    . Coronary angioplasty  2004    1 stent  . Anterior cruciate ligament repair Right   . Tonsillectomy    . Prostate biopsy N/A 06/13/2013    Procedure: ULTRASOUND GUIDED PROSTATE BIOPSY;  Surgeon: Marissa Nestle, MD;  Location: [facility];  Service: Urology;  Laterality: N/A;    Family History  Problem Relation Age of Onset  . Diabetes Mother   . Cancer Father     Social History History  Substance Use Topics  . Smoking status: Current Every Day Smoker -- 1.00 packs/day for 50 years    Types: Cigarettes  . Smokeless tobacco: Not on file  . Alcohol Use: No     Comment: social alcohol    Current Outpatient Prescriptions  Medication Sig Dispense Refill  . aspirin EC 81 MG EC tablet Take 1 tablet (81 mg total) by mouth daily.  30 tablet  0  .  ciprofloxacin (CIPRO) 250 MG tablet Take 1 tablet (250 mg total) by mouth 2 (two) times daily.  6 tablet  0  . metoprolol succinate (TOPROL-XL) 100 MG 24 hr tablet Take 100 mg by mouth daily. Take with or immediately following a meal.      . simvastatin (ZOCOR) 40 MG tablet Take 40 mg by mouth every evening.      . tiotropium (SPIRIVA) 18 MCG inhalation capsule Place 18 mcg into inhaler and inhale daily.       No current facility-administered medications for this visit.    Allergies  Allergen Reactions  . Other Rash    States that in 2004 he was placed on a blood thinner that made him break out in a rash.?plavix     Review of Systems  Constitutional: Negative for activity change, fatigue and unexpected weight change.  HENT:       Swelling in neck  Respiratory: Positive for apnea, cough and wheezing.   Cardiovascular: Negative for chest pain.  Gastrointestinal: Positive for constipation.  Neurological: Negative.   Hematological: Negative for adenopathy.  All other systems reviewed and are negative.   BP 139/70  Pulse 63  Temp(Src) 97 F (36.1 C)  Resp 18  Wt 137 lb 14.4 oz (62.551 kg)  SpO2 98% Physical  Exam  Vitals reviewed. Constitutional: He is oriented to person, place, and time. He appears well-developed and well-nourished. No distress.  obese  HENT:  Head: Normocephalic and atraumatic.  Eyes: EOM are normal. Pupils are equal, round, and reactive to light.  Neck: Neck supple. No tracheal deviation present.  Cardiovascular: Normal rate, regular rhythm, normal heart sounds and intact distal pulses.  Exam reveals no friction rub.   No murmur heard. Pulmonary/Chest: Effort normal and breath sounds normal. He has no wheezes. He has no rales.  Abdominal: Soft. There is no tenderness.  Musculoskeletal: He exhibits no edema.  Lymphadenopathy:    He has no cervical adenopathy.  Neurological: He is alert and oriented to person, place, and time. No cranial nerve deficit.   Skin: Skin is warm and dry.  Psychiatric: He has a normal mood and affect.     Diagnostic Tests: CT CHEST WITHOUT CONTRAST  TECHNIQUE:  Multidetector CT imaging of the chest was performed following the  standard protocol without IV contrast.  COMPARISON: Partial comparison to CT neck dated 11/18/2013  FINDINGS:  3.9 x 3.8 x 4.6 cm mass in the medial right lung apex (series  5/image 10), suspicious for primary bronchogenic neoplasm.  No definite invasion of the chest wall or adjacent right 1st/2nd  ribs.  Additional 2.6 x 1.6 cm nodule in the central right upper lobe  (series 5/ image 18).  Calcified pleural plaques along the anterior right upper lobe  (series 5/ image 20) and left lung base (series 5/ image 52),  suggesting asbestos related pleural disease. Mild  fibrosis/parenchymal banding with volume loss in the left lower lobe  (series 5/image 51).  No pleural effusion or pneumothorax.  Visualized thyroid is unremarkable.  The heart is normal in size. No pericardial effusion. Coronary  atherosclerosis with LAD stent. Atherosclerotic calcifications of  the aortic arch.  Thoracic lymphadenopathy, including:  --8 mm short axis right supraclavicular node (series 2/image 3),  indeterminate  --Right paratracheal nodes measuring up to 1.6 cm short axis (series  2/ image 20)  --2.5 cm short axis right hilar node (series 2/ image 29)  --1.1 cm short axis subcarinal node (series 2/image 30)  Visualized upper abdomen is unremarkable. Bilateral adrenal glands  were not imaged.  Degenerative changes of the visualized thoracolumbar spine.  IMPRESSION:  4.6 cm mass in the medial right lung apex, suspicious for primary  bronchogenic neoplasm.  Additional 2.6 cm nodule in the central right upper lobe.  Mediastinal/right hilar lymphadenopathy, suspicious for nodal  metastases, as above.  Asbestos related pleural disease.  Electronically Signed  By: Julian Hy M.D.  On:  12/06/2013 14:48  PFT FVC  2.45(53%) FEV1  1.76(50%) FEV1 post 2.08(60%) DLCO  72%  Impression: 63 yo man with a history of tobacco abuse who presents with a new right apical mass. Ct of the chest shows hilar and mediastinal adenopathy. 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 He needs a tissue diagnosis so that appropriate treatment can be initiated.  I had a long discussion with Mr. Ewing and his family and reviewed the films with them.   I recommended that we proceed with a navigational bronchoscopy and EBUS for diagnostic and staging purposes. I described the procedure to them including the use of general anesthesia. We discussed the indications, risks, benefits and alternatives. They understand the risks include those associated with anesthesia, bleeding, pneumothorax, injury to adjacent intrathoracic structures, and failure to make a diagnosis.  He will also need  a PET/CT and MR of the brain to complete staging  Plan: PET/CT  MR Brain  ENB/ EBUS on Tuesday 12/12/2013

## 2013-12-12 NOTE — Brief Op Note (Signed)
12/12/2013  9:43 AM  PATIENT:  Carlos Goodwin  63 y.o. male  PRE-OPERATIVE DIAGNOSIS:  LUNG MASS  POST-OPERATIVE DIAGNOSIS:  LUNG MASS  PROCEDURE:  Procedure(s): VIDEO BRONCHOSCOPY WITH ENDOBRONCHIAL NAVIGATION (N/A) VIDEO BRONCHOSCOPY WITH ENDOBRONCHIAL ULTRASOUND (N/A)  SURGEON:  Surgeon(s) and Role:    * Melrose Nakayama, MD - Primary  PHYSICIAN ASSISTANT:   ASSISTANTS: none   ANESTHESIA:   general  EBL:  Total I/O In: 1000 [I.V.:1000] Out: -   BLOOD ADMINISTERED:none  DRAINS: none   LOCAL MEDICATIONS USED:  NONE  SPECIMEN:  Source of Specimen:  4R and 7 Lymph nodes, RUL mass  DISPOSITION OF SPECIMEN:  PATHOLOGY  COUNTS:  NO not indicated   PLAN OF CARE: Discharge to home after PACU  PATIENT DISPOSITION:  PACU - hemodynamically stable.   Delay start of Pharmacological VTE agent (>24hrs) due to surgical blood loss or risk of bleeding: not applicable  4R and 7 nodes + non-small cell cancer

## 2013-12-12 NOTE — Transfer of Care (Signed)
Immediate Anesthesia Transfer of Care Note  Patient: Carlos Goodwin  Procedure(s) Performed: Procedure(s): VIDEO BRONCHOSCOPY WITH ENDOBRONCHIAL NAVIGATION (N/A) VIDEO BRONCHOSCOPY WITH ENDOBRONCHIAL ULTRASOUND (N/A)  Patient Location: PACU  Anesthesia Type:General  Level of Consciousness: awake, alert  and oriented  Airway & Oxygen Therapy: Patient Spontanous Breathing and Patient connected to nasal cannula oxygen  Post-op Assessment: Report given to PACU RN, Post -op Vital signs reviewed and stable and Patient moving all extremities X 4  Post vital signs: Reviewed and stable  Complications: No apparent anesthesia complications

## 2013-12-12 NOTE — Anesthesia Procedure Notes (Signed)
Procedure Name: Intubation Date/Time: 12/12/2013 7:30 AM Performed by: Carola Frost Pre-anesthesia Checklist: Patient identified, Timeout performed, Emergency Drugs available, Suction available and Patient being monitored Patient Re-evaluated:Patient Re-evaluated prior to inductionOxygen Delivery Method: Circle system utilized Preoxygenation: Pre-oxygenation with 100% oxygen Intubation Type: IV induction Ventilation: Mask ventilation without difficulty and Oral airway inserted - appropriate to patient size Laryngoscope Size: Mac and 4 Grade View: Grade III Tube type: Oral Tube size: 8.5 mm Number of attempts: 1 Airway Equipment and Method: Stylet and LTA kit utilized Placement Confirmation: CO2 detector,  positive ETCO2,  ETT inserted through vocal cords under direct vision and breath sounds checked- equal and bilateral Secured at: 24 cm Tube secured with: Tape Dental Injury: Teeth and Oropharynx as per pre-operative assessment

## 2013-12-12 NOTE — Telephone Encounter (Signed)
Called pt regarding appt for mtoc.  I spoke with pt wife.  She is aware of appt time and place.

## 2013-12-12 NOTE — Anesthesia Preprocedure Evaluation (Addendum)
Anesthesia Evaluation  Patient identified by MRN, date of birth, ID band Patient awake    Reviewed: Allergy & Precautions, H&P , NPO status , Patient's Chart, lab work & pertinent test results, reviewed documented beta blocker date and time   Airway Mallampati: II TM Distance: >3 FB Neck ROM: Full    Dental  (+) Dental Advisory Given, Poor Dentition, Missing   Pulmonary shortness of breath, sleep apnea , COPDCurrent Smoker,          Cardiovascular hypertension, Pt. on medications and Pt. on home beta blockers + CAD and + Past MI     Neuro/Psych    GI/Hepatic GERD-  Medicated,  Endo/Other    Renal/GU      Musculoskeletal   Abdominal   Peds  Hematology   Anesthesia Other Findings Lung mass  Reproductive/Obstetrics                         Anesthesia Physical Anesthesia Plan  ASA: III  Anesthesia Plan: General   Post-op Pain Management:    Induction: Intravenous  Airway Management Planned: Oral ETT  Additional Equipment:   Intra-op Plan:   Post-operative Plan: Extubation in OR  Informed Consent: I have reviewed the patients History and Physical, chart, labs and discussed the procedure including the risks, benefits and alternatives for the proposed anesthesia with the patient or authorized representative who has indicated his/her understanding and acceptance.   Dental advisory given  Plan Discussed with: Anesthesiologist and Surgeon  Anesthesia Plan Comments:         Anesthesia Quick Evaluation

## 2013-12-12 NOTE — OR Nursing (Signed)
08:34 - End of 1st procedure

## 2013-12-13 ENCOUNTER — Ambulatory Visit: Payer: Medicare Other | Admitting: Physical Therapy

## 2013-12-13 ENCOUNTER — Ambulatory Visit (HOSPITAL_COMMUNITY)
Admission: RE | Admit: 2013-12-13 | Discharge: 2013-12-13 | Disposition: A | Discharge status: Home / Self Care | Payer: Medicare Other | Source: Ambulatory Visit | Attending: Thoracic Surgery (Cardiothoracic Vascular Surgery) | Admitting: Thoracic Surgery (Cardiothoracic Vascular Surgery)

## 2013-12-13 DIAGNOSIS — J320 Chronic maxillary sinusitis: Secondary | ICD-10-CM | POA: Insufficient documentation

## 2013-12-13 DIAGNOSIS — R51 Headache: Secondary | ICD-10-CM

## 2013-12-13 DIAGNOSIS — C3490 Malignant neoplasm of unspecified part of unspecified bronchus or lung: Secondary | ICD-10-CM | POA: Insufficient documentation

## 2013-12-13 DIAGNOSIS — I6789 Other cerebrovascular disease: Secondary | ICD-10-CM | POA: Insufficient documentation

## 2013-12-13 MED ORDER — GADOBENATE DIMEGLUMINE 529 MG/ML IV SOLN
20.0000 mL | Freq: Once | INTRAVENOUS | Status: AC | PRN
  Administered 2013-12-13: 20 mL via INTRAVENOUS

## 2013-12-13 NOTE — Op Note (Signed)
NAME:  Carlos, Goodwin NO.:  [id number]  MEDICAL RECORD NO.:  [id number]  LOCATION:  [facility]                         FACILITY:  [facility]  PHYSICIAN:  Revonda Standard. Rhythm Wigfall, M.D.DATE OF BIRTH:  [birth year]  DATE OF PROCEDURE:  12/12/2013 DATE OF DISCHARGE:  12/12/2013                              OPERATIVE REPORT   PREOPERATIVE DIAGNOSIS:  Right upper lobe mass with hilar and mediastinal adenopathy.  POSTOPERATIVE DIAGNOSIS:  Non-small cell carcinoma, clinical stage IIIA.  PROCEDURE: 1. Video bronchoscopy. 2. Endobronchial ultrasound with mediastinal lymph node aspiration. 3. Electromagnetic navigational bronchoscopy with brushings, biopsies,     needle aspirations, and washings.  SURGEON:  Revonda Standard. Roxan Hockey, M.D.  ASSISTANT:  None.  ANESTHESIA:  General.  FINDINGS:  Quick prep of aspirations from level 4R and 7 nodes showed non-small cell carcinoma.  CLINICAL NOTE:  Carlos Goodwin is a 63 year old gentleman with history of tobacco abuse.  He presented with neck swelling.  A CT of the neck showed a right apical mass.  A CT of the chest then was done and it showed a 3.8 x 3.6 x 4.8 cm mass in the right upper lobe.  There was a second smaller mass adjacent to this as well as hilar and mediastinal adenopathy.  On PET, these lesions were hypermetabolic.  He was advised to undergo bronchoscopy with navigation and endobronchial ultrasound for diagnostic and staging purposes.  The indications, risks, benefits, and alternatives were discussed in detail with the patient.  He understood and accepted the risks and agreed to proceed.  OPERATIVE NOTE:  Carlos Goodwin was brought to the operating room on Dec 12, 2013.  He had induction of general anesthesia and was intubated. Flexible fiberoptic bronchoscopy was performed via the endotracheal tube.  It revealed normal endobronchial anatomy and no endobronchial lesions.  The endobronchial ultrasound probe then was advanced and  a systematic inspection of the mediastinal lymph nodes was carried out.  There were enlarged nodes in the 4R and 7 locations.  Aspirations were performed of these nodes and a portion of the aspirations were sent for quick prep. The remainder of the aspirations were sent in [place] for permanent cytology and molecular testing.  The quick prep of the lesions came back showing non-small cell carcinoma.  The endobronchial ultrasound probe was removed.  Next, the locatable guide for the navigational bronchoscopy was advanced through the regular video bronchoscope.  Mapping was performed.  There was good correlation of the virtual and actual bronchoscopies.  The 180-degree sheath then was advanced to the appropriate bronchus as indicated by the navigation software, and multiple specimens were obtained from the right upper lobe mass. This was done primarily to have sufficient tissue for molecular testing. Brushings, biopsies, needle aspirations, and washings were performed from several different sites.  After adequate samples had been performed, the sheath was removed.  The final inspection was made with the bronchoscope.  There was no ongoing bleeding.  The patient then was extubated in the operating room and taken to the postanesthetic care unit in good condition.     Revonda Standard Roxan Hockey, M.D.     SCH/MEDQ  D:  12/12/2013  T:  12/13/2013  Job:  [id number]

## 2013-12-14 ENCOUNTER — Encounter: Payer: Self-pay | Admitting: *Deleted

## 2013-12-14 ENCOUNTER — Ambulatory Visit: Payer: Medicare Other

## 2013-12-14 ENCOUNTER — Ambulatory Visit (HOSPITAL_BASED_OUTPATIENT_CLINIC_OR_DEPARTMENT_OTHER): Payer: Medicare Other | Admitting: Internal Medicine

## 2013-12-14 ENCOUNTER — Telehealth: Payer: Self-pay | Admitting: Internal Medicine

## 2013-12-14 ENCOUNTER — Ambulatory Visit: Payer: Medicare Other | Attending: Internal Medicine | Admitting: Physical Therapy

## 2013-12-14 ENCOUNTER — Ambulatory Visit
Admission: RE | Admit: 2013-12-14 | Discharge: 2013-12-14 | Disposition: A | Discharge status: Home / Self Care | Payer: Medicare Other | Source: Ambulatory Visit | Attending: Radiation Oncology | Admitting: Radiation Oncology

## 2013-12-14 ENCOUNTER — Encounter: Payer: Self-pay | Admitting: Radiation Oncology

## 2013-12-14 ENCOUNTER — Encounter: Payer: Self-pay | Admitting: Internal Medicine

## 2013-12-14 VITALS — BP 155/88 | HR 79 | Temp 98.2°F | Resp 20 | Ht 70.0 in | Wt 274.6 lb

## 2013-12-14 DIAGNOSIS — C3410 Malignant neoplasm of upper lobe, unspecified bronchus or lung: Secondary | ICD-10-CM

## 2013-12-14 DIAGNOSIS — C781 Secondary malignant neoplasm of mediastinum: Secondary | ICD-10-CM

## 2013-12-14 DIAGNOSIS — IMO0001 Reserved for inherently not codable concepts without codable children: Secondary | ICD-10-CM | POA: Insufficient documentation

## 2013-12-14 DIAGNOSIS — C3491 Malignant neoplasm of unspecified part of right bronchus or lung: Secondary | ICD-10-CM | POA: Insufficient documentation

## 2013-12-14 DIAGNOSIS — C3490 Malignant neoplasm of unspecified part of unspecified bronchus or lung: Secondary | ICD-10-CM | POA: Insufficient documentation

## 2013-12-14 DIAGNOSIS — R293 Abnormal posture: Secondary | ICD-10-CM | POA: Insufficient documentation

## 2013-12-14 MED ORDER — PROCHLORPERAZINE MALEATE 10 MG PO TABS: 10.0000 mg | ORAL_TABLET | Freq: Four times a day (QID) | ORAL | Status: AC | PRN

## 2013-12-14 NOTE — Telephone Encounter (Signed)
Gave pt appt for lab,md and chemo class, eamiled michelle regarding chemo

## 2013-12-14 NOTE — Progress Notes (Signed)
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 male who presented to his primary care physician with redness and swelling of the neck. The patient underwent neck CT of April 21 for evaluation. This showed evidence of submental cellulitis. Moreover, the study showed a right apical lung mass worrisome for bronchogenic carcinoma. Subsequent chest CT on 12/06/2013 demonstrated a 4.6 cm right medial apical lung mass with an additional 2.6 cm nodule in the right upper lobe in addition to mediastinal and right hilar lymphadenopathy.  Subsequent PET/CT on may first showed the right apical mass tenderness to the max of 25.7 with malignant range adenopathy in the right hilum, right paratracheal space, and subcarinal region.  Bronchoscopy with endobronchial ultrasound guided biopsies on may fifth revealed adenocarcinoma. Brain MRI shows new brain metastases. The patient's radiology studies and pathology slides were presented at our multidisciplinary conference earlier today and he presents to clinic for multidisciplinary evaluation.  PREVIOUS RADIATION THERAPY: No  PAST MEDICAL HISTORY:  has a past medical history of COPD (chronic obstructive pulmonary disease); Coronary artery disease; Sleep apnea; Hypertension; High cholesterol; MI (myocardial infarction) (2004); GERD (gastroesophageal reflux disease); Family history of anesthesia complication; Shortness of breath; and Cancer.    PAST SURGICAL HISTORY: Past Surgical History  Procedure Laterality Date  . Appendectomy     . Coronary angioplasty  2004    1 stent  . Anterior cruciate ligament repair Right   . Tonsillectomy    . Prostate biopsy N/A 06/13/2013    Procedure: ULTRASOUND GUIDED PROSTATE BIOPSY;  Surgeon: Marissa Nestle, MD;  Location: [facility];  Service: Urology;  Laterality: N/A;    FAMILY HISTORY: family history includes Cancer in his father; Diabetes in his mother.  SOCIAL HISTORY:  reports that he has been smoking Cigarettes.  He has a 50 pack-year smoking history. He has never used smokeless tobacco. He reports that he does not drink alcohol or use illicit drugs.  ALLERGIES: Plavix and Other  MEDICATIONS:  Current Outpatient Prescriptions  Medication Sig Dispense Refill  . albuterol (PROVENTIL HFA;VENTOLIN HFA) 108 (90 BASE) MCG/ACT inhaler Inhale 1-2 puffs into the lungs every 6 (six) hours as needed for wheezing or shortness of breath.      Marland Kitchen amoxicillin (AMOXIL) 500 MG capsule Take 500 mg by mouth.      Marland Kitchen aspirin EC 81 MG tablet Take 81 mg by mouth at bedtime.      . metoprolol (LOPRESSOR) 100 MG tablet Take 100 mg by mouth at bedtime.      . prochlorperazine (COMPAZINE) 10 MG tablet Take 1 tablet (10 mg total) by mouth every 6 (six) hours as needed for nausea or vomiting.  60 tablet  0  . ranitidine (ZANTAC) 150 MG tablet Take 150 mg by mouth daily.      . simvastatin (ZOCOR) 40 MG tablet Take 40 mg by mouth every evening.      . tiotropium (SPIRIVA) 18 MCG inhalation capsule Place 18 mcg into inhaler and inhale daily.       No current facility-administered medications for this encounter.  REVIEW OF SYSTEMS:  A 15 point review of systems is documented in the electronic medical record. This was obtained by the nursing staff. However, I reviewed this with the patient to discuss relevant findings and make appropriate changes.  A comprehensive review of systems was negative.   PHYSICAL EXAM: Per thoracic surgery on 5/5, BP 139/70  Pulse 63  Temp(Src) 97 F (36.1 C)  Resp 18  Wt  137 lb 14.4 oz (62.551 kg)  SpO2 98%  Physical Exam  Vitals reviewed. Constitutional: He is oriented to person, place, and time. He appears well-developed and well-nourished. No distress. HENT: Head: Normocephalic and atraumatic. Eyes: EOM are normal. Pupils are equal, round, and reactive to light. Neck: Neck supple. No tracheal deviation present. Cardiovascular: Normal rate, regular rhythm, normal heart sounds and intact distal pulses. Exam reveals no friction rub. No murmur heard. Pulmonary/Chest: Effort normal and breath sounds normal. He has no wheezes. He has no rales. Abdominal: Soft. There is no tenderness. Musculoskeletal: He exhibits no edema. Lymphadenopathy: He has no cervical adenopathy. Neurological: He is alert and oriented to person, place, and time. No cranial nerve deficit. Skin: Skin is warm and dry. Psychiatric: He has a normal mood and affect  KPS = 100  100 - Normal; no complaints; no evidence of disease. 90   - Able to carry on normal activity; minor signs or symptoms of disease. 80   - Normal activity with effort; some signs or symptoms of disease. 61   - Cares for self; unable to carry on normal activity or to do active work. 60   - Requires occasional assistance, but is able to care for most of his personal needs. 50   - Requires considerable assistance and frequent medical care. 70   - Disabled; requires special care and assistance. 15   - Severely disabled; hospital admission is indicated although death not imminent. 77   - Very sick; hospital admission necessary; active supportive treatment necessary. 10   - Moribund; fatal processes progressing rapidly. 0     - Dead  Karnofsky DA, Abelmann [place], Craver LS and Burchenal [facility] [phone]) The use of the nitrogen mustards in the palliative treatment of carcinoma: with particular reference to bronchogenic carcinoma Cancer 1 634-56  LABORATORY DATA:  Lab Results  Component Value Date   WBC 9.7 12/08/2013   HGB 18.0* 12/12/2013    HCT 53.0* 12/12/2013   MCV 88.3 12/08/2013   PLT 256 12/08/2013   Lab Results  Component Value Date   NA 141 12/12/2013   K 4.6 12/12/2013   CL 101 12/08/2013   CO2 30 12/08/2013   No results found for this basename: ALT, AST, GGT, ALKPHOS, BILITOT    PULMONARY FUNCTION TEST:   Results Pulmonary function test (Order [id number])         Pulmonary function test   Status: Final result Visible to patient: This result is not viewable by the patient. Next appt: 12/18/2013 at 08:00 AM in Radiation Oncology [facility]) Dx: Tobacco abuse           Ref Range 1wk ago    FVC-Pre L 2.45     FVC-%Pred-Pre % 53     FVC-Post L 2.56     FVC-%Pred-Post % 55     FVC-%Change-Post % 4     FEV1-Pre L 1.76     FEV1-%Pred-Pre % 50     FEV1-Post L 2.08     FEV1-%Pred-Post % 60     FEV1-%Change-Post % 18  FEV6-Pre L 2.45     FEV6-%Pred-Pre % 55     FEV6-Post L 2.56     FEV6-%Pred-Post % 58     FEV6-%Change-Post % 4     Pre FEV1/FVC ratio % 72     FEV1FVC-%Pred-Pre % 95     Post FEV1/FVC ratio % 81     FEV1FVC-%Change-Post % 13     Pre FEV6/FVC Ratio % 100     FEV6FVC-%Pred-Pre % 105     Post FEV6/FVC ratio % 100     FEV6FVC-%Pred-Post % 105     FEF 25-75 Pre L/sec 1.20     FEF2575-%Pred-Pre % 43     FEF 25-75 Post L/sec 2.19     FEF2575-%Pred-Post % 79     FEF2575-%Change-Post % 81     RV L 3.29     RV % pred % 141     TLC L 6.32     TLC % pred % 90     DLCO unc ml/min/mmHg 23.60     DLCO unc % pred % 72     DL/VA ml/min/mmHg/L 5.12     DL/VA % pred % 111     Resulting Agency [facility]    Specimen Collected: 12/04/13 9:41 AM Last Resulted: 12/04/13 9:41 AM      RADIOGRAPHY: Dg Chest 2 View  12/08/2013   CLINICAL DATA:  Lung mass.  EXAM: CHEST  2 VIEW  COMPARISON:  Chest x-rays dated 03/07/2013 and 07/03/2009 and chest CT dated 12/06/2013  FINDINGS: There is an ill-defined mass at the right lung apex medially. There is slight fullness on the right hilum. There are multiple pleural  calcifications bilaterally. There is chronic pleural thickening at the left lung base. Heart size and pulmonary vascularity are normal.  No effusions.  No acute osseous abnormality.  IMPRESSION: Subtle poorly defined mass in the right lung apex. Slight fullness of the right hilum consistent with adenopathy demonstrated on the CT scan.  Stable chronic pleural changes.   Electronically Signed   By: Rozetta Nunnery M.D.   On: 12/08/2013 11:14   Ct Soft Tissue Neck W Contrast  11/29/2013   ADDENDUM REPORT: 11/29/2013 09:59  ADDENDUM: Study discussed by telephone with EDWARD HAWKINS on 11/29/2013 at 09:50 .   Electronically Signed   By: Lars Pinks M.D.   On: 11/29/2013 09:59   11/29/2013   CLINICAL DATA:  63 year old male with redness and swelling of the neck since Sunday. Initial encounter.  EXAM: CT NECK WITH CONTRAST  TECHNIQUE: Multidetector CT imaging of the neck was performed using the standard protocol following the bolus administration of intravenous contrast.  CONTRAST:  30mL OMNIPAQUE IOHEXOL 300 MG/ML  SOLN  COMPARISON:  Neck ultrasound 11/27/2013.  FINDINGS: Confluent masslike opacity in the right lung apex extending to the apical pleura and inseparable from the margin of the right mediastinum. This encompasses 40 x 39 x 44 mm (AP by transverse by CC). There is associated mediastinal lymphadenopathy, with enlarged individual nodes 17 mm short axis. No upper thoracic osseous involvement identified.  Negative thyroid, larynx, pharynx, parapharyngeal spaces, retropharyngeal space (retropharyngeal course of the left carotid noted), submandibular glands and parotid glands. Negative visualized posterior fossa brain parenchyma. Suboptimal intravascular contrast timing, grossly patent major vascular structures in the neck.  There is a conspicuous increased number of subcentimeter lymph nodes at the bilateral level 4 nodal stations. There is also at 10 mm asymmetric left level IIIb node (series 2, image 56).  The  palpable  area of concern is in the submental region where there is subcutaneous stranding and mild enlargement of level 1A nodes (8-9 mm short axis) plus mild thickening of the platysma. No fluid collection. There is overlying skin thickening. The submandibular glands are nearby but appear normal. No silolithiasis identified.  No other soft tissue inflammation in the neck.  Bubbly opacity in the right maxillary sinus. Other Visualized paranasal sinuses and mastoids are clear. No acute or suspicious osseous abnormality in the neck.  IMPRESSION: 1. Right apical lung mass. Partially visible mediastinal lymphadenopathy. Appearance most compatible with bronchogenic carcinoma with nodal involvement. 2. Increased number of subcentimeter nodes at the thoracic inlet are indeterminate, along with a 10 mm left level 3 node. 3. The above findings superior unrelated to the palpable area of concern which most resembles a submental cellulitis with mild reactive level 1 lymph nodes. I called the office of Dr. Sinda Du on 11/28/2013 at 20:20 hours and left a message. I will also attempt to contact Dr. Luan Pulling regarding the above on the morning of November 29, 2013.  Electronically Signed: By: Lars Pinks M.D. On: 11/28/2013 20:32   Ct Chest Wo Contrast  12/06/2013   CLINICAL DATA:  Follow-up lung nodules on CT neck  EXAM: CT CHEST WITHOUT CONTRAST  TECHNIQUE: Multidetector CT imaging of the chest was performed following the standard protocol without IV contrast.  COMPARISON:  Partial comparison to CT neck dated 11/18/2013  FINDINGS: 3.9 x 3.8 x 4.6 cm mass in the medial right lung apex (series 5/image 10), suspicious for primary bronchogenic neoplasm.  No definite invasion of the chest wall or adjacent right 1st/2nd ribs.  Additional 2.6 x 1.6 cm nodule in the central right upper lobe (series 5/ image 18).  Calcified pleural plaques along the anterior right upper lobe (series 5/ image 20) and left lung base (series 5/ image 52),  suggesting asbestos related pleural disease. Mild fibrosis/parenchymal banding with volume loss in the left lower lobe (series 5/image 51).  No pleural effusion or pneumothorax.  Visualized thyroid is unremarkable.  The heart is normal in size. No pericardial effusion. Coronary atherosclerosis with LAD stent. Atherosclerotic calcifications of the aortic arch.  Thoracic lymphadenopathy, including:  --8 mm short axis right supraclavicular node (series 2/image 3), indeterminate  --Right paratracheal nodes measuring up to 1.6 cm short axis (series 2/ image 20)  --2.5 cm short axis right hilar node (series 2/ image 29)  --1.1 cm short axis subcarinal node (series 2/image 30)  Visualized upper abdomen is unremarkable. Bilateral adrenal glands were not imaged.  Degenerative changes of the visualized thoracolumbar spine.  IMPRESSION: 4.6 cm mass in the medial right lung apex, suspicious for primary bronchogenic neoplasm.  Additional 2.6 cm nodule in the central right upper lobe.  Mediastinal/right hilar lymphadenopathy, suspicious for nodal metastases, as above.  Asbestos related pleural disease.   Electronically Signed   By: Julian Hy M.D.   On: 12/06/2013 14:48   Mr Jeri Cos WJ Contrast  12/13/2013   CLINICAL DATA:  New diagnosis lung cancer.  Weakness.  EXAM: MRI HEAD WITHOUT AND WITH CONTRAST  TECHNIQUE: Multiplanar, multiecho pulse sequences of the brain and surrounding structures were obtained without and with intravenous contrast.  CONTRAST:  43mL MULTIHANCE GADOBENATE DIMEGLUMINE 529 MG/ML IV SOLN  COMPARISON:  None.  FINDINGS: Diffusion imaging does not show any acute or subacute infarction. There are minimal chronic small vessel changes within the pons. No cerebellar insult. The cerebral hemispheres show mild chronic small-vessel changes  within the deep and subcortical white matter. No cortical or large vessel territory abnormality. No evidence of primary or metastatic mass lesion. No pituitary mass. No  hydrocephalus or extra-axial collection. There is some mucosal inflammation of the right maxillary sinus with a small fluid level. No skull or skullbase lesion is seen.  IMPRESSION: No evidence of metastatic disease. Mild chronic small-vessel disease as outlined above. No acute finding.  Some inflammatory change of the right maxillary sinus.   Electronically Signed   By: Nelson Chimes M.D.   On: 12/13/2013 13:37   US Soft Tissue Head/neck  11/27/2013   CLINICAL DATA:  Anterior submental neck mass  EXAM: ULTRASOUND OF HEAD/NECK SOFT TISSUES  TECHNIQUE: Ultrasound examination of the head and neck soft tissues was performed in the area of clinical concern.  COMPARISON:  None  FINDINGS: Sonography of the site of palpable concern performed.  Thickening of these skin and subcutaneous tissues is seen at this site as compared to lateral aspects of the neck bilaterally.  Minimal subcutaneous edema at a single site but not throughout remainder of the palpable region.  No mass, abnormal fluid collection/cyst, or enlarged lymph node identified at the site of palpable abnormality.  I am unable to identify at the cause for the palpable finding at real-time imaging.  IMPRESSION: Skin thickening and thickening of subcutaneous tissues at this site of palpable concern at the submental region.  The palpable submental process is sonographically occult; if process fails to resolve, consider followup CT or MR imaging of the neck with contrast for further evaluation.  Discussed with Dr. Luan Pulling at time of exam.   Electronically Signed   By: Lavonia Dana M.D.   On: 11/27/2013 14:18   Nm Pet Image Initial (pi) Skull Base To Thigh  12/08/2013   CLINICAL DATA:  Initial treatment strategy for lung mass.  EXAM: NUCLEAR MEDICINE PET SKULL BASE TO THIGH  TECHNIQUE: 8.5 mCi F-18 FDG was injected intravenously. Full-ring PET imaging was performed from the skull base to thigh after the radiotracer. CT data was obtained and used for attenuation  correction and anatomic localization.  FASTING BLOOD GLUCOSE:  Value: 85 mg/dl  COMPARISON:  CT 12/06/2013  FINDINGS: NECK  Theskullbase is inadvertently omitted. The scan begins at the level of the inferior maxillary bone. There are no hypermetabolic nodes in the neck.  CHEST  There is a hypermetabolic right apical mass with SUV max equaled 25.7. This mass measures 3.3 x 3.8 cm and abuts the right upper mediastinum (image 34, series 4.  There is a hypermetabolic of right suprahilar nodule measuring 2.3 mm cm with SUV max 20.1.  There are hypermetabolic right paratracheal and subcarinal lymph nodes. Subcarinal lymph node is small measuring only 12 mm short axis with intensely metabolic with SUV maz equal 17.6. The highest hyper may right paratracheal lymph nodes measures 15 mm (image 43) with SUV max 15.5. No hypermetabolic supraclavicular lymph nodes.  ABDOMEN/PELVIS  No abnormal hypermetabolic activity within the liver, pancreas, adrenal glands, or spleen. No hypermetabolic lymph nodes in the abdomen or pelvis.  SKELETON  No focal hypermetabolic activity to suggest skeletal metastasis.  IMPRESSION: 1. Hypermetabolic right apical mass consistent with primary bronchogenic carcinoma. Mass abuts the pleural surface but does not appear to invade the mediastinum or chest wall. 2. Hypermetabolic right suprahilar hilar lymph node or nodule. 3. Hypermetabolic ipsilateral and subcarinal metastatic lymph nodes. 4. No evidence of distant disease. 5. Staging by FDG PET-CT T2a N2 M0 for  non-small  cell lung cancer.   Electronically Signed   By: Suzy Bouchard M.D.   On: 12/08/2013 15:19   Dg C-arm Bronchoscopy  12/12/2013   CLINICAL DATA: bronch   C-ARM BRONCHOSCOPY  Fluoroscopy was utilized by the requesting physician.  No radiographic  interpretation.       IMPRESSION: This patient is a very nice 63 year old gentleman with stage IIIa adenocarcinoma right upper lobe lung. He represents a good candidate for concurrent  chemoradiotherapy.  PLAN:Today, I talked to the patient and family about the findings and work-up thus far.  We discussed the natural history of disease and general treatment, highlighting the role or radiotherapy in the management.  We discussed the available radiation techniques, and focused on the details of logistics and delivery.  We reviewed the anticipated acute and late sequelae associated with radiation in this setting.  The patient was encouraged to ask questions that I answered to the best of my ability.  I filled out a patient counseling form during our discussion including treatment diagrams.  We retained a copy for our records.  The patient would like to proceed with radiation and will be scheduled for CT simulation Monday, May 11 at 8 AM. He is likely to begin concurrent chemoradiotherapy on Monday, May 18.  I spent more than 50% of our time in counseling and/or coordination of care.    ------------------------------------------------  Sheral Apley. Tammi Klippel, M.D.

## 2013-12-14 NOTE — Progress Notes (Signed)
[place] Clinical Social Work  Clinical Social Work met with patient/family and Futures trader at [facility] appointment to offer support and assess for psychosocial needs.  Medical oncologist reviewed patient's diagnosis and recommended treatment plan with patient/family.  The patient was accompanied by his spouse (of 98 years), son Thermon Zulauf, daughter Lattie Haw, brother, and sister.  The patient reports being well supported by family.  He is currently retired and lives in [place].  The patient has no concerns or questions at this time, he states he is ready to begin treatment.  Clinical Social Work briefly discussed Clinical Social Work role and [employer] support programs/services.  Clinical Social Work encouraged patient to call with any additional questions or concerns.   Polo Riley, MSW, LCSW, OSW-C Clinical Social Worker [facility] [phone]

## 2013-12-14 NOTE — Patient Instructions (Signed)
Smoking Cessation, Tips for Success If you are ready to quit smoking, congratulations! You have chosen to help yourself be healthier. Cigarettes bring nicotine, tar, carbon monoxide, and other irritants into your body. Your lungs, heart, and blood vessels will be able to work better without these poisons. There are many different ways to quit smoking. Nicotine gum, nicotine patches, a nicotine inhaler, or nicotine nasal spray can help with physical craving. Hypnosis, support groups, and medicines help break the habit of smoking. WHAT THINGS CAN I DO TO MAKE QUITTING EASIER?  Here are some tips to help you quit for good:  Pick a date when you will quit smoking completely. Tell all of your friends and family about your plan to quit on that date.  Do not try to slowly cut down on the number of cigarettes you are smoking. Pick a quit date and quit smoking completely starting on that day.  Throw away all cigarettes.   Clean and remove all ashtrays from your home, work, and car.   On a card, write down your reasons for quitting. Carry the card with you and read it when you get the urge to smoke.   Cleanse your body of nicotine. Drink enough water and fluids to keep your urine clear or pale yellow. Do this after quitting to flush the nicotine from your body.   Learn to predict your moods. Do not let a bad situation be your excuse to have a cigarette. Some situations in your life might tempt you into wanting a cigarette.   Never have "just one" cigarette. It leads to wanting another and another. Remind yourself of your decision to quit.   Change habits associated with smoking. If you smoked while driving or when feeling stressed, try other activities to replace smoking. Stand up when drinking your coffee. Brush your teeth after eating. Sit in a different chair when you read the paper. Avoid alcohol while trying to quit, and try to drink fewer caffeinated beverages. Alcohol and caffeine may urge  you to smoke.   Avoid foods and drinks that can trigger a desire to smoke, such as sugary or spicy foods and alcohol.   Ask people who smoke not to smoke around you.   Have something planned to do right after eating or having a cup of coffee. For example, plan to take a walk or exercise.   Try a relaxation exercise to calm you down and decrease your stress. Remember, you may be tense and nervous for the first 2 weeks after you quit, but this will pass.   Find new activities to keep your hands busy. Play with a pen, coin, or rubber band. Doodle or draw things on paper.   Brush your teeth right after eating. This will help cut down on the craving for the taste of tobacco after meals. You can also try mouthwash.   Use oral substitutes in place of cigarettes. Try using lemon drops, carrots, cinnamon sticks, or chewing gum. Keep them handy so they are available when you have the urge to smoke.   When you have the urge to smoke, try deep breathing.   Designate your home as a nonsmoking area.   If you are a heavy smoker, ask your health care provider about a prescription for nicotine chewing gum. It can ease your withdrawal from nicotine.   Reward yourself. Set aside the cigarette money you save and buy yourself something nice.   Look for support from others. Join a support group or   smoking cessation program. Ask someone at home or at work to help you with your plan to quit smoking.   Always ask yourself, "Do I need this cigarette or is this just a reflex?" Tell yourself, "Today, I choose not to smoke," or "I do not want to smoke." You are reminding yourself of your decision to quit.  Do not replace cigarette smoking with electronic cigarettes (commonly called e-cigarettes). The safety of e-cigarettes is unknown, and some may contain harmful chemicals.  If you relapse, do not give up! Plan ahead and think about what you will do the next time you get the urge to smoke.  HOW WILL  I FEEL WHEN I QUIT SMOKING? You may have symptoms of withdrawal because your body is used to nicotine (the addictive substance in cigarettes). You may crave cigarettes, be irritable, feel very hungry, cough often, get headaches, or have difficulty concentrating. The withdrawal symptoms are only temporary. They are strongest when you first quit but will go away within 10 14 days. When withdrawal symptoms occur, stay in control. Think about your reasons for quitting. Remind yourself that these are signs that your body is healing and getting used to being without cigarettes. Remember that withdrawal symptoms are easier to treat than the major diseases that smoking can cause.  Even after the withdrawal is over, expect periodic urges to smoke. However, these cravings are generally short lived and will go away whether you smoke or not. Do not smoke!  WHAT RESOURCES ARE AVAILABLE TO HELP ME QUIT SMOKING? Your health care provider can direct you to community resources or hospitals for support, which may include:  Group support.  Education.  Hypnosis.  Therapy. Document Released: 04/24/2004 Document Revised: 05/17/2013 Document Reviewed: 01/12/2013 [employer] Patient Information 2014 [employer]  

## 2013-12-14 NOTE — Progress Notes (Signed)
[place] Telephone:([phone]) [phone]   [phone] Multidisciplinary thoracic oncology clinic ([place])   CONSULT NOTE  REFERRING PHYSICIAN: Dr. Modesto Charon  REASON FOR CONSULTATION:  63 years old white male recently diagnosed with lung cancer.  HPI Carlos Goodwin is a 63 y.o. male was past medical history significant for COPD, history of coronary artery disease status post stent placement in 2004, sleep apnea, dyslipidemia, moderate obesity, GERD as well as long history of smoking. The patient mentioned that few weeks ago he noticed a lump on the right side of his neck. He was seen by his primary care physician Dr. Luan Pulling. Ultrasound of the neck was performed 11/27/2013 and showed skin thickening and thickening of the subcutaneous tissue at the site of palpable concern at the submental region. This was followed by CT scan of the neck on 11/29/2013 and it showed confluent mass like opacity in the right lung apex extending to the apical pleura and inseparable from the margins of the right mediastinum. It measured 4.0 x 3.9 x 4.4 CM with associated mediastinal lymphadenopathy suspicious for bronchogenic carcinoma with nodal involvement. There was increased number of subcentimeter nodes at the thoracic inlet. These findings were unrelated to the palpable area of concern which most resembles a submental cellulitis was mildly active level I lymph nodes. The patient was treated with a course of antibiotics with improvement in his cellulitis. CT scan of the chest without contrast was performed on 12/06/2013 and it showed 3.9 x 3.8 x 4.6 CM mass in the medial right lung apex suspicious for primary bronchogenic neoplasm. Those additional 0.6 x 1.6 CM nodule in the central right upper lobe. There was multiple thoracic lymphadenopathy including 0.8 CM short axis right supraclavicular node that was indeterminate, right paratracheal node measuring up to 1.6 CM, 2.5 CM short axis right  hilar node and 1.1 CM short axis subcarinal node. The patient was referred to Dr. Roxan Hockey and a PET scan was performed on 12/08/2013. It showed hypermetabolic right apical mass consistent with primary bronchogenic carcinoma. The mass abuts the pleural surface but does not appear to invade the mediastinum or chest wall. There is hypermetabolic right suprahilar lymph nodes: Nodule. There is hypermetabolic ipsilateral and subcarinal metastatic lymph nodes. There was no evidence of distant metastatic disease. MRI of the brain on 12/13/2013 showed no evidence of metastatic disease. On 12/12/2013 the patient underwent video bronchoscopy with endobronchial ultrasound and mediastinal lymph node aspiration as well as electromagnetic navigational bronchoscopy with brushings, biopsies and needle aspiration and washings under the care of Dr. Roxan Hockey. The final cytology (Accession: [id number]) from the fine needle aspiration of the 4R and 7 lymph node showed malignant cells present consistent with adenocarcinoma.  Dr. Roxan Hockey kindly referred the patient to me today for evaluation and recommendation regarding treatment of his condition. When seen today the patient is feeling fine with no specific complaints except for shortness breath with exertion as well as cough productive of clear sputum. The patient denied having any significant weight loss or night sweats. He has no headache or visual changes. He denied having any significant chest pain. He has no nausea or vomiting, no fever or chills. Family history significant for mother with diabetes mellitus, father had lung cancer, brother had lymphoma and grandfather with prostate cancer and melanoma. The patient is married and has 3 children. He was accompanied today by several family members including his wife Carlos Goodwin, a son Carlos Goodwin and his daughter Carlos Goodwin. He was also accompanied by his  brother and sister. The patient is currently retired and used to work in  [employer]. He has a history of smoking one pack per day for around 50 years and unfortunately continues to smoke a few cigarettes every day. I strongly encouraged the patient to quit smoking and offered him smoke cessation program. He has no history of alcohol or drug abuse.  HPI  Past Medical History  Diagnosis Date  . COPD (chronic obstructive pulmonary disease)   . Coronary artery disease   . Sleep apnea   . Hypertension   . High cholesterol   . MI (myocardial infarction) 2004  . GERD (gastroesophageal reflux disease)   . Family history of anesthesia complication     Daughter has nausea and vomiting  . Shortness of breath   . Cancer     lungs x 3 spots    Past Surgical History  Procedure Laterality Date  . Appendectomy    . Coronary angioplasty  2004    1 stent  . Anterior cruciate ligament repair Right   . Tonsillectomy    . Prostate biopsy N/A 06/13/2013    Procedure: ULTRASOUND GUIDED PROSTATE BIOPSY;  Surgeon: Marissa Nestle, MD;  Location: [facility];  Service: Urology;  Laterality: N/A;    Family History  Problem Relation Age of Onset  . Diabetes Mother   . Cancer Father     Social History History  Substance Use Topics  . Smoking status: Current Every Day Smoker -- 1.00 packs/day for 50 years    Types: Cigarettes  . Smokeless tobacco: Never Used     Comment: Pt wants to quit smoking on his own; wants no materials  . Alcohol Use: No     Comment: social alcohol    Allergies  Allergen Reactions  . Plavix [Clopidogrel Bisulfate]     Hives and rash  . Other Rash    States that in 2004 he was placed on a blood thinner that made him break out in a rash.?plavix     Current Outpatient Prescriptions  Medication Sig Dispense Refill  . albuterol (PROVENTIL HFA;VENTOLIN HFA) 108 (90 BASE) MCG/ACT inhaler Inhale 1-2 puffs into the lungs every 6 (six) hours as needed for wheezing or shortness of breath.      Marland Kitchen amoxicillin (AMOXIL) 500 MG capsule Take 500 mg by  mouth.      Marland Kitchen aspirin EC 81 MG tablet Take 81 mg by mouth at bedtime.      . metoprolol (LOPRESSOR) 100 MG tablet Take 100 mg by mouth at bedtime.      . ranitidine (ZANTAC) 150 MG tablet Take 150 mg by mouth daily.      . simvastatin (ZOCOR) 40 MG tablet Take 40 mg by mouth every evening.      . tiotropium (SPIRIVA) 18 MCG inhalation capsule Place 18 mcg into inhaler and inhale daily.      . prochlorperazine (COMPAZINE) 10 MG tablet Take 1 tablet (10 mg total) by mouth every 6 (six) hours as needed for nausea or vomiting.  60 tablet  0   No current facility-administered medications for this visit.    Review of Systems  Constitutional: negative Eyes: negative Ears, nose, mouth, throat, and face: negative Respiratory: positive for cough, dyspnea on exertion and sputum Cardiovascular: negative Gastrointestinal: negative Genitourinary:negative Integument/breast: negative Hematologic/lymphatic: negative Musculoskeletal:negative Neurological: negative Behavioral/Psych: negative Endocrine: negative Allergic/Immunologic: negative  Physical Exam  AYT:KZSWF, healthy, no distress, well nourished and well developed SKIN: skin color, texture, turgor are normal,  no rashes or significant lesions HEAD: Normocephalic, No masses, lesions, tenderness or abnormalities EYES: normal, PERRLA EARS: External ears normal, Canals clear OROPHARYNX:no exudate, no erythema and lips, buccal mucosa, and tongue normal  NECK: supple, no adenopathy, no JVD LYMPH:  no palpable lymphadenopathy, no hepatosplenomegaly LUNGS: clear to auscultation , and palpation HEART: regular rate & rhythm and no murmurs ABDOMEN:abdomen soft, non-tender, obese, normal bowel sounds and no masses or organomegaly BACK: Back symmetric, no curvature., No CVA tenderness EXTREMITIES:no joint deformities, effusion, or inflammation, no edema, no skin discoloration, no clubbing  NEURO: alert & oriented x 3 with fluent speech, no focal  motor/sensory deficits  PERFORMANCE STATUS: ECOG 1  LABORATORY DATA: Lab Results  Component Value Date   WBC 9.7 12/08/2013   HGB 18.0* 12/12/2013   HCT 53.0* 12/12/2013   MCV 88.3 12/08/2013   PLT 256 12/08/2013      Chemistry      Component Value Date/Time   NA 141 12/12/2013 0626   K 4.6 12/12/2013 0626   CL 101 12/08/2013 1029   CO2 30 12/08/2013 1029   BUN 15 12/08/2013 1029   CREATININE 0.84 12/08/2013 1029      Component Value Date/Time   CALCIUM 9.4 12/08/2013 1029       RADIOGRAPHIC STUDIES: Dg Chest 2 View  12/08/2013   CLINICAL DATA:  Lung mass.  EXAM: CHEST  2 VIEW  COMPARISON:  Chest x-rays dated 03/07/2013 and 07/03/2009 and chest CT dated 12/06/2013  FINDINGS: There is an ill-defined mass at the right lung apex medially. There is slight fullness on the right hilum. There are multiple pleural calcifications bilaterally. There is chronic pleural thickening at the left lung base. Heart size and pulmonary vascularity are normal.  No effusions.  No acute osseous abnormality.  IMPRESSION: Subtle poorly defined mass in the right lung apex. Slight fullness of the right hilum consistent with adenopathy demonstrated on the CT scan.  Stable chronic pleural changes.   Electronically Signed   By: Rozetta Nunnery M.D.   On: 12/08/2013 11:14   Ct Soft Tissue Neck W Contrast  11/29/2013   ADDENDUM REPORT: 11/29/2013 09:59  ADDENDUM: Study discussed by telephone with EDWARD HAWKINS on 11/29/2013 at 09:50 .   Electronically Signed   By: Lars Pinks M.D.   On: 11/29/2013 09:59   11/29/2013   CLINICAL DATA:  63 year old male with redness and swelling of the neck since Sunday. Initial encounter.  EXAM: CT NECK WITH CONTRAST  TECHNIQUE: Multidetector CT imaging of the neck was performed using the standard protocol following the bolus administration of intravenous contrast.  CONTRAST:  8mL OMNIPAQUE IOHEXOL 300 MG/ML  SOLN  COMPARISON:  Neck ultrasound 11/27/2013.  FINDINGS: Confluent masslike opacity in the right  lung apex extending to the apical pleura and inseparable from the margin of the right mediastinum. This encompasses 40 x 39 x 44 mm (AP by transverse by CC). There is associated mediastinal lymphadenopathy, with enlarged individual nodes 17 mm short axis. No upper thoracic osseous involvement identified.  Negative thyroid, larynx, pharynx, parapharyngeal spaces, retropharyngeal space (retropharyngeal course of the left carotid noted), submandibular glands and parotid glands. Negative visualized posterior fossa brain parenchyma. Suboptimal intravascular contrast timing, grossly patent major vascular structures in the neck.  There is a conspicuous increased number of subcentimeter lymph nodes at the bilateral level 4 nodal stations. There is also at 10 mm asymmetric left level IIIb node (series 2, image 56).  The palpable area of concern is in the  submental region where there is subcutaneous stranding and mild enlargement of level 1A nodes (8-9 mm short axis) plus mild thickening of the platysma. No fluid collection. There is overlying skin thickening. The submandibular glands are nearby but appear normal. No silolithiasis identified.  No other soft tissue inflammation in the neck.  Bubbly opacity in the right maxillary sinus. Other Visualized paranasal sinuses and mastoids are clear. No acute or suspicious osseous abnormality in the neck.  IMPRESSION: 1. Right apical lung mass. Partially visible mediastinal lymphadenopathy. Appearance most compatible with bronchogenic carcinoma with nodal involvement. 2. Increased number of subcentimeter nodes at the thoracic inlet are indeterminate, along with a 10 mm left level 3 node. 3. The above findings superior unrelated to the palpable area of concern which most resembles a submental cellulitis with mild reactive level 1 lymph nodes. I called the office of Dr. Sinda Du on 11/28/2013 at 20:20 hours and left a message. I will also attempt to contact Dr. Luan Pulling regarding  the above on the morning of November 29, 2013.  Electronically Signed: By: Lars Pinks M.D. On: 11/28/2013 20:32   Ct Chest Wo Contrast  12/06/2013   CLINICAL DATA:  Follow-up lung nodules on CT neck  EXAM: CT CHEST WITHOUT CONTRAST  TECHNIQUE: Multidetector CT imaging of the chest was performed following the standard protocol without IV contrast.  COMPARISON:  Partial comparison to CT neck dated 11/18/2013  FINDINGS: 3.9 x 3.8 x 4.6 cm mass in the medial right lung apex (series 5/image 10), suspicious for primary bronchogenic neoplasm.  No definite invasion of the chest wall or adjacent right 1st/2nd ribs.  Additional 2.6 x 1.6 cm nodule in the central right upper lobe (series 5/ image 18).  Calcified pleural plaques along the anterior right upper lobe (series 5/ image 20) and left lung base (series 5/ image 52), suggesting asbestos related pleural disease. Mild fibrosis/parenchymal banding with volume loss in the left lower lobe (series 5/image 51).  No pleural effusion or pneumothorax.  Visualized thyroid is unremarkable.  The heart is normal in size. No pericardial effusion. Coronary atherosclerosis with LAD stent. Atherosclerotic calcifications of the aortic arch.  Thoracic lymphadenopathy, including:  --8 mm short axis right supraclavicular node (series 2/image 3), indeterminate  --Right paratracheal nodes measuring up to 1.6 cm short axis (series 2/ image 20)  --2.5 cm short axis right hilar node (series 2/ image 29)  --1.1 cm short axis subcarinal node (series 2/image 30)  Visualized upper abdomen is unremarkable. Bilateral adrenal glands were not imaged.  Degenerative changes of the visualized thoracolumbar spine.  IMPRESSION: 4.6 cm mass in the medial right lung apex, suspicious for primary bronchogenic neoplasm.  Additional 2.6 cm nodule in the central right upper lobe.  Mediastinal/right hilar lymphadenopathy, suspicious for nodal metastases, as above.  Asbestos related pleural disease.   Electronically  Signed   By: Julian Hy M.D.   On: 12/06/2013 14:48   Mr Jeri Cos ID Contrast  12/13/2013   CLINICAL DATA:  New diagnosis lung cancer.  Weakness.  EXAM: MRI HEAD WITHOUT AND WITH CONTRAST  TECHNIQUE: Multiplanar, multiecho pulse sequences of the brain and surrounding structures were obtained without and with intravenous contrast.  CONTRAST:  75mL MULTIHANCE GADOBENATE DIMEGLUMINE 529 MG/ML IV SOLN  COMPARISON:  None.  FINDINGS: Diffusion imaging does not show any acute or subacute infarction. There are minimal chronic small vessel changes within the pons. No cerebellar insult. The cerebral hemispheres show mild chronic small-vessel changes within the deep and subcortical  white matter. No cortical or large vessel territory abnormality. No evidence of primary or metastatic mass lesion. No pituitary mass. No hydrocephalus or extra-axial collection. There is some mucosal inflammation of the right maxillary sinus with a small fluid level. No skull or skullbase lesion is seen.  IMPRESSION: No evidence of metastatic disease. Mild chronic small-vessel disease as outlined above. No acute finding.  Some inflammatory change of the right maxillary sinus.   Electronically Signed   By: Nelson Chimes M.D.   On: 12/13/2013 13:37   US Soft Tissue Head/neck  11/27/2013   CLINICAL DATA:  Anterior submental neck mass  EXAM: ULTRASOUND OF HEAD/NECK SOFT TISSUES  TECHNIQUE: Ultrasound examination of the head and neck soft tissues was performed in the area of clinical concern.  COMPARISON:  None  FINDINGS: Sonography of the site of palpable concern performed.  Thickening of these skin and subcutaneous tissues is seen at this site as compared to lateral aspects of the neck bilaterally.  Minimal subcutaneous edema at a single site but not throughout remainder of the palpable region.  No mass, abnormal fluid collection/cyst, or enlarged lymph node identified at the site of palpable abnormality.  I am unable to identify at the cause  for the palpable finding at real-time imaging.  IMPRESSION: Skin thickening and thickening of subcutaneous tissues at this site of palpable concern at the submental region.  The palpable submental process is sonographically occult; if process fails to resolve, consider followup CT or MR imaging of the neck with contrast for further evaluation.  Discussed with Dr. Luan Pulling at time of exam.   Electronically Signed   By: Lavonia Dana M.D.   On: 11/27/2013 14:18   Nm Pet Image Initial (pi) Skull Base To Thigh  12/08/2013   CLINICAL DATA:  Initial treatment strategy for lung mass.  EXAM: NUCLEAR MEDICINE PET SKULL BASE TO THIGH  TECHNIQUE: 8.5 mCi F-18 FDG was injected intravenously. Full-ring PET imaging was performed from the skull base to thigh after the radiotracer. CT data was obtained and used for attenuation correction and anatomic localization.  FASTING BLOOD GLUCOSE:  Value: 85 mg/dl  COMPARISON:  CT 12/06/2013  FINDINGS: NECK  Theskullbase is inadvertently omitted. The scan begins at the level of the inferior maxillary bone. There are no hypermetabolic nodes in the neck.  CHEST  There is a hypermetabolic right apical mass with SUV max equaled 25.7. This mass measures 3.3 x 3.8 cm and abuts the right upper mediastinum (image 34, series 4.  There is a hypermetabolic of right suprahilar nodule measuring 2.3 mm cm with SUV max 20.1.  There are hypermetabolic right paratracheal and subcarinal lymph nodes. Subcarinal lymph node is small measuring only 12 mm short axis with intensely metabolic with SUV maz equal 17.6. The highest hyper may right paratracheal lymph nodes measures 15 mm (image 43) with SUV max 15.5. No hypermetabolic supraclavicular lymph nodes.  ABDOMEN/PELVIS  No abnormal hypermetabolic activity within the liver, pancreas, adrenal glands, or spleen. No hypermetabolic lymph nodes in the abdomen or pelvis.  SKELETON  No focal hypermetabolic activity to suggest skeletal metastasis.  IMPRESSION: 1.  Hypermetabolic right apical mass consistent with primary bronchogenic carcinoma. Mass abuts the pleural surface but does not appear to invade the mediastinum or chest wall. 2. Hypermetabolic right suprahilar hilar lymph node or nodule. 3. Hypermetabolic ipsilateral and subcarinal metastatic lymph nodes. 4. No evidence of distant disease. 5. Staging by FDG PET-CT T2a N2 M0 for  non-small cell lung cancer.  Electronically Signed   By: Suzy Bouchard M.D.   On: 12/08/2013 15:19   Dg C-arm Bronchoscopy  12/12/2013   CLINICAL DATA: bronch   C-ARM BRONCHOSCOPY  Fluoroscopy was utilized by the requesting physician.  No radiographic  interpretation.     ASSESSMENT: This is a very pleasant 63 years old white male recently diagnosed with a stage IIIA (T2a, N2, M0) non-small cell lung cancer consistent with adenocarcinoma diagnosed in May of 2014, presented with right upper lobe lung mass in addition to mediastinal lymphadenopathy.   PLAN: I had a lengthy discussion with the patient and his family today about his current disease stage, prognosis and treatment options. I recommended for the patient treatment with a course of concurrent chemoradiation with weekly carboplatin for AUC of 2 and paclitaxel 45 mg/M2. I discussed with the patient adverse effect of this treatment including but not limited to alopecia, myelosuppression, nausea and vomiting, peripheral neuropathy, liver or renal dysfunction. After completion of the induction phase of concurrent chemoradiation, the patient may be considered for consolidation chemotherapy. I will also send her tissue to be tested for EGFR mutation as well as ALK gene translocation. I will arrange for the patient to have a chemotherapy education class before starting the first dose of his treatment. He is expected to start the course of concurrent chemoradiation on 12/25/2013 The patient voices understanding of current disease status and treatment options and is in  agreement with the current care plan. I will Escribe Compazine 10 mg by mouth every 6 hours as needed for nausea. The patient will see Dr. Tammi Klippel later today for evaluation and discussion of his radiotherapy option. He would come back for followup visit in 3 weeks for evaluation and management any adverse effect of his chemotherapy. I gave the patient and his family the time to ask questions and I answered them completely to their satisfactions. He was advised to call immediately if he has any concerning symptoms in the interval. The patient was seen today during the multidisciplinary clinic visit by medical oncology, radiation oncology, thoracic navigator, social worker as well as physical therapist. All questions were answered. The patient knows to call the clinic with any problems, questions or concerns. We can certainly see the patient much sooner if necessary.  Thank you so much for allowing me to participate in the care of NICKALOUS STINGLEY. I will continue to follow up the patient with you and assist in his care.  I spent 55 minutes counseling the patient face to face. The total time spent in the appointment was 80 minutes.  Disclaimer: This note was dictated with voice recognition software. Similar sounding words can inadvertently be transcribed and may not be corrected upon review.   Curt Bears 12/14/2013, 3:59 PM

## 2013-12-14 NOTE — Progress Notes (Signed)
   Thoracic Treatment Summary Name:Carlos Goodwin Date:12/14/2013 DOB:04/17/1951 Your Medical Team Medical Oncologist: Dr. Julien Nordmann  Radiation Oncologist: Dr. Tammi Klippel  Pulmonologist: Dr. Luan Pulling Surgeon: Dr. Roxan Hockey  Type and Stage of Lung Cancer Non-Small Cell Carcinoma: Adenocarcinoma Clinical Stage: III  Clinical stage is based on radiology exams.  Pathological stage will be determined after surgery.  Staging is based on the size of the tumor, involvement of lymph nodes or not, and whether or not the cancer center has spread. Recommendations Recommendations: Concurrent chemoradiation therapy  These recommendations are based on information available as of today's consult.  This is subject to change depending further testing or exams. Next Steps Next Step: 1. Radiation Oncology [facility] appointment 12/18/13 at 7:45 2. Medical Oncology schedulers will set up appointment for chemo class and chemotherapy Barriers to Care What do you perceive as a potential barrier that may prevent you from receiving your treatment plan? Nothing perceived at this time Resources given NCI Booklet lung cancer  Smoking cessation information  Resource/support services at [facility] [phone] www.cancer.org Questions [name], RN BSN Thoracic Oncology Nurse Navigator at [place] is a nurse navigator that is available to assist you through your cancer journey.  She can answer your questions and/or provide resources regarding your treatment plan, emotional support, or financial concerns.

## 2013-12-15 ENCOUNTER — Encounter (HOSPITAL_COMMUNITY): Payer: Self-pay | Admitting: Thoracic Surgery (Cardiothoracic Vascular Surgery)

## 2013-12-15 ENCOUNTER — Telehealth: Payer: Self-pay | Admitting: *Deleted

## 2013-12-15 NOTE — Telephone Encounter (Signed)
Per staff message and POF I have scheduled appts.  JMW  

## 2013-12-17 ENCOUNTER — Encounter: Payer: Self-pay | Admitting: Radiation Oncology

## 2013-12-17 NOTE — Progress Notes (Signed)
  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  Identity was confirmed.  All relevant records and images related to the planned course of therapy were reviewed.  The patient freely provided informed written consent to proceed with treatment after reviewing the details related to the planned course of therapy. The consent form was witnessed and verified by the simulation staff.  Then, the patient was set-up in a stable reproducible  supine position for radiation therapy.  CT images were obtained.  Surface markings were placed.  The CT images were loaded into the planning software.  Then the target and avoidance structures were contoured.  Treatment planning then occurred.  The radiation prescription was entered and confirmed.  Then, I designed and supervised the construction of a total of 5 medically necessary complex treatment devices as MLC apertures to cover the target and shield critical structures.  I have requested : 3D Simulation  I have requested a DVH of the following structures: Target, Left Lung, Right Lung, spinal cord, esophagus, and heart.  This treatment constitutes a Special Treatment Procedure for the following reason: [ Concurrent chemotherapy requiring careful monitoring for increased toxicities of treatment including weekly laboratory values. I have ordered:Nutrition Consult and CBC  PLAN:  The patient will receive 66 Gy in 33 fractions.  ________________________________  Sheral Apley Tammi Klippel, M.D.

## 2013-12-18 ENCOUNTER — Ambulatory Visit
Admission: RE | Admit: 2013-12-18 | Discharge: 2013-12-18 | Disposition: A | Discharge status: Home / Self Care | Payer: Medicare Other | Source: Ambulatory Visit | Attending: Radiation Oncology | Admitting: Radiation Oncology

## 2013-12-18 VITALS — BP 140/69 | HR 66 | Resp 18 | Ht 70.0 in | Wt 274.9 lb

## 2013-12-18 DIAGNOSIS — C3410 Malignant neoplasm of upper lobe, unspecified bronchus or lung: Secondary | ICD-10-CM | POA: Insufficient documentation

## 2013-12-18 DIAGNOSIS — Z510 Encounter for antineoplastic radiation therapy: Secondary | ICD-10-CM | POA: Diagnosis present

## 2013-12-18 NOTE — Progress Notes (Addendum)
Patient denies that he has a pacemaker but, does report he has a stent. Denies history of radiation therapy. Patient reports he was given an Advanced Directive packet in the [facility] clinic. He states, "we are working on completing it now."

## 2013-12-19 ENCOUNTER — Other Ambulatory Visit: Payer: Medicare Other

## 2013-12-19 ENCOUNTER — Encounter: Payer: Self-pay | Admitting: *Deleted

## 2013-12-20 ENCOUNTER — Encounter: Payer: Self-pay | Admitting: *Deleted

## 2013-12-20 DIAGNOSIS — Z510 Encounter for antineoplastic radiation therapy: Secondary | ICD-10-CM | POA: Diagnosis not present

## 2013-12-21 DIAGNOSIS — Z510 Encounter for antineoplastic radiation therapy: Secondary | ICD-10-CM | POA: Diagnosis not present

## 2013-12-22 ENCOUNTER — Telehealth: Payer: Self-pay | Admitting: Internal Medicine

## 2013-12-22 NOTE — Telephone Encounter (Signed)
called pt left message regarding appt on Monday, advised pt to get appt calendar

## 2013-12-25 ENCOUNTER — Ambulatory Visit
Admission: RE | Admit: 2013-12-25 | Discharge: 2013-12-25 | Disposition: A | Discharge status: Home / Self Care | Payer: Medicare Other | Source: Ambulatory Visit | Attending: Radiation Oncology | Admitting: Radiation Oncology

## 2013-12-25 ENCOUNTER — Other Ambulatory Visit (HOSPITAL_BASED_OUTPATIENT_CLINIC_OR_DEPARTMENT_OTHER): Payer: Medicare Other

## 2013-12-25 ENCOUNTER — Ambulatory Visit (HOSPITAL_BASED_OUTPATIENT_CLINIC_OR_DEPARTMENT_OTHER): Payer: Medicare Other

## 2013-12-25 VITALS — BP 170/80 | HR 73 | Temp 98.7°F | Resp 20

## 2013-12-25 DIAGNOSIS — C781 Secondary malignant neoplasm of mediastinum: Secondary | ICD-10-CM

## 2013-12-25 DIAGNOSIS — C3491 Malignant neoplasm of unspecified part of right bronchus or lung: Secondary | ICD-10-CM

## 2013-12-25 DIAGNOSIS — C3410 Malignant neoplasm of upper lobe, unspecified bronchus or lung: Secondary | ICD-10-CM

## 2013-12-25 DIAGNOSIS — Z510 Encounter for antineoplastic radiation therapy: Secondary | ICD-10-CM | POA: Diagnosis not present

## 2013-12-25 DIAGNOSIS — Z5111 Encounter for antineoplastic chemotherapy: Secondary | ICD-10-CM

## 2013-12-25 LAB — CBC WITH DIFFERENTIAL/PLATELET
BASO%: 0.9 % (ref 0.0–2.0)
BASOS ABS: 0.1 10*3/uL (ref 0.0–0.1)
EOS%: 1.9 % (ref 0.0–7.0)
Eosinophils Absolute: 0.2 10*3/uL (ref 0.0–0.5)
HCT: 49.9 % (ref 38.4–49.9)
HEMOGLOBIN: 16.1 g/dL (ref 13.0–17.1)
LYMPH%: 15.5 % (ref 14.0–49.0)
MCH: 28 pg (ref 27.2–33.4)
MCHC: 32.2 g/dL (ref 32.0–36.0)
MCV: 86.8 fL (ref 79.3–98.0)
MONO#: 0.6 10*3/uL (ref 0.1–0.9)
MONO%: 5.3 % (ref 0.0–14.0)
NEUT#: 8.3 10*3/uL — ABNORMAL HIGH (ref 1.5–6.5)
NEUT%: 76.4 % — AB (ref 39.0–75.0)
PLATELETS: 271 10*3/uL (ref 140–400)
RBC: 5.75 10*6/uL (ref 4.20–5.82)
RDW: 14 % (ref 11.0–14.6)
WBC: 10.9 10*3/uL — ABNORMAL HIGH (ref 4.0–10.3)
lymph#: 1.7 10*3/uL (ref 0.9–3.3)

## 2013-12-25 LAB — COMPREHENSIVE METABOLIC PANEL (CC13)
ALBUMIN: 3 g/dL — AB (ref 3.5–5.0)
ALK PHOS: 76 U/L (ref 40–150)
ALT: 19 U/L (ref 0–55)
AST: 17 U/L (ref 5–34)
Anion Gap: 13 mEq/L — ABNORMAL HIGH (ref 3–11)
BUN: 12.9 mg/dL (ref 7.0–26.0)
CO2: 29 mEq/L (ref 22–29)
CREATININE: 0.8 mg/dL (ref 0.7–1.3)
Calcium: 9.5 mg/dL (ref 8.4–10.4)
Chloride: 104 mEq/L (ref 98–109)
GLUCOSE: 133 mg/dL (ref 70–140)
Potassium: 4.3 mEq/L (ref 3.5–5.1)
Sodium: 146 mEq/L — ABNORMAL HIGH (ref 136–145)
Total Bilirubin: 0.27 mg/dL (ref 0.20–1.20)
Total Protein: 7.1 g/dL (ref 6.4–8.3)

## 2013-12-25 MED ORDER — SODIUM CHLORIDE 0.9 % IV SOLN
300.0000 mg | Freq: Once | INTRAVENOUS | Status: AC
  Administered 2013-12-25: 300 mg via INTRAVENOUS
  Filled 2013-12-25: qty 30

## 2013-12-25 MED ORDER — FAMOTIDINE IN NACL 20-0.9 MG/50ML-% IV SOLN
20.0000 mg | Freq: Once | INTRAVENOUS | Status: AC
  Administered 2013-12-25: 20 mg via INTRAVENOUS

## 2013-12-25 MED ORDER — DEXAMETHASONE SODIUM PHOSPHATE 20 MG/5ML IJ SOLN
INTRAMUSCULAR | Status: AC
  Filled 2013-12-25: qty 5

## 2013-12-25 MED ORDER — PACLITAXEL CHEMO INJECTION 300 MG/50ML
45.0000 mg/m2 | Freq: Once | INTRAVENOUS | Status: AC
  Administered 2013-12-25: 114 mg via INTRAVENOUS
  Filled 2013-12-25: qty 19

## 2013-12-25 MED ORDER — DIPHENHYDRAMINE HCL 50 MG/ML IJ SOLN
INTRAMUSCULAR | Status: AC
  Filled 2013-12-25: qty 1

## 2013-12-25 MED ORDER — DEXAMETHASONE SODIUM PHOSPHATE 20 MG/5ML IJ SOLN
20.0000 mg | Freq: Once | INTRAMUSCULAR | Status: AC
  Administered 2013-12-25: 20 mg via INTRAVENOUS

## 2013-12-25 MED ORDER — DIPHENHYDRAMINE HCL 50 MG/ML IJ SOLN
50.0000 mg | Freq: Once | INTRAMUSCULAR | Status: AC
  Administered 2013-12-25: 50 mg via INTRAVENOUS

## 2013-12-25 MED ORDER — FAMOTIDINE IN NACL 20-0.9 MG/50ML-% IV SOLN
INTRAVENOUS | Status: AC
  Filled 2013-12-25: qty 50

## 2013-12-25 MED ORDER — ONDANSETRON 16 MG/50ML IVPB (CHCC)
INTRAVENOUS | Status: AC
  Filled 2013-12-25: qty 16

## 2013-12-25 MED ORDER — SODIUM CHLORIDE 0.9 % IV SOLN
Freq: Once | INTRAVENOUS | Status: AC
  Administered 2013-12-25: 11:00:00 via INTRAVENOUS

## 2013-12-25 MED ORDER — ONDANSETRON 16 MG/50ML IVPB (CHCC)
16.0000 mg | Freq: Once | INTRAVENOUS | Status: AC
  Administered 2013-12-25: 16 mg via INTRAVENOUS

## 2013-12-25 NOTE — Patient Instructions (Signed)
[facility] Discharge Instructions for Patients Receiving Chemotherapy  Today you received the following chemotherapy agents taxol/carboplatin  To help prevent nausea and vomiting after your treatment, we encourage you to take your nausea medication as directed   If you develop nausea and vomiting that is not controlled by your nausea medication, call the clinic.   BELOW ARE SYMPTOMS THAT SHOULD BE REPORTED IMMEDIATELY:  *FEVER GREATER THAN 100.5 F  *CHILLS WITH OR WITHOUT FEVER  NAUSEA AND VOMITING THAT IS NOT CONTROLLED WITH YOUR NAUSEA MEDICATION  *UNUSUAL SHORTNESS OF BREATH  *UNUSUAL BRUISING OR BLEEDING  TENDERNESS IN MOUTH AND THROAT WITH OR WITHOUT PRESENCE OF ULCERS  *URINARY PROBLEMS  *BOWEL PROBLEMS  UNUSUAL RASH Items with * indicate a potential emergency and should be followed up as soon as possible.  Feel free to call the clinic you have any questions or concerns. The clinic phone number is [phone].  

## 2013-12-25 NOTE — Progress Notes (Signed)
  Radiation Oncology         [phone] ________________________________  Name: Carlos Goodwin MRN: [id number]  Date: 12/25/2013  DOB: 29-Jan-1951  Simulation Verification Note   NARRATIVE: The patient was brought to the treatment unit and placed in the planned treatment position. The clinical setup was verified. Then port films were obtained and uploaded to the radiation oncology medical record software.  The treatment beams were carefully compared against the planned radiation fields. The position, location, and shape of the radiation fields was reviewed. The targeted volume of tissue appears to be appropriately covered by the radiation beams. Based on my personal review, I approved the simulation verification. The patient's treatment will proceed as planned.  ________________________________   Jodelle Gross, MD, PhD

## 2013-12-26 ENCOUNTER — Encounter: Payer: Self-pay | Admitting: *Deleted

## 2013-12-26 ENCOUNTER — Encounter (HOSPITAL_COMMUNITY): Payer: Self-pay

## 2013-12-26 ENCOUNTER — Telehealth: Payer: Self-pay | Admitting: *Deleted

## 2013-12-26 ENCOUNTER — Ambulatory Visit
Admission: RE | Admit: 2013-12-26 | Discharge: 2013-12-26 | Disposition: A | Discharge status: Home / Self Care | Payer: Medicare Other | Source: Ambulatory Visit | Attending: Radiation Oncology | Admitting: Radiation Oncology

## 2013-12-26 DIAGNOSIS — Z510 Encounter for antineoplastic radiation therapy: Secondary | ICD-10-CM | POA: Diagnosis not present

## 2013-12-26 NOTE — Telephone Encounter (Signed)
[facility] for chemotherapy F/U.  Patient is doing well.  Denies n/v.  Denies any new side effects or symptoms.  Bowel and bladder is functioning well.  Eating and drinking well and I instructed to drink 64 oz minimum daily or at least the day before, of and after treatment.  Denies questions at this time and encouraged to call if needed.  Reviewed how to call after hours in the case of an emergency.

## 2013-12-26 NOTE — Telephone Encounter (Signed)
Message copied by Cherylynn Ridges on Tue Dec 26, 2013  5:57 PM ------      Message from: Arty Baumgartner      Created: Mon Dec 25, 2013 11:06 AM      Regarding: first time chemo follow up       First time Taxol/Carbo.  Dr Julien Nordmann.  [phone] ------

## 2013-12-26 NOTE — [facility] Oncology Navigator Note (Unsigned)
Spoke with pt at [facility].  He stated 1st chemo yesterday went well.  Pt is confused about schedule and needs to change some appt.  I instructed pt to speak to schedulers before leaving cancer center today.  He stated he would.

## 2013-12-27 ENCOUNTER — Ambulatory Visit
Admission: RE | Admit: 2013-12-27 | Discharge: 2013-12-27 | Disposition: A | Discharge status: Home / Self Care | Payer: Medicare Other | Source: Ambulatory Visit | Attending: Radiation Oncology | Admitting: Radiation Oncology

## 2013-12-27 DIAGNOSIS — Z510 Encounter for antineoplastic radiation therapy: Secondary | ICD-10-CM | POA: Diagnosis not present

## 2013-12-28 ENCOUNTER — Ambulatory Visit
Admission: RE | Admit: 2013-12-28 | Discharge: 2013-12-28 | Disposition: A | Discharge status: Home / Self Care | Payer: Medicare Other | Source: Ambulatory Visit | Attending: Radiation Oncology | Admitting: Radiation Oncology

## 2013-12-28 DIAGNOSIS — Z510 Encounter for antineoplastic radiation therapy: Secondary | ICD-10-CM | POA: Diagnosis not present

## 2013-12-29 ENCOUNTER — Ambulatory Visit
Admission: RE | Admit: 2013-12-29 | Discharge: 2013-12-29 | Disposition: A | Discharge status: Home / Self Care | Payer: Medicare Other | Source: Ambulatory Visit | Attending: Radiation Oncology | Admitting: Radiation Oncology

## 2013-12-29 ENCOUNTER — Encounter: Payer: Self-pay | Admitting: Radiation Oncology

## 2013-12-29 VITALS — BP 143/97 | HR 75 | Resp 16

## 2013-12-29 DIAGNOSIS — C3410 Malignant neoplasm of upper lobe, unspecified bronchus or lung: Secondary | ICD-10-CM

## 2013-12-29 DIAGNOSIS — Z510 Encounter for antineoplastic radiation therapy: Secondary | ICD-10-CM | POA: Diagnosis not present

## 2013-12-29 NOTE — Progress Notes (Addendum)
Patient denies fatigue or pain. Reports he faired well with initial chemo treatment. Denies skin changes within treatment field. Reports a productive cough with clear sputum. Denies difficulty swallowing. Reports shortness of breath with exertion. Denies headache, dizziness, nausea, vomiting, or diarrhea. Headed to [employer] for [employer] day weekend.   Oriented patient to staff and routine of the clinic. Provided patient with RADIATION THERAPY AND YOU handbook then, reviewed pertinent information. Educated patient reference potential side effects and management such as, throat and skin changes. Provided patient with radiaplex gel. Allowed patient the opportunity to ask questions. Patient verbalized understanding of all reviewed.

## 2013-12-29 NOTE — Progress Notes (Signed)
  Radiation Oncology         [phone] ________________________________  Name: Carlos Goodwin MRN: [id number]  Date: 12/29/2013  DOB: [birth year]  Weekly Radiation Therapy Management  Current Dose: 10 Gy     Planned Dose:  66 Gy  Narrative . . . . . . . . The patient presents for routine under treatment assessment.                                    Patient denies fatigue or pain. Reports he faired well with initial chemo treatment. Denies skin changes within treatment field. Reports a productive cough with clear sputum. Denies difficulty swallowing. Reports shortness of breath with exertion. Denies headache, dizziness, nausea, vomiting, or diarrhea. [place] [employer] [place]                             Set-up films were reviewed.                                 The chart was checked. Physical Findings. . .  blood pressure is 143/97 and his pulse is 75. His respiration is 16 and oxygen saturation is 92%. . Weight essentially stable.  No significant changes. Impression . . . . . . . The patient is tolerating radiation. Plan . . . . . . . . . . . . Continue treatment as planned.  ________________________________  Sheral Apley. Tammi Klippel, M.D.

## 2014-01-02 ENCOUNTER — Ambulatory Visit
Admission: RE | Admit: 2014-01-02 | Discharge: 2014-01-02 | Disposition: A | Discharge status: Home / Self Care | Payer: Medicare Other | Source: Ambulatory Visit | Attending: Radiation Oncology | Admitting: Radiation Oncology

## 2014-01-02 ENCOUNTER — Ambulatory Visit (HOSPITAL_BASED_OUTPATIENT_CLINIC_OR_DEPARTMENT_OTHER): Payer: Medicare Other | Admitting: Internal Medicine

## 2014-01-02 ENCOUNTER — Other Ambulatory Visit (HOSPITAL_BASED_OUTPATIENT_CLINIC_OR_DEPARTMENT_OTHER): Payer: Medicare Other

## 2014-01-02 ENCOUNTER — Other Ambulatory Visit: Payer: Self-pay | Admitting: *Deleted

## 2014-01-02 ENCOUNTER — Telehealth: Payer: Self-pay | Admitting: Internal Medicine

## 2014-01-02 ENCOUNTER — Encounter: Payer: Self-pay | Admitting: Internal Medicine

## 2014-01-02 VITALS — BP 131/66 | HR 79 | Temp 98.7°F | Resp 18 | Ht 70.0 in | Wt 268.6 lb

## 2014-01-02 DIAGNOSIS — C781 Secondary malignant neoplasm of mediastinum: Secondary | ICD-10-CM

## 2014-01-02 DIAGNOSIS — Z510 Encounter for antineoplastic radiation therapy: Secondary | ICD-10-CM | POA: Diagnosis not present

## 2014-01-02 DIAGNOSIS — C3491 Malignant neoplasm of unspecified part of right bronchus or lung: Secondary | ICD-10-CM

## 2014-01-02 DIAGNOSIS — C3410 Malignant neoplasm of upper lobe, unspecified bronchus or lung: Secondary | ICD-10-CM

## 2014-01-02 LAB — COMPREHENSIVE METABOLIC PANEL (CC13)
ALBUMIN: 2.8 g/dL — AB (ref 3.5–5.0)
ALT: 15 U/L (ref 0–55)
ANION GAP: 9 meq/L (ref 3–11)
AST: 11 U/L (ref 5–34)
Alkaline Phosphatase: 65 U/L (ref 40–150)
BUN: 17.3 mg/dL (ref 7.0–26.0)
CO2: 30 meq/L — AB (ref 22–29)
Calcium: 9.5 mg/dL (ref 8.4–10.4)
Chloride: 101 mEq/L (ref 98–109)
Creatinine: 0.9 mg/dL (ref 0.7–1.3)
GLUCOSE: 135 mg/dL (ref 70–140)
Potassium: 4.8 mEq/L (ref 3.5–5.1)
SODIUM: 140 meq/L (ref 136–145)
TOTAL PROTEIN: 6.9 g/dL (ref 6.4–8.3)
Total Bilirubin: 0.41 mg/dL (ref 0.20–1.20)

## 2014-01-02 LAB — CBC WITH DIFFERENTIAL/PLATELET
BASO%: 0.8 % (ref 0.0–2.0)
BASOS ABS: 0.1 10*3/uL (ref 0.0–0.1)
EOS ABS: 0.2 10*3/uL (ref 0.0–0.5)
EOS%: 2 % (ref 0.0–7.0)
HCT: 47.9 % (ref 38.4–49.9)
HEMOGLOBIN: 15.6 g/dL (ref 13.0–17.1)
LYMPH%: 9.7 % — ABNORMAL LOW (ref 14.0–49.0)
MCH: 28.1 pg (ref 27.2–33.4)
MCHC: 32.5 g/dL (ref 32.0–36.0)
MCV: 86.2 fL (ref 79.3–98.0)
MONO#: 0.9 10*3/uL (ref 0.1–0.9)
MONO%: 10.9 % (ref 0.0–14.0)
NEUT%: 76.6 % — ABNORMAL HIGH (ref 39.0–75.0)
NEUTROS ABS: 6.6 10*3/uL — AB (ref 1.5–6.5)
Platelets: 273 10*3/uL (ref 140–400)
RBC: 5.55 10*6/uL (ref 4.20–5.82)
RDW: 14.1 % (ref 11.0–14.6)
WBC: 8.7 10*3/uL (ref 4.0–10.3)
lymph#: 0.8 10*3/uL — ABNORMAL LOW (ref 0.9–3.3)

## 2014-01-02 MED ORDER — RADIAPLEXRX EX GEL
Freq: Once | CUTANEOUS | Status: AC
  Administered 2014-01-02: 11:00:00 via TOPICAL

## 2014-01-02 NOTE — Addendum Note (Signed)
Encounter addended by: Heywood Footman, RN on: 01/02/2014 11:30 AM<BR>     Documentation filed: Inpatient MAR

## 2014-01-02 NOTE — Telephone Encounter (Signed)
gv pt appt schedule for may thru july

## 2014-01-02 NOTE — Progress Notes (Signed)
[facility] [phone]   [phone]) [phone]  OFFICE PROGRESS NOTE  Alonza Bogus, MD [address] [address]  DIAGNOSIS: Stage IIIA (T2a, N2, M0) non-small cell lung cancer consistent with adenocarcinoma diagnosed in May of 2014, presented with right upper lobe lung mass in addition to mediastinal lymphadenopathy.  PRIOR THERAPY:  None.  CURRENT THERAPY: Concurrent chemoradiation with weekly carboplatin for AUC of 2 and paclitaxel 45 mg/M2, status post 1 cycle.  INTERVAL HISTORY: Carlos Goodwin 63 y.o. male returns to the clinic today for followup visit accompanied by his wife. The patient tolerated the first cycle of this treatment fairly well with no significant adverse effects. He denied having any significant fever or chills, no nausea or vomiting. He has no significant chest pain but continues to have shortness of breath with exertion with mild cough with no hemoptysis. He lost a few pounds recently. He denied having any dysphagia or odynophagia. He is scheduled for a second cycle of his chemotherapy tomorrow.  MEDICAL HISTORY: Past Medical History  Diagnosis Date  . COPD (chronic obstructive pulmonary disease)   . Coronary artery disease   . Sleep apnea   . Hypertension   . High cholesterol   . MI (myocardial infarction) 2004  . GERD (gastroesophageal reflux disease)   . Family history of anesthesia complication     Daughter has nausea and vomiting  . Shortness of breath   . Cancer     lungs x 3 spots    ALLERGIES:  is allergic to plavix and other.  MEDICATIONS:  Current Outpatient Prescriptions  Medication Sig Dispense Refill  . albuterol (PROVENTIL HFA;VENTOLIN HFA) 108 (90 BASE) MCG/ACT inhaler Inhale 1-2 puffs into the lungs every 6 (six) hours as needed for wheezing or shortness of breath.      Marland Kitchen aspirin EC 81 MG tablet Take 81 mg by mouth at bedtime.      . metoprolol (LOPRESSOR) 100 MG tablet Take  100 mg by mouth at bedtime.      . ranitidine (ZANTAC) 150 MG tablet Take 150 mg by mouth daily.      . simvastatin (ZOCOR) 40 MG tablet Take 40 mg by mouth every evening.      . tiotropium (SPIRIVA) 18 MCG inhalation capsule Place 18 mcg into inhaler and inhale daily.      . Wound Cleansers (RADIAPLEX EX) Apply topically.      . prochlorperazine (COMPAZINE) 10 MG tablet Take 1 tablet (10 mg total) by mouth every 6 (six) hours as needed for nausea or vomiting.  60 tablet  0   No current facility-administered medications for this visit.    SURGICAL HISTORY:  Past Surgical History  Procedure Laterality Date  . Appendectomy    . Coronary angioplasty  2004    1 stent  . Anterior cruciate ligament repair Right   . Tonsillectomy    . Prostate biopsy N/A 06/13/2013    Procedure: ULTRASOUND GUIDED PROSTATE BIOPSY;  Surgeon: Marissa Nestle, MD;  Location: [facility];  Service: Urology;  Laterality: N/A;  . Video bronchoscopy with endobronchial navigation N/A 12/12/2013    Procedure: VIDEO BRONCHOSCOPY WITH ENDOBRONCHIAL NAVIGATION;  Surgeon: Melrose Nakayama, MD;  Location: [place];  Service: Thoracic;  Laterality: N/A;  . Video bronchoscopy with endobronchial ultrasound N/A 12/12/2013    Procedure: VIDEO BRONCHOSCOPY WITH ENDOBRONCHIAL ULTRASOUND;  Surgeon: Melrose Nakayama, MD;  Location: [facility];  Service: Thoracic;  Laterality: N/A;    REVIEW OF SYSTEMS:  A comprehensive review of systems was negative except for: Respiratory: positive for dyspnea on exertion   PHYSICAL EXAMINATION: General appearance: alert, cooperative and no distress Head: Normocephalic, without obvious abnormality, atraumatic Neck: no adenopathy, no JVD, supple, symmetrical, trachea midline and thyroid not enlarged, symmetric, no tenderness/mass/nodules Lymph nodes: Cervical, supraclavicular, and axillary nodes normal. Resp: clear to auscultation bilaterally Back: symmetric, no curvature. ROM normal. No CVA  tenderness. Cardio: regular rate and rhythm, S1, S2 normal, no murmur, click, rub or gallop GI: soft, non-tender; bowel sounds normal; no masses,  no organomegaly Extremities: extremities normal, atraumatic, no cyanosis or edema  ECOG PERFORMANCE STATUS: 1 - Symptomatic but completely ambulatory  Blood pressure 131/66, pulse 79, temperature 98.7 F (37.1 C), temperature source Oral, resp. rate 18, height 5\' 10"  (1.778 m), weight 268 lb 9.6 oz (121.836 kg).  LABORATORY DATA: Lab Results  Component Value Date   WBC 8.7 01/02/2014   HGB 15.6 01/02/2014   HCT 47.9 01/02/2014   MCV 86.2 01/02/2014   PLT 273 01/02/2014      Chemistry      Component Value Date/Time   NA 140 01/02/2014 1017   NA 141 12/12/2013 0626   K 4.8 01/02/2014 1017   K 4.6 12/12/2013 0626   CL 101 12/08/2013 1029   CO2 30* 01/02/2014 1017   CO2 30 12/08/2013 1029   BUN 17.3 01/02/2014 1017   BUN 15 12/08/2013 1029   CREATININE 0.9 01/02/2014 1017   CREATININE 0.84 12/08/2013 1029      Component Value Date/Time   CALCIUM 9.5 01/02/2014 1017   CALCIUM 9.4 12/08/2013 1029   ALKPHOS 65 01/02/2014 1017   AST 11 01/02/2014 1017   ALT 15 01/02/2014 1017   BILITOT 0.41 01/02/2014 1017       RADIOGRAPHIC STUDIES: Dg Chest 2 View  12/08/2013   CLINICAL DATA:  Lung mass.  EXAM: CHEST  2 VIEW  COMPARISON:  Chest x-rays dated 03/07/2013 and 07/03/2009 and chest CT dated 12/06/2013  FINDINGS: There is an ill-defined mass at the right lung apex medially. There is slight fullness on the right hilum. There are multiple pleural calcifications bilaterally. There is chronic pleural thickening at the left lung base. Heart size and pulmonary vascularity are normal.  No effusions.  No acute osseous abnormality.  IMPRESSION: Subtle poorly defined mass in the right lung apex. Slight fullness of the right hilum consistent with adenopathy demonstrated on the CT scan.  Stable chronic pleural changes.   Electronically Signed   By: Rozetta Nunnery M.D.   On:  12/08/2013 11:14   Ct Chest Wo Contrast  12/06/2013   CLINICAL DATA:  Follow-up lung nodules on CT neck  EXAM: CT CHEST WITHOUT CONTRAST  TECHNIQUE: Multidetector CT imaging of the chest was performed following the standard protocol without IV contrast.  COMPARISON:  Partial comparison to CT neck dated 11/18/2013  FINDINGS: 3.9 x 3.8 x 4.6 cm mass in the medial right lung apex (series 5/image 10), suspicious for primary bronchogenic neoplasm.  No definite invasion of the chest wall or adjacent right 1st/2nd ribs.  Additional 2.6 x 1.6 cm nodule in the central right upper lobe (series 5/ image 18).  Calcified pleural plaques along the anterior right upper lobe (series 5/ image 20) and left lung base (series 5/ image 52), suggesting asbestos related pleural disease. Mild fibrosis/parenchymal banding with volume loss in the left lower lobe (series 5/image 51).  No pleural effusion  or pneumothorax.  Visualized thyroid is unremarkable.  The heart is normal in size. No pericardial effusion. Coronary atherosclerosis with LAD stent. Atherosclerotic calcifications of the aortic arch.  Thoracic lymphadenopathy, including:  --8 mm short axis right supraclavicular node (series 2/image 3), indeterminate  --Right paratracheal nodes measuring up to 1.6 cm short axis (series 2/ image 20)  --2.5 cm short axis right hilar node (series 2/ image 29)  --1.1 cm short axis subcarinal node (series 2/image 30)  Visualized upper abdomen is unremarkable. Bilateral adrenal glands were not imaged.  Degenerative changes of the visualized thoracolumbar spine.  IMPRESSION: 4.6 cm mass in the medial right lung apex, suspicious for primary bronchogenic neoplasm.  Additional 2.6 cm nodule in the central right upper lobe.  Mediastinal/right hilar lymphadenopathy, suspicious for nodal metastases, as above.  Asbestos related pleural disease.   Electronically Signed   By: Julian Hy M.D.   On: 12/06/2013 14:48   Mr Jeri Cos CZ  Contrast  12/13/2013   CLINICAL DATA:  New diagnosis lung cancer.  Weakness.  EXAM: MRI HEAD WITHOUT AND WITH CONTRAST  TECHNIQUE: Multiplanar, multiecho pulse sequences of the brain and surrounding structures were obtained without and with intravenous contrast.  CONTRAST:  24mL MULTIHANCE GADOBENATE DIMEGLUMINE 529 MG/ML IV SOLN  COMPARISON:  None.  FINDINGS: Diffusion imaging does not show any acute or subacute infarction. There are minimal chronic small vessel changes within the pons. No cerebellar insult. The cerebral hemispheres show mild chronic small-vessel changes within the deep and subcortical white matter. No cortical or large vessel territory abnormality. No evidence of primary or metastatic mass lesion. No pituitary mass. No hydrocephalus or extra-axial collection. There is some mucosal inflammation of the right maxillary sinus with a small fluid level. No skull or skullbase lesion is seen.  IMPRESSION: No evidence of metastatic disease. Mild chronic small-vessel disease as outlined above. No acute finding.  Some inflammatory change of the right maxillary sinus.   Electronically Signed   By: Nelson Chimes M.D.   On: 12/13/2013 13:37   Nm Pet Image Initial (pi) Skull Base To Thigh  12/08/2013   CLINICAL DATA:  Initial treatment strategy for lung mass.  EXAM: NUCLEAR MEDICINE PET SKULL BASE TO THIGH  TECHNIQUE: 8.5 mCi F-18 FDG was injected intravenously. Full-ring PET imaging was performed from the skull base to thigh after the radiotracer. CT data was obtained and used for attenuation correction and anatomic localization.  FASTING BLOOD GLUCOSE:  Value: 85 mg/dl  COMPARISON:  CT 12/06/2013  FINDINGS: NECK  Theskullbase is inadvertently omitted. The scan begins at the level of the inferior maxillary bone. There are no hypermetabolic nodes in the neck.  CHEST  There is a hypermetabolic right apical mass with SUV max equaled 25.7. This mass measures 3.3 x 3.8 cm and abuts the right upper mediastinum  (image 34, series 4.  There is a hypermetabolic of right suprahilar nodule measuring 2.3 mm cm with SUV max 20.1.  There are hypermetabolic right paratracheal and subcarinal lymph nodes. Subcarinal lymph node is small measuring only 12 mm short axis with intensely metabolic with SUV maz equal 17.6. The highest hyper may right paratracheal lymph nodes measures 15 mm (image 43) with SUV max 15.5. No hypermetabolic supraclavicular lymph nodes.  ABDOMEN/PELVIS  No abnormal hypermetabolic activity within the liver, pancreas, adrenal glands, or spleen. No hypermetabolic lymph nodes in the abdomen or pelvis.  SKELETON  No focal hypermetabolic activity to suggest skeletal metastasis.  IMPRESSION: 1. Hypermetabolic right apical mass  consistent with primary bronchogenic carcinoma. Mass abuts the pleural surface but does not appear to invade the mediastinum or chest wall. 2. Hypermetabolic right suprahilar hilar lymph node or nodule. 3. Hypermetabolic ipsilateral and subcarinal metastatic lymph nodes. 4. No evidence of distant disease. 5. Staging by FDG PET-CT T2a N2 M0 for  non-small cell lung cancer.   Electronically Signed   By: Suzy Bouchard M.D.   On: 12/08/2013 15:19   Dg C-arm Bronchoscopy  12/12/2013   CLINICAL DATA: bronch   C-ARM BRONCHOSCOPY  Fluoroscopy was utilized by the requesting physician.  No radiographic  interpretation.     ASSESSMENT AND PLAN: This is a very pleasant 63 years old white male recently diagnosed with a stage IIIa non-small cell lung cancer currently undergoing a course of concurrent chemoradiation with weekly carboplatin and paclitaxel is status post 1 cycle. He tolerated the first cycle of his treatment fairly well.  I recommended for the patient to proceed with cycle #2 tomorrow as scheduled.  He would come back for followup visit in 2 weeks for evaluation and management any adverse effects of his treatment. He was advised to call immediately if he has any concerning symptoms in  the interval.  The patient voices understanding of current disease status and treatment options and is in agreement with the current care plan.  All questions were answered. The patient knows to call the clinic with any problems, questions or concerns. We can certainly see the patient much sooner if necessary.  Disclaimer: This note was dictated with voice recognition software. Similar sounding words can inadvertently be transcribed and may not be corrected upon review.

## 2014-01-02 NOTE — Addendum Note (Signed)
Encounter addended by: Heywood Footman, RN on: 01/02/2014 11:25 AM<BR>     Documentation filed: Chief Complaint Section, Notes Section, Inpatient Patient Education, Orders

## 2014-01-03 ENCOUNTER — Encounter (HOSPITAL_COMMUNITY): Payer: Self-pay

## 2014-01-03 ENCOUNTER — Ambulatory Visit (HOSPITAL_BASED_OUTPATIENT_CLINIC_OR_DEPARTMENT_OTHER): Payer: Medicare Other

## 2014-01-03 ENCOUNTER — Ambulatory Visit
Admission: RE | Admit: 2014-01-03 | Discharge: 2014-01-03 | Disposition: A | Discharge status: Home / Self Care | Payer: Medicare Other | Source: Ambulatory Visit | Attending: Radiation Oncology | Admitting: Radiation Oncology

## 2014-01-03 VITALS — BP 119/78 | HR 84 | Temp 98.3°F | Resp 20

## 2014-01-03 DIAGNOSIS — Z5111 Encounter for antineoplastic chemotherapy: Secondary | ICD-10-CM

## 2014-01-03 DIAGNOSIS — C781 Secondary malignant neoplasm of mediastinum: Secondary | ICD-10-CM

## 2014-01-03 DIAGNOSIS — Z510 Encounter for antineoplastic radiation therapy: Secondary | ICD-10-CM | POA: Diagnosis not present

## 2014-01-03 DIAGNOSIS — C3491 Malignant neoplasm of unspecified part of right bronchus or lung: Secondary | ICD-10-CM

## 2014-01-03 DIAGNOSIS — R0602 Shortness of breath: Secondary | ICD-10-CM

## 2014-01-03 DIAGNOSIS — C3410 Malignant neoplasm of upper lobe, unspecified bronchus or lung: Secondary | ICD-10-CM

## 2014-01-03 MED ORDER — METHYLPREDNISOLONE SODIUM SUCC 125 MG IJ SOLR
125.0000 mg | Freq: Once | INTRAMUSCULAR | Status: AC | PRN
  Administered 2014-01-03: 125 mg via INTRAVENOUS

## 2014-01-03 MED ORDER — FAMOTIDINE IN NACL 20-0.9 MG/50ML-% IV SOLN
20.0000 mg | Freq: Once | INTRAVENOUS | Status: AC
  Administered 2014-01-03: 20 mg via INTRAVENOUS

## 2014-01-03 MED ORDER — PACLITAXEL CHEMO INJECTION 300 MG/50ML
45.0000 mg/m2 | Freq: Once | INTRAVENOUS | Status: AC
  Administered 2014-01-03: 114 mg via INTRAVENOUS
  Filled 2014-01-03: qty 19

## 2014-01-03 MED ORDER — DEXAMETHASONE SODIUM PHOSPHATE 20 MG/5ML IJ SOLN
20.0000 mg | Freq: Once | INTRAMUSCULAR | Status: AC
  Administered 2014-01-03: 20 mg via INTRAVENOUS

## 2014-01-03 MED ORDER — FAMOTIDINE IN NACL 20-0.9 MG/50ML-% IV SOLN
INTRAVENOUS | Status: AC
  Filled 2014-01-03: qty 50

## 2014-01-03 MED ORDER — ALBUTEROL SULFATE (2.5 MG/3ML) 0.083% IN NEBU
2.5000 mg | INHALATION_SOLUTION | Freq: Once | RESPIRATORY_TRACT | Status: AC | PRN
  Administered 2014-01-03: 2.5 mg via RESPIRATORY_TRACT
  Filled 2014-01-03: qty 3

## 2014-01-03 MED ORDER — DIPHENHYDRAMINE HCL 50 MG/ML IJ SOLN
50.0000 mg | Freq: Once | INTRAMUSCULAR | Status: AC
  Administered 2014-01-03: 50 mg via INTRAVENOUS

## 2014-01-03 MED ORDER — SODIUM CHLORIDE 0.9 % IV SOLN
300.0000 mg | Freq: Once | INTRAVENOUS | Status: AC
  Administered 2014-01-03: 300 mg via INTRAVENOUS
  Filled 2014-01-03: qty 30

## 2014-01-03 MED ORDER — DIPHENHYDRAMINE HCL 50 MG/ML IJ SOLN
25.0000 mg | Freq: Once | INTRAMUSCULAR | Status: AC | PRN
  Administered 2014-01-03: 25 mg via INTRAVENOUS

## 2014-01-03 MED ORDER — ONDANSETRON 16 MG/50ML IVPB (CHCC)
INTRAVENOUS | Status: AC
  Filled 2014-01-03: qty 16

## 2014-01-03 MED ORDER — DEXAMETHASONE SODIUM PHOSPHATE 20 MG/5ML IJ SOLN
INTRAMUSCULAR | Status: AC
  Filled 2014-01-03: qty 5

## 2014-01-03 MED ORDER — SODIUM CHLORIDE 0.9 % IV SOLN
Freq: Once | INTRAVENOUS | Status: AC
  Administered 2014-01-03: 12:00:00 via INTRAVENOUS

## 2014-01-03 MED ORDER — DIPHENHYDRAMINE HCL 50 MG/ML IJ SOLN
INTRAMUSCULAR | Status: AC
  Filled 2014-01-03: qty 1

## 2014-01-03 MED ORDER — ONDANSETRON 16 MG/50ML IVPB (CHCC)
16.0000 mg | Freq: Once | INTRAVENOUS | Status: AC
  Administered 2014-01-03: 16 mg via INTRAVENOUS

## 2014-01-03 NOTE — Progress Notes (Signed)
1430 -  Pt continued to have O2 sat 89 - 91 on 2L [place].  Sleepy but arousable and answered questions appropriately on command.  Denied short of breath, denied chest pain or tightness in chest.  Stated " feeling great ".  Dr. Julien Nordmann notified of pt's latest vital signs and pt's status.  OK to proceed with Carboplatin as ordered.   Wife inquired whether pt would receive Taxol with next treatment.  Dr. Julien Nordmann notified.  Per Dr. Julien Nordmann, no more Taxol, just Carboplatin only.  Information relayed to both pt and wife.  Both voiced understanding. 1555 -   Pt completed Carboplatin infusion without problems.  VSS.  Pt alert and oriented x 3.  Pt was offered several times of discharged via wheelchair for his safety due to having Benadryl.  Pt refused each time and insisted on ambulating out with wife.  Pt was discharged home via ambulation with wife and Rollene Fare, Electrical engineer.

## 2014-01-03 NOTE — Patient Instructions (Signed)
[facility] Discharge Instructions for Patients Receiving Chemotherapy  Today you received the following chemotherapy agents: Taxol and Carboplatin.  To help prevent nausea and vomiting after your treatment, we encourage you to take your nausea medication as prescribed.   If you develop nausea and vomiting that is not controlled by your nausea medication, call the clinic.   BELOW ARE SYMPTOMS THAT SHOULD BE REPORTED IMMEDIATELY:  *FEVER GREATER THAN 100.5 F  *CHILLS WITH OR WITHOUT FEVER  NAUSEA AND VOMITING THAT IS NOT CONTROLLED WITH YOUR NAUSEA MEDICATION  *UNUSUAL SHORTNESS OF BREATH  *UNUSUAL BRUISING OR BLEEDING  TENDERNESS IN MOUTH AND THROAT WITH OR WITHOUT PRESENCE OF ULCERS  *URINARY PROBLEMS  *BOWEL PROBLEMS  UNUSUAL RASH Items with * indicate a potential emergency and should be followed up as soon as possible.  Feel free to call the clinic you have any questions or concerns. The clinic phone number is [phone].    

## 2014-01-03 NOTE — Progress Notes (Signed)
1255-Pt states that he is SOB and "can't catch breath".  Taxol stopped immediately and NS started at wide open.  Pt grey in color and has red blood shot, watering eyes.  O2 sat 55% on RA. O2 applied at 8L [place].  Dr. Earlie Server in infusion room at time of reaction.  Benadryl 25 mg. IV given at 1303 and Solumedrol 125 mg. IV given at 1304.  VS closely monitored. Albuterol nebulizer given at 1308.  Pt states that his breathing is "feeling better" at this time.  Pt monitored closely.

## 2014-01-04 ENCOUNTER — Ambulatory Visit
Admission: RE | Admit: 2014-01-04 | Discharge: 2014-01-04 | Disposition: A | Discharge status: Home / Self Care | Payer: Medicare Other | Source: Ambulatory Visit | Attending: Radiation Oncology | Admitting: Radiation Oncology

## 2014-01-04 ENCOUNTER — Encounter: Payer: Self-pay | Admitting: Radiation Oncology

## 2014-01-04 VITALS — BP 108/75 | HR 68 | Temp 97.8°F | Resp 20 | Wt 271.4 lb

## 2014-01-04 DIAGNOSIS — Z510 Encounter for antineoplastic radiation therapy: Secondary | ICD-10-CM | POA: Diagnosis not present

## 2014-01-04 DIAGNOSIS — C3410 Malignant neoplasm of upper lobe, unspecified bronchus or lung: Secondary | ICD-10-CM

## 2014-01-04 NOTE — Progress Notes (Signed)
  Radiation Oncology         [phone] ________________________________  Name: Carlos Goodwin MRN: [id number]  Date: 01/04/2014  DOB: 07-22-1951   Weekly Radiation Therapy Management  Current Dose: 16 Gy     Planned Dose:  66 Gy  Narrative . . . . . . . . The patient presents for routine under treatment assessment.                                   The patient is without complaint.  Pt denies pain, SOB, cough, fatigue, loss of appetite. He states he "couldn't breath yesterday when he was receiving Taxol". He states the chemo was stopped, and he was given breathing treatment. He states he "is fine now", denies other problems, issues                                 Set-up films were reviewed.                                 The chart was checked. Physical Findings. . .  weight is 271 lb 6.4 oz (123.106 kg). His oral temperature is 97.8 F (36.6 C). His blood pressure is 108/75 and his pulse is 68. His respiration is 20. . Weight essentially stable.  No significant changes. Impression . . . . . . . The patient is tolerating radiation. Plan . . . . . . . . . . . . Continue treatment as planned.  ________________________________  Sheral Apley. Tammi Klippel, M.D.

## 2014-01-04 NOTE — Progress Notes (Signed)
Pt denies pain, SOB, cough, fatigue, loss of appetite. He states he "couldn't breath yesterday when he was receiving Taxol". He states the chemo was stopped, and he was given breathing treatment. He states he "is fine now", denies other problems, issues.

## 2014-01-05 ENCOUNTER — Ambulatory Visit
Admission: RE | Admit: 2014-01-05 | Discharge: 2014-01-05 | Disposition: A | Discharge status: Home / Self Care | Payer: Medicare Other | Source: Ambulatory Visit | Attending: Radiation Oncology | Admitting: Radiation Oncology

## 2014-01-05 DIAGNOSIS — Z510 Encounter for antineoplastic radiation therapy: Secondary | ICD-10-CM | POA: Diagnosis not present

## 2014-01-08 ENCOUNTER — Other Ambulatory Visit (HOSPITAL_BASED_OUTPATIENT_CLINIC_OR_DEPARTMENT_OTHER): Payer: Medicare Other

## 2014-01-08 ENCOUNTER — Ambulatory Visit (HOSPITAL_BASED_OUTPATIENT_CLINIC_OR_DEPARTMENT_OTHER): Payer: Medicare Other

## 2014-01-08 ENCOUNTER — Ambulatory Visit
Admission: RE | Admit: 2014-01-08 | Discharge: 2014-01-08 | Disposition: A | Discharge status: Home / Self Care | Payer: Medicare Other | Source: Ambulatory Visit | Attending: Radiation Oncology | Admitting: Radiation Oncology

## 2014-01-08 VITALS — BP 131/77 | HR 70 | Temp 98.7°F | Resp 18

## 2014-01-08 DIAGNOSIS — Z5111 Encounter for antineoplastic chemotherapy: Secondary | ICD-10-CM

## 2014-01-08 DIAGNOSIS — C3491 Malignant neoplasm of unspecified part of right bronchus or lung: Secondary | ICD-10-CM

## 2014-01-08 DIAGNOSIS — Z510 Encounter for antineoplastic radiation therapy: Secondary | ICD-10-CM | POA: Diagnosis not present

## 2014-01-08 DIAGNOSIS — C3410 Malignant neoplasm of upper lobe, unspecified bronchus or lung: Secondary | ICD-10-CM

## 2014-01-08 DIAGNOSIS — C781 Secondary malignant neoplasm of mediastinum: Secondary | ICD-10-CM

## 2014-01-08 LAB — CBC WITH DIFFERENTIAL/PLATELET
BASO%: 0.4 % (ref 0.0–2.0)
BASOS ABS: 0 10*3/uL (ref 0.0–0.1)
EOS%: 2.2 % (ref 0.0–7.0)
Eosinophils Absolute: 0.2 10*3/uL (ref 0.0–0.5)
HCT: 49 % (ref 38.4–49.9)
HEMOGLOBIN: 15.7 g/dL (ref 13.0–17.1)
LYMPH#: 0.8 10*3/uL — AB (ref 0.9–3.3)
LYMPH%: 9.8 % — ABNORMAL LOW (ref 14.0–49.0)
MCH: 28 pg (ref 27.2–33.4)
MCHC: 32 g/dL (ref 32.0–36.0)
MCV: 87.3 fL (ref 79.3–98.0)
MONO#: 0.7 10*3/uL (ref 0.1–0.9)
MONO%: 8.7 % (ref 0.0–14.0)
NEUT#: 6.5 10*3/uL (ref 1.5–6.5)
NEUT%: 78.9 % — ABNORMAL HIGH (ref 39.0–75.0)
Platelets: 244 10*3/uL (ref 140–400)
RBC: 5.61 10*6/uL (ref 4.20–5.82)
RDW: 14.2 % (ref 11.0–14.6)
WBC: 8.3 10*3/uL (ref 4.0–10.3)

## 2014-01-08 LAB — COMPREHENSIVE METABOLIC PANEL (CC13)
ALT: 15 U/L (ref 0–55)
AST: 11 U/L (ref 5–34)
Albumin: 2.8 g/dL — ABNORMAL LOW (ref 3.5–5.0)
Alkaline Phosphatase: 73 U/L (ref 40–150)
Anion Gap: 12 mEq/L — ABNORMAL HIGH (ref 3–11)
BUN: 19.3 mg/dL (ref 7.0–26.0)
CALCIUM: 10.2 mg/dL (ref 8.4–10.4)
CHLORIDE: 100 meq/L (ref 98–109)
CO2: 28 mEq/L (ref 22–29)
CREATININE: 1 mg/dL (ref 0.7–1.3)
Glucose: 107 mg/dl (ref 70–140)
Potassium: 5.6 mEq/L — ABNORMAL HIGH (ref 3.5–5.1)
Sodium: 140 mEq/L (ref 136–145)
Total Bilirubin: 0.4 mg/dL (ref 0.20–1.20)
Total Protein: 7.1 g/dL (ref 6.4–8.3)

## 2014-01-08 MED ORDER — ONDANSETRON 16 MG/50ML IVPB (CHCC)
16.0000 mg | Freq: Once | INTRAVENOUS | Status: AC
  Administered 2014-01-08: 16 mg via INTRAVENOUS

## 2014-01-08 MED ORDER — DEXAMETHASONE SODIUM PHOSPHATE 20 MG/5ML IJ SOLN
INTRAMUSCULAR | Status: AC
  Filled 2014-01-08: qty 5

## 2014-01-08 MED ORDER — DEXAMETHASONE SODIUM PHOSPHATE 20 MG/5ML IJ SOLN
20.0000 mg | Freq: Once | INTRAMUSCULAR | Status: AC
  Administered 2014-01-08: 20 mg via INTRAVENOUS

## 2014-01-08 MED ORDER — SODIUM CHLORIDE 0.9 % IV SOLN
300.0000 mg | Freq: Once | INTRAVENOUS | Status: AC
  Administered 2014-01-08: 300 mg via INTRAVENOUS
  Filled 2014-01-08: qty 30

## 2014-01-08 MED ORDER — ONDANSETRON 16 MG/50ML IVPB (CHCC)
INTRAVENOUS | Status: AC
  Filled 2014-01-08: qty 16

## 2014-01-08 MED ORDER — SODIUM CHLORIDE 0.9 % IV SOLN
Freq: Once | INTRAVENOUS | Status: AC
  Administered 2014-01-08: 11:00:00 via INTRAVENOUS

## 2014-01-08 NOTE — Patient Instructions (Signed)
[facility] Discharge Instructions for Patients Receiving Chemotherapy  Today you received the following chemotherapy agents Carboplatin To help prevent nausea and vomiting after your treatment, we encourage you to take your nausea medication as prescribed.  If you develop nausea and vomiting that is not controlled by your nausea medication, call the clinic.   BELOW ARE SYMPTOMS THAT SHOULD BE REPORTED IMMEDIATELY:  *FEVER GREATER THAN 100.5 F  *CHILLS WITH OR WITHOUT FEVER  NAUSEA AND VOMITING THAT IS NOT CONTROLLED WITH YOUR NAUSEA MEDICATION  *UNUSUAL SHORTNESS OF BREATH  *UNUSUAL BRUISING OR BLEEDING  TENDERNESS IN MOUTH AND THROAT WITH OR WITHOUT PRESENCE OF ULCERS  *URINARY PROBLEMS  *BOWEL PROBLEMS  UNUSUAL RASH Items with * indicate a potential emergency and should be followed up as soon as possible.  Feel free to call the clinic you have any questions or concerns. The clinic phone number is [phone].

## 2014-01-09 ENCOUNTER — Ambulatory Visit
Admission: RE | Admit: 2014-01-09 | Discharge: 2014-01-09 | Disposition: A | Discharge status: Home / Self Care | Payer: Medicare Other | Source: Ambulatory Visit | Attending: Radiation Oncology | Admitting: Radiation Oncology

## 2014-01-09 DIAGNOSIS — Z510 Encounter for antineoplastic radiation therapy: Secondary | ICD-10-CM | POA: Diagnosis not present

## 2014-01-10 ENCOUNTER — Encounter: Payer: Self-pay | Admitting: Radiation Oncology

## 2014-01-10 ENCOUNTER — Ambulatory Visit
Admission: RE | Admit: 2014-01-10 | Discharge: 2014-01-10 | Disposition: A | Discharge status: Home / Self Care | Payer: Medicare Other | Source: Ambulatory Visit | Attending: Radiation Oncology | Admitting: Radiation Oncology

## 2014-01-10 VITALS — BP 112/72 | HR 87 | Resp 16 | Wt 272.0 lb

## 2014-01-10 DIAGNOSIS — C3410 Malignant neoplasm of upper lobe, unspecified bronchus or lung: Secondary | ICD-10-CM

## 2014-01-10 DIAGNOSIS — Z510 Encounter for antineoplastic radiation therapy: Secondary | ICD-10-CM | POA: Diagnosis not present

## 2014-01-10 NOTE — Progress Notes (Signed)
Patient reports productive cough with clear sputum worse in the morning. Denies difficulty swallowing or shortness of breath. Denies fatigue. Vitals stable. Denies pain. Weight stable. Denies skin changes within treatment field and reports that he continues to use radiaplex as directed.

## 2014-01-10 NOTE — Progress Notes (Signed)
  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 . . . . . . . The patient presents for routine under treatment assessment.                                  Patient reports productive cough with clear sputum worse in the morning. Denies difficulty swallowing or shortness of breath. Denies fatigue. Vitals stable. Denies pain. Weight stable. Denies skin changes within treatment field and reports that he continues to use radiaplex as directed                                 Set-up films were reviewed.                                 The chart was checked. Physical Findings. . .  weight is 272 lb (123.378 kg). His blood pressure is 112/72 and his pulse is 87. His respiration is 16 and oxygen saturation is 96%. . Weight essentially stable.  No significant changes. Impression . . . . . . . The patient is tolerating radiation. Plan . . . . . . . . . . . . Continue treatment as planned.  ________________________________  Sheral Apley. Tammi Klippel, M.D.

## 2014-01-11 ENCOUNTER — Ambulatory Visit
Admission: RE | Admit: 2014-01-11 | Discharge: 2014-01-11 | Disposition: A | Discharge status: Home / Self Care | Payer: Medicare Other | Source: Ambulatory Visit | Attending: Radiation Oncology | Admitting: Radiation Oncology

## 2014-01-11 DIAGNOSIS — Z510 Encounter for antineoplastic radiation therapy: Secondary | ICD-10-CM | POA: Diagnosis not present

## 2014-01-12 ENCOUNTER — Ambulatory Visit
Admission: RE | Admit: 2014-01-12 | Discharge: 2014-01-12 | Disposition: A | Discharge status: Home / Self Care | Payer: Medicare Other | Source: Ambulatory Visit | Attending: Radiation Oncology | Admitting: Radiation Oncology

## 2014-01-12 DIAGNOSIS — Z510 Encounter for antineoplastic radiation therapy: Secondary | ICD-10-CM | POA: Diagnosis not present

## 2014-01-15 ENCOUNTER — Ambulatory Visit
Admission: RE | Admit: 2014-01-15 | Discharge: 2014-01-15 | Disposition: A | Discharge status: Home / Self Care | Payer: Medicare Other | Source: Ambulatory Visit | Attending: Radiation Oncology | Admitting: Radiation Oncology

## 2014-01-15 ENCOUNTER — Telehealth: Payer: Self-pay | Admitting: Internal Medicine

## 2014-01-15 ENCOUNTER — Encounter: Payer: Self-pay | Admitting: Internal Medicine

## 2014-01-15 ENCOUNTER — Other Ambulatory Visit (HOSPITAL_BASED_OUTPATIENT_CLINIC_OR_DEPARTMENT_OTHER): Payer: Medicare Other

## 2014-01-15 ENCOUNTER — Ambulatory Visit (HOSPITAL_BASED_OUTPATIENT_CLINIC_OR_DEPARTMENT_OTHER): Payer: Medicare Other

## 2014-01-15 ENCOUNTER — Ambulatory Visit (HOSPITAL_BASED_OUTPATIENT_CLINIC_OR_DEPARTMENT_OTHER): Payer: Medicare Other | Admitting: Internal Medicine

## 2014-01-15 VITALS — BP 108/72 | HR 83 | Temp 98.4°F | Resp 20 | Ht 70.0 in | Wt 266.3 lb

## 2014-01-15 DIAGNOSIS — C3410 Malignant neoplasm of upper lobe, unspecified bronchus or lung: Secondary | ICD-10-CM

## 2014-01-15 DIAGNOSIS — C3491 Malignant neoplasm of unspecified part of right bronchus or lung: Secondary | ICD-10-CM

## 2014-01-15 DIAGNOSIS — Z510 Encounter for antineoplastic radiation therapy: Secondary | ICD-10-CM | POA: Diagnosis not present

## 2014-01-15 DIAGNOSIS — Z5111 Encounter for antineoplastic chemotherapy: Secondary | ICD-10-CM

## 2014-01-15 DIAGNOSIS — C3490 Malignant neoplasm of unspecified part of unspecified bronchus or lung: Secondary | ICD-10-CM

## 2014-01-15 LAB — COMPREHENSIVE METABOLIC PANEL (CC13)
ALK PHOS: 71 U/L (ref 40–150)
ALT: 15 U/L (ref 0–55)
AST: 11 U/L (ref 5–34)
Albumin: 2.8 g/dL — ABNORMAL LOW (ref 3.5–5.0)
Anion Gap: 9 mEq/L (ref 3–11)
BUN: 16.8 mg/dL (ref 7.0–26.0)
CALCIUM: 9.5 mg/dL (ref 8.4–10.4)
CO2: 27 mEq/L (ref 22–29)
Chloride: 101 mEq/L (ref 98–109)
Creatinine: 0.9 mg/dL (ref 0.7–1.3)
Glucose: 122 mg/dl (ref 70–140)
POTASSIUM: 5 meq/L (ref 3.5–5.1)
Sodium: 137 mEq/L (ref 136–145)
Total Bilirubin: 0.39 mg/dL (ref 0.20–1.20)
Total Protein: 7.2 g/dL (ref 6.4–8.3)

## 2014-01-15 LAB — CBC WITH DIFFERENTIAL/PLATELET
BASO%: 0.9 % (ref 0.0–2.0)
Basophils Absolute: 0.1 10*3/uL (ref 0.0–0.1)
EOS%: 1.8 % (ref 0.0–7.0)
Eosinophils Absolute: 0.2 10*3/uL (ref 0.0–0.5)
HCT: 49.1 % (ref 38.4–49.9)
HEMOGLOBIN: 15.9 g/dL (ref 13.0–17.1)
LYMPH%: 4.9 % — ABNORMAL LOW (ref 14.0–49.0)
MCH: 27.6 pg (ref 27.2–33.4)
MCHC: 32.3 g/dL (ref 32.0–36.0)
MCV: 85.7 fL (ref 79.3–98.0)
MONO#: 0.6 10*3/uL (ref 0.1–0.9)
MONO%: 6.6 % (ref 0.0–14.0)
NEUT%: 85.8 % — ABNORMAL HIGH (ref 39.0–75.0)
NEUTROS ABS: 7.3 10*3/uL — AB (ref 1.5–6.5)
PLATELETS: 198 10*3/uL (ref 140–400)
RBC: 5.73 10*6/uL (ref 4.20–5.82)
RDW: 14.2 % (ref 11.0–14.6)
WBC: 8.5 10*3/uL (ref 4.0–10.3)
lymph#: 0.4 10*3/uL — ABNORMAL LOW (ref 0.9–3.3)

## 2014-01-15 MED ORDER — DEXAMETHASONE SODIUM PHOSPHATE 20 MG/5ML IJ SOLN
INTRAMUSCULAR | Status: AC
  Filled 2014-01-15: qty 5

## 2014-01-15 MED ORDER — SODIUM CHLORIDE 0.9 % IV SOLN
300.0000 mg | Freq: Once | INTRAVENOUS | Status: AC
  Administered 2014-01-15: 300 mg via INTRAVENOUS
  Filled 2014-01-15: qty 30

## 2014-01-15 MED ORDER — SODIUM CHLORIDE 0.9 % IV SOLN
Freq: Once | INTRAVENOUS | Status: AC
  Administered 2014-01-15: 11:00:00 via INTRAVENOUS

## 2014-01-15 MED ORDER — ONDANSETRON 16 MG/50ML IVPB (CHCC)
16.0000 mg | Freq: Once | INTRAVENOUS | Status: AC
  Administered 2014-01-15: 16 mg via INTRAVENOUS

## 2014-01-15 MED ORDER — ONDANSETRON 16 MG/50ML IVPB (CHCC)
INTRAVENOUS | Status: AC
  Filled 2014-01-15: qty 16

## 2014-01-15 MED ORDER — DEXAMETHASONE SODIUM PHOSPHATE 20 MG/5ML IJ SOLN
20.0000 mg | Freq: Once | INTRAMUSCULAR | Status: AC
  Administered 2014-01-15: 20 mg via INTRAVENOUS

## 2014-01-15 NOTE — Telephone Encounter (Signed)
gv adn printed aptp sched and avs for pt for June.Marland KitchenMarland Kitchen

## 2014-01-15 NOTE — Patient Instructions (Signed)
Smoking Cessation, Tips for Success If you are ready to quit smoking, congratulations! You have chosen to help yourself be healthier. Cigarettes bring nicotine, tar, carbon monoxide, and other irritants into your body. Your lungs, heart, and blood vessels will be able to work better without these poisons. There are many different ways to quit smoking. Nicotine gum, nicotine patches, a nicotine inhaler, or nicotine nasal spray can help with physical craving. Hypnosis, support groups, and medicines help break the habit of smoking. WHAT THINGS CAN I DO TO MAKE QUITTING EASIER?  Here are some tips to help you quit for good:  Pick a date when you will quit smoking completely. Tell all of your friends and family about your plan to quit on that date.  Do not try to slowly cut down on the number of cigarettes you are smoking. Pick a quit date and quit smoking completely starting on that day.  Throw away all cigarettes.   Clean and remove all ashtrays from your home, work, and car.   On a card, write down your reasons for quitting. Carry the card with you and read it when you get the urge to smoke.   Cleanse your body of nicotine. Drink enough water and fluids to keep your urine clear or pale yellow. Do this after quitting to flush the nicotine from your body.   Learn to predict your moods. Do not let a bad situation be your excuse to have a cigarette. Some situations in your life might tempt you into wanting a cigarette.   Never have "just one" cigarette. It leads to wanting another and another. Remind yourself of your decision to quit.   Change habits associated with smoking. If you smoked while driving or when feeling stressed, try other activities to replace smoking. Stand up when drinking your coffee. Brush your teeth after eating. Sit in a different chair when you read the paper. Avoid alcohol while trying to quit, and try to drink fewer caffeinated beverages. Alcohol and caffeine may urge  you to smoke.   Avoid foods and drinks that can trigger a desire to smoke, such as sugary or spicy foods and alcohol.   Ask people who smoke not to smoke around you.   Have something planned to do right after eating or having a cup of coffee. For example, plan to take a walk or exercise.   Try a relaxation exercise to calm you down and decrease your stress. Remember, you may be tense and nervous for the first 2 weeks after you quit, but this will pass.   Find new activities to keep your hands busy. Play with a pen, coin, or rubber band. Doodle or draw things on paper.   Brush your teeth right after eating. This will help cut down on the craving for the taste of tobacco after meals. You can also try mouthwash.   Use oral substitutes in place of cigarettes. Try using lemon drops, carrots, cinnamon sticks, or chewing gum. Keep them handy so they are available when you have the urge to smoke.   When you have the urge to smoke, try deep breathing.   Designate your home as a nonsmoking area.   If you are a heavy smoker, ask your health care provider about a prescription for nicotine chewing gum. It can ease your withdrawal from nicotine.   Reward yourself. Set aside the cigarette money you save and buy yourself something nice.   Look for support from others. Join a support group or   smoking cessation program. Ask someone at home or at work to help you with your plan to quit smoking.   Always ask yourself, "Do I need this cigarette or is this just a reflex?" Tell yourself, "Today, I choose not to smoke," or "I do not want to smoke." You are reminding yourself of your decision to quit.  Do not replace cigarette smoking with electronic cigarettes (commonly called e-cigarettes). The safety of e-cigarettes is unknown, and some may contain harmful chemicals.  If you relapse, do not give up! Plan ahead and think about what you will do the next time you get the urge to smoke.  HOW WILL  I FEEL WHEN I QUIT SMOKING? You may have symptoms of withdrawal because your body is used to nicotine (the addictive substance in cigarettes). You may crave cigarettes, be irritable, feel very hungry, cough often, get headaches, or have difficulty concentrating. The withdrawal symptoms are only temporary. They are strongest when you first quit but will go away within 10 14 days. When withdrawal symptoms occur, stay in control. Think about your reasons for quitting. Remind yourself that these are signs that your body is healing and getting used to being without cigarettes. Remember that withdrawal symptoms are easier to treat than the major diseases that smoking can cause.  Even after the withdrawal is over, expect periodic urges to smoke. However, these cravings are generally short lived and will go away whether you smoke or not. Do not smoke!  WHAT RESOURCES ARE AVAILABLE TO HELP ME QUIT SMOKING? Your health care provider can direct you to community resources or hospitals for support, which may include:  Group support.  Education.  Hypnosis.  Therapy. Document Released: 04/24/2004 Document Revised: 05/17/2013 Document Reviewed: 01/12/2013 [employer] Patient Information 2014 [employer]  

## 2014-01-15 NOTE — Progress Notes (Signed)
[facility] [phone]   [phone]  OFFICE PROGRESS NOTE  Alonza Bogus, MD [place] [address]   DIAGNOSIS: Stage IIIA (T2a, N2, M0) non-small cell lung cancer consistent with adenocarcinoma diagnosed in May of 2014, presented with right upper lobe lung mass in addition to mediastinal lymphadenopathy.  PRIOR THERAPY:  None.  CURRENT THERAPY: Concurrent chemoradiation with weekly carboplatin for AUC of 2 and paclitaxel 45 mg/M2, status post 3 cycle. Paclitaxel was discontinued at cycle #3 secondary to hypersensitivity reaction.   INTERVAL HISTORY: Carlos Goodwin 63 y.o. male returns to the clinic today for followup visit accompanied by his wife. The patient tolerated the last cycle of this treatment fairly well with no significant adverse effects except for hypersensitivity reaction with the paclitaxel which was discontinued. He denied having any significant fever or chills, no nausea or vomiting. He has no significant chest pain but continues to have shortness of breath with exertion with mild cough with no hemoptysis. He lost a few pounds recently. He denied having any dysphagia or odynophagia but has mild sore throat.   MEDICAL HISTORY: Past Medical History  Diagnosis Date  . COPD (chronic obstructive pulmonary disease)   . Coronary artery disease   . Sleep apnea   . Hypertension   . High cholesterol   . MI (myocardial infarction) 2004  . GERD (gastroesophageal reflux disease)   . Family history of anesthesia complication     Daughter has nausea and vomiting  . Shortness of breath   . Cancer     lungs x 3 spots    ALLERGIES:  is allergic to taxol; plavix; and other.  MEDICATIONS:  Current Outpatient Prescriptions  Medication Sig Dispense Refill  . albuterol (PROVENTIL HFA;VENTOLIN HFA) 108 (90 BASE) MCG/ACT inhaler Inhale 1-2 puffs into the lungs every 6 (six) hours as needed for wheezing or  shortness of breath.      Marland Kitchen aspirin EC 81 MG tablet Take 81 mg by mouth at bedtime.      . metoprolol (LOPRESSOR) 100 MG tablet Take 100 mg by mouth at bedtime.      . prochlorperazine (COMPAZINE) 10 MG tablet Take 1 tablet (10 mg total) by mouth every 6 (six) hours as needed for nausea or vomiting.  60 tablet  0  . ranitidine (ZANTAC) 150 MG tablet Take 150 mg by mouth daily.      . simvastatin (ZOCOR) 40 MG tablet Take 40 mg by mouth every evening.      . tiotropium (SPIRIVA) 18 MCG inhalation capsule Place 18 mcg into inhaler and inhale daily.      . Wound Cleansers (RADIAPLEX EX) Apply topically.       No current facility-administered medications for this visit.    SURGICAL HISTORY:  Past Surgical History  Procedure Laterality Date  . Appendectomy    . Coronary angioplasty  2004    1 stent  . Anterior cruciate ligament repair Right   . Tonsillectomy    . Prostate biopsy N/A 06/13/2013    Procedure: ULTRASOUND GUIDED PROSTATE BIOPSY;  Surgeon: Marissa Nestle, MD;  Location: [facility];  Service: Urology;  Laterality: N/A;  . Video bronchoscopy with endobronchial navigation N/A 12/12/2013    Procedure: VIDEO BRONCHOSCOPY WITH ENDOBRONCHIAL NAVIGATION;  Surgeon: Melrose Nakayama, MD;  Location: [facility];  Service: Thoracic;  Laterality: N/A;  . Video bronchoscopy with endobronchial ultrasound N/A 12/12/2013    Procedure: VIDEO  BRONCHOSCOPY WITH ENDOBRONCHIAL ULTRASOUND;  Surgeon: Melrose Nakayama, MD;  Location: [facility];  Service: Thoracic;  Laterality: N/A;    REVIEW OF SYSTEMS:  A comprehensive review of systems was negative except for: Ears, nose, mouth, throat, and face: positive for sore throat Respiratory: positive for dyspnea on exertion   PHYSICAL EXAMINATION: General appearance: alert, cooperative and no distress Head: Normocephalic, without obvious abnormality, atraumatic Neck: no adenopathy, no JVD, supple, symmetrical, trachea midline and thyroid not enlarged, symmetric, no  tenderness/mass/nodules Lymph nodes: Cervical, supraclavicular, and axillary nodes normal. Resp: clear to auscultation bilaterally Back: symmetric, no curvature. ROM normal. No CVA tenderness. Cardio: regular rate and rhythm, S1, S2 normal, no murmur, click, rub or gallop GI: soft, non-tender; bowel sounds normal; no masses,  no organomegaly Extremities: extremities normal, atraumatic, no cyanosis or edema  ECOG PERFORMANCE STATUS: 1 - Symptomatic but completely ambulatory  Blood pressure 108/72, pulse 83, temperature 98.4 F (36.9 C), temperature source Oral, resp. rate 20, height 5\' 10"  (1.778 m), weight 266 lb 4.8 oz (120.793 kg).  LABORATORY DATA: Lab Results  Component Value Date   WBC 8.5 01/15/2014   HGB 15.9 01/15/2014   HCT 49.1 01/15/2014   MCV 85.7 01/15/2014   PLT 198 01/15/2014      Chemistry      Component Value Date/Time   NA 137 01/15/2014 0923   NA 141 12/12/2013 0626   K 5.0 01/15/2014 0923   K 4.6 12/12/2013 0626   CL 101 12/08/2013 1029   CO2 27 01/15/2014 0923   CO2 30 12/08/2013 1029   BUN 16.8 01/15/2014 0923   BUN 15 12/08/2013 1029   CREATININE 0.9 01/15/2014 0923   CREATININE 0.84 12/08/2013 1029      Component Value Date/Time   CALCIUM 9.5 01/15/2014 0923   CALCIUM 9.4 12/08/2013 1029   ALKPHOS 71 01/15/2014 0923   AST 11 01/15/2014 0923   ALT 15 01/15/2014 0923   BILITOT 0.39 01/15/2014 0923       RADIOGRAPHIC STUDIES:  ASSESSMENT AND PLAN: This is a very pleasant 63 years old white male recently diagnosed with a stage IIIa non-small cell lung cancer currently undergoing a course of concurrent chemoradiation with weekly carboplatin and paclitaxel is status post 3 cycle. He tolerated the first cycle of his treatment fairly well. Paclitaxel was discontinued secondary to hypersensitivity reaction at cycle #3. I recommended for the patient to proceed with cycle #4 as scheduled.  He would come back for followup visit in 2 weeks for evaluation and management any adverse effects of his  treatment. He was advised to call immediately if he has any concerning symptoms in the interval.  The patient voices understanding of current disease status and treatment options and is in agreement with the current care plan.  All questions were answered. The patient knows to call the clinic with any problems, questions or concerns. We can certainly see the patient much sooner if necessary.  Disclaimer: This note was dictated with voice recognition software. Similar sounding words can inadvertently be transcribed and may not be corrected upon review.

## 2014-01-15 NOTE — Patient Instructions (Signed)
[facility] Discharge Instructions for Patients Receiving Chemotherapy : Today you received the following chemotherapy agent: Carboplatin  To help prevent nausea and vomiting after your treatment, we encourage you to take your nausea medication as needed  Compazine 10 mg every 6 hours as needed   If you develop nausea and vomiting that is not controlled by your nausea medication, call the clinic.   BELOW ARE SYMPTOMS THAT SHOULD BE REPORTED IMMEDIATELY:  *FEVER GREATER THAN 100.5 F  *CHILLS WITH OR WITHOUT FEVER  NAUSEA AND VOMITING THAT IS NOT CONTROLLED WITH YOUR NAUSEA MEDICATION  *UNUSUAL SHORTNESS OF BREATH  *UNUSUAL BRUISING OR BLEEDING  TENDERNESS IN MOUTH AND THROAT WITH OR WITHOUT PRESENCE OF ULCERS  *URINARY PROBLEMS  *BOWEL PROBLEMS  UNUSUAL RASH Items with * indicate a potential emergency and should be followed up as soon as possible.  Feel free to call the clinic should you have any questions or concerns. The clinic phone number is [phone].  It has been a pleasure to serve you today!

## 2014-01-16 ENCOUNTER — Ambulatory Visit
Admission: RE | Admit: 2014-01-16 | Discharge: 2014-01-16 | Disposition: A | Discharge status: Home / Self Care | Payer: Medicare Other | Source: Ambulatory Visit | Attending: Radiation Oncology | Admitting: Radiation Oncology

## 2014-01-16 DIAGNOSIS — Z510 Encounter for antineoplastic radiation therapy: Secondary | ICD-10-CM | POA: Diagnosis not present

## 2014-01-17 ENCOUNTER — Ambulatory Visit
Admission: RE | Admit: 2014-01-17 | Discharge: 2014-01-17 | Disposition: A | Discharge status: Home / Self Care | Payer: Medicare Other | Source: Ambulatory Visit | Attending: Radiation Oncology | Admitting: Radiation Oncology

## 2014-01-17 DIAGNOSIS — Z510 Encounter for antineoplastic radiation therapy: Secondary | ICD-10-CM | POA: Diagnosis not present

## 2014-01-18 ENCOUNTER — Ambulatory Visit
Admission: RE | Admit: 2014-01-18 | Discharge: 2014-01-18 | Disposition: A | Discharge status: Home / Self Care | Payer: Medicare Other | Source: Ambulatory Visit | Attending: Radiation Oncology | Admitting: Radiation Oncology

## 2014-01-18 ENCOUNTER — Encounter: Payer: Self-pay | Admitting: Radiation Oncology

## 2014-01-18 VITALS — BP 107/73 | HR 78 | Resp 16 | Wt 268.6 lb

## 2014-01-18 DIAGNOSIS — Z510 Encounter for antineoplastic radiation therapy: Secondary | ICD-10-CM | POA: Diagnosis not present

## 2014-01-18 DIAGNOSIS — C3410 Malignant neoplasm of upper lobe, unspecified bronchus or lung: Secondary | ICD-10-CM

## 2014-01-18 NOTE — Progress Notes (Signed)
  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 . . . . . . . The patient presents for routine under treatment assessment.                                  Reports mild sore throat but, denies dysphagia. Reports dry cough continues. Denies shortness of breath. Reports hyperpigmentation of chest without desquamation. Patient denies this skin itches and he continues to use radiaplex as directed. Weight and vitals stable. Denies pain                                 Set-up films were reviewed.                                 The chart was checked. Physical Findings. . .  weight is 268 lb 9.6 oz (121.836 kg). His blood pressure is 107/73 and his pulse is 78. His respiration is 16. . Weight essentially stable.  No significant changes. Impression . . . . . . . The patient is tolerating radiation. Plan . . . . . . . . . . . . Continue treatment as planned.  ________________________________  Sheral Apley. Tammi Klippel, M.D.

## 2014-01-18 NOTE — Progress Notes (Signed)
Reports mild sore throat but, denies dysphagia. Reports dry cough continues. Denies shortness of breath. Reports hyperpigmentation of chest without desquamation. Patient denies this skin itches and he continues to use radiaplex as directed. Weight and vitals stable. Denies pain.

## 2014-01-19 ENCOUNTER — Ambulatory Visit
Admission: RE | Admit: 2014-01-19 | Discharge: 2014-01-19 | Disposition: A | Discharge status: Home / Self Care | Payer: Medicare Other | Source: Ambulatory Visit | Attending: Radiation Oncology | Admitting: Radiation Oncology

## 2014-01-19 DIAGNOSIS — Z510 Encounter for antineoplastic radiation therapy: Secondary | ICD-10-CM | POA: Diagnosis not present

## 2014-01-22 ENCOUNTER — Other Ambulatory Visit (HOSPITAL_BASED_OUTPATIENT_CLINIC_OR_DEPARTMENT_OTHER): Payer: Medicare Other

## 2014-01-22 ENCOUNTER — Ambulatory Visit
Admission: RE | Admit: 2014-01-22 | Discharge: 2014-01-22 | Disposition: A | Discharge status: Home / Self Care | Payer: Medicare Other | Source: Ambulatory Visit | Attending: Radiation Oncology | Admitting: Radiation Oncology

## 2014-01-22 ENCOUNTER — Ambulatory Visit (HOSPITAL_BASED_OUTPATIENT_CLINIC_OR_DEPARTMENT_OTHER): Payer: Medicare Other

## 2014-01-22 DIAGNOSIS — C3410 Malignant neoplasm of upper lobe, unspecified bronchus or lung: Secondary | ICD-10-CM

## 2014-01-22 DIAGNOSIS — Z5111 Encounter for antineoplastic chemotherapy: Secondary | ICD-10-CM

## 2014-01-22 DIAGNOSIS — Z510 Encounter for antineoplastic radiation therapy: Secondary | ICD-10-CM | POA: Diagnosis not present

## 2014-01-22 DIAGNOSIS — C3491 Malignant neoplasm of unspecified part of right bronchus or lung: Secondary | ICD-10-CM

## 2014-01-22 LAB — COMPREHENSIVE METABOLIC PANEL (CC13)
ALK PHOS: 67 U/L (ref 40–150)
ALT: 11 U/L (ref 0–55)
AST: 9 U/L (ref 5–34)
Albumin: 2.8 g/dL — ABNORMAL LOW (ref 3.5–5.0)
Anion Gap: 7 mEq/L (ref 3–11)
BILIRUBIN TOTAL: 0.37 mg/dL (ref 0.20–1.20)
BUN: 16.3 mg/dL (ref 7.0–26.0)
CO2: 30 mEq/L — ABNORMAL HIGH (ref 22–29)
CREATININE: 0.8 mg/dL (ref 0.7–1.3)
Calcium: 9.2 mg/dL (ref 8.4–10.4)
Chloride: 101 mEq/L (ref 98–109)
Glucose: 113 mg/dl (ref 70–140)
Potassium: 4.6 mEq/L (ref 3.5–5.1)
Sodium: 138 mEq/L (ref 136–145)
Total Protein: 6.7 g/dL (ref 6.4–8.3)

## 2014-01-22 LAB — CBC WITH DIFFERENTIAL/PLATELET
BASO%: 1.1 % (ref 0.0–2.0)
Basophils Absolute: 0.1 10*3/uL (ref 0.0–0.1)
EOS%: 2.7 % (ref 0.0–7.0)
Eosinophils Absolute: 0.2 10*3/uL (ref 0.0–0.5)
HEMATOCRIT: 43.4 % (ref 38.4–49.9)
HGB: 14.1 g/dL (ref 13.0–17.1)
LYMPH#: 0.5 10*3/uL — AB (ref 0.9–3.3)
LYMPH%: 8 % — ABNORMAL LOW (ref 14.0–49.0)
MCH: 27.7 pg (ref 27.2–33.4)
MCHC: 32.4 g/dL (ref 32.0–36.0)
MCV: 85.3 fL (ref 79.3–98.0)
MONO#: 0.5 10*3/uL (ref 0.1–0.9)
MONO%: 7.6 % (ref 0.0–14.0)
NEUT#: 4.8 10*3/uL (ref 1.5–6.5)
NEUT%: 80.6 % — AB (ref 39.0–75.0)
Platelets: 127 10*3/uL — ABNORMAL LOW (ref 140–400)
RBC: 5.09 10*6/uL (ref 4.20–5.82)
RDW: 14.2 % (ref 11.0–14.6)
WBC: 6 10*3/uL (ref 4.0–10.3)

## 2014-01-22 MED ORDER — CARBOPLATIN CHEMO INJECTION 450 MG/45ML
300.0000 mg | Freq: Once | INTRAVENOUS | Status: AC
  Administered 2014-01-22: 300 mg via INTRAVENOUS
  Filled 2014-01-22: qty 30

## 2014-01-22 MED ORDER — ONDANSETRON 16 MG/50ML IVPB (CHCC)
INTRAVENOUS | Status: AC
  Filled 2014-01-22: qty 16

## 2014-01-22 MED ORDER — ONDANSETRON 16 MG/50ML IVPB (CHCC)
16.0000 mg | Freq: Once | INTRAVENOUS | Status: AC
  Administered 2014-01-22: 16 mg via INTRAVENOUS

## 2014-01-22 MED ORDER — SODIUM CHLORIDE 0.9 % IV SOLN
Freq: Once | INTRAVENOUS | Status: AC
  Administered 2014-01-22: 13:00:00 via INTRAVENOUS

## 2014-01-22 MED ORDER — DEXAMETHASONE SODIUM PHOSPHATE 20 MG/5ML IJ SOLN
INTRAMUSCULAR | Status: AC
  Filled 2014-01-22: qty 5

## 2014-01-22 MED ORDER — DEXAMETHASONE SODIUM PHOSPHATE 20 MG/5ML IJ SOLN
20.0000 mg | Freq: Once | INTRAMUSCULAR | Status: AC
  Administered 2014-01-22: 20 mg via INTRAVENOUS

## 2014-01-22 NOTE — Patient Instructions (Signed)
Carboplatin injection What is this medicine? CARBOPLATIN (KAR boe pla tin) is a chemotherapy drug. It targets fast dividing cells, like cancer cells, and causes these cells to die. This medicine is used to treat ovarian cancer and many other cancers. This medicine may be used for other purposes; ask your health care provider or pharmacist if you have questions. COMMON BRAND NAME(S): Paraplatin What should I tell my health care provider before I take this medicine? They need to know if you have any of these conditions: -blood disorders -hearing problems -kidney disease -recent or ongoing radiation therapy -an unusual or allergic reaction to carboplatin, cisplatin, other chemotherapy, other medicines, foods, dyes, or preservatives -pregnant or trying to get pregnant -breast-feeding How should I use this medicine? This drug is usually given as an infusion into a vein. It is administered in a hospital or clinic by a specially trained health care professional. Talk to your pediatrician regarding the use of this medicine in children. Special care may be needed. Overdosage: If you think you have taken too much of this medicine contact a poison control center or emergency room at once. NOTE: This medicine is only for you. Do not share this medicine with others. What if I miss a dose? It is important not to miss a dose. Call your doctor or health care professional if you are unable to keep an appointment. What may interact with this medicine? -medicines for seizures -medicines to increase blood counts like filgrastim, pegfilgrastim, sargramostim -some antibiotics like amikacin, gentamicin, neomycin, streptomycin, tobramycin -vaccines Talk to your doctor or health care professional before taking any of these medicines: -acetaminophen -aspirin -ibuprofen -ketoprofen -naproxen This list may not describe all possible interactions. Give your health care provider a list of all the medicines, herbs,  non-prescription drugs, or dietary supplements you use. Also tell them if you smoke, drink alcohol, or use illegal drugs. Some items may interact with your medicine. What should I watch for while using this medicine? Your condition will be monitored carefully while you are receiving this medicine. You will need important blood work done while you are taking this medicine. This drug may make you feel generally unwell. This is not uncommon, as chemotherapy can affect healthy cells as well as cancer cells. Report any side effects. Continue your course of treatment even though you feel ill unless your doctor tells you to stop. In some cases, you may be given additional medicines to help with side effects. Follow all directions for their use. Call your doctor or health care professional for advice if you get a fever, chills or sore throat, or other symptoms of a cold or flu. Do not treat yourself. This drug decreases your body's ability to fight infections. Try to avoid being around people who are sick. This medicine may increase your risk to bruise or bleed. Call your doctor or health care professional if you notice any unusual bleeding. Be careful brushing and flossing your teeth or using a toothpick because you may get an infection or bleed more easily. If you have any dental work done, tell your dentist you are receiving this medicine. Avoid taking products that contain aspirin, acetaminophen, ibuprofen, naproxen, or ketoprofen unless instructed by your doctor. These medicines may hide a fever. Do not become pregnant while taking this medicine. Women should inform their doctor if they wish to become pregnant or think they might be pregnant. There is a potential for serious side effects to an unborn child. Talk to your health care professional or  pharmacist for more information. Do not breast-feed an infant while taking this medicine. What side effects may I notice from receiving this medicine? Side effects  that you should report to your doctor or health care professional as soon as possible: -allergic reactions like skin rash, itching or hives, swelling of the face, lips, or tongue -signs of infection - fever or chills, cough, sore throat, pain or difficulty passing urine -signs of decreased platelets or bleeding - bruising, pinpoint red spots on the skin, black, tarry stools, nosebleeds -signs of decreased red blood cells - unusually weak or tired, fainting spells, lightheadedness -breathing problems -changes in hearing -changes in vision -chest pain -high blood pressure -low blood counts - This drug may decrease the number of white blood cells, red blood cells and platelets. You may be at increased risk for infections and bleeding. -nausea and vomiting -pain, swelling, redness or irritation at the injection site -pain, tingling, numbness in the hands or feet -problems with balance, talking, walking -trouble passing urine or change in the amount of urine Side effects that usually do not require medical attention (report to your doctor or health care professional if they continue or are bothersome): -hair loss -loss of appetite -metallic taste in the mouth or changes in taste This list may not describe all possible side effects. Call your doctor for medical advice about side effects. You may report side effects to FDA at 1-800-FDA-1088. Where should I keep my medicine? This drug is given in a hospital or clinic and will not be stored at home. NOTE: This sheet is a summary. It may not cover all possible information. If you have questions about this medicine, talk to your doctor, pharmacist, or health care provider.  2014, [employer]. (2007-11-01 14:38:05)

## 2014-01-23 ENCOUNTER — Ambulatory Visit
Admission: RE | Admit: 2014-01-23 | Discharge: 2014-01-23 | Disposition: A | Discharge status: Home / Self Care | Payer: Medicare Other | Source: Ambulatory Visit | Attending: Radiation Oncology | Admitting: Radiation Oncology

## 2014-01-23 DIAGNOSIS — Z510 Encounter for antineoplastic radiation therapy: Secondary | ICD-10-CM | POA: Diagnosis not present

## 2014-01-24 ENCOUNTER — Encounter: Payer: Self-pay | Admitting: Radiation Oncology

## 2014-01-24 ENCOUNTER — Ambulatory Visit
Admission: RE | Admit: 2014-01-24 | Discharge: 2014-01-24 | Disposition: A | Discharge status: Home / Self Care | Payer: Medicare Other | Source: Ambulatory Visit | Attending: Radiation Oncology | Admitting: Radiation Oncology

## 2014-01-24 DIAGNOSIS — Z510 Encounter for antineoplastic radiation therapy: Secondary | ICD-10-CM | POA: Diagnosis not present

## 2014-01-24 NOTE — Progress Notes (Signed)
  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 . . . . . . . The patient presents for routine under treatment assessment.                                  Reports mild discomfort when he swallows his food. Weight stable. Denies pain. Vitals stable. Mild hyperpigmentation without desquamation within treatment field noted. Reports he has only used radiaplex once or twice. Encouraged patient to use radiaplex bid. Reports dry cough continues. Denise shortness of breath                                 Set-up films were reviewed.                                 The chart was checked. Physical Findings. . .  weight is 270 lb 9.6 oz (122.743 kg). His blood pressure is 129/74 and his pulse is 70. His respiration is 18 and oxygen saturation is 96%. . Weight essentially stable.  No significant changes. Impression . . . . . . . The patient is tolerating radiation. Plan . . . . . . . . . . . . Continue treatment as planned.  ________________________________  Sheral Apley. Tammi Klippel, M.D.

## 2014-01-25 ENCOUNTER — Ambulatory Visit
Admission: RE | Admit: 2014-01-25 | Discharge: 2014-01-25 | Disposition: A | Discharge status: Home / Self Care | Payer: Medicare Other | Source: Ambulatory Visit | Attending: Radiation Oncology | Admitting: Radiation Oncology

## 2014-01-25 ENCOUNTER — Encounter: Payer: Self-pay | Admitting: Radiation Oncology

## 2014-01-25 VITALS — BP 129/74 | HR 70 | Resp 18 | Wt 270.6 lb

## 2014-01-25 DIAGNOSIS — C3410 Malignant neoplasm of upper lobe, unspecified bronchus or lung: Secondary | ICD-10-CM

## 2014-01-25 DIAGNOSIS — Z510 Encounter for antineoplastic radiation therapy: Secondary | ICD-10-CM | POA: Diagnosis not present

## 2014-01-25 NOTE — Progress Notes (Signed)
Reports mild discomfort when he swallows his food. Weight stable. Denies pain. Vitals stable. Mild hyperpigmentation without desquamation within treatment field noted. Reports he has only used radiaplex once or twice. Encouraged patient to use radiaplex bid. Reports dry cough continues. Denise shortness of breath.

## 2014-01-26 ENCOUNTER — Ambulatory Visit (HOSPITAL_BASED_OUTPATIENT_CLINIC_OR_DEPARTMENT_OTHER)
Admission: RE | Admit: 2014-01-26 | Discharge: 2014-01-26 | Disposition: A | Discharge status: Home / Self Care | Payer: Medicare Other | Source: Ambulatory Visit | Attending: Radiation Oncology | Admitting: Radiation Oncology

## 2014-01-26 DIAGNOSIS — Z510 Encounter for antineoplastic radiation therapy: Secondary | ICD-10-CM | POA: Diagnosis not present

## 2014-01-29 ENCOUNTER — Telehealth: Payer: Self-pay | Admitting: Internal Medicine

## 2014-01-29 ENCOUNTER — Encounter: Payer: Self-pay | Admitting: Internal Medicine

## 2014-01-29 ENCOUNTER — Ambulatory Visit
Admission: RE | Admit: 2014-01-29 | Discharge: 2014-01-29 | Disposition: A | Discharge status: Home / Self Care | Payer: Medicare Other | Source: Ambulatory Visit | Attending: Radiation Oncology | Admitting: Radiation Oncology

## 2014-01-29 ENCOUNTER — Ambulatory Visit (HOSPITAL_BASED_OUTPATIENT_CLINIC_OR_DEPARTMENT_OTHER): Payer: Medicare Other | Admitting: Internal Medicine

## 2014-01-29 ENCOUNTER — Other Ambulatory Visit: Payer: Medicare Other

## 2014-01-29 ENCOUNTER — Ambulatory Visit (HOSPITAL_BASED_OUTPATIENT_CLINIC_OR_DEPARTMENT_OTHER): Payer: Medicare Other

## 2014-01-29 VITALS — BP 121/75 | HR 78 | Temp 98.1°F | Resp 18 | Ht 70.0 in | Wt 264.7 lb

## 2014-01-29 DIAGNOSIS — C3410 Malignant neoplasm of upper lobe, unspecified bronchus or lung: Secondary | ICD-10-CM

## 2014-01-29 DIAGNOSIS — C3491 Malignant neoplasm of unspecified part of right bronchus or lung: Secondary | ICD-10-CM

## 2014-01-29 DIAGNOSIS — Z5111 Encounter for antineoplastic chemotherapy: Secondary | ICD-10-CM

## 2014-01-29 DIAGNOSIS — Z510 Encounter for antineoplastic radiation therapy: Secondary | ICD-10-CM | POA: Diagnosis not present

## 2014-01-29 LAB — COMPREHENSIVE METABOLIC PANEL (CC13)
ALBUMIN: 3 g/dL — AB (ref 3.5–5.0)
ALT: 12 U/L (ref 0–55)
ANION GAP: 7 meq/L (ref 3–11)
AST: 10 U/L (ref 5–34)
Alkaline Phosphatase: 63 U/L (ref 40–150)
BUN: 19.9 mg/dL (ref 7.0–26.0)
CO2: 28 meq/L (ref 22–29)
CREATININE: 0.8 mg/dL (ref 0.7–1.3)
Calcium: 9.7 mg/dL (ref 8.4–10.4)
Chloride: 103 mEq/L (ref 98–109)
Glucose: 119 mg/dl (ref 70–140)
POTASSIUM: 4.7 meq/L (ref 3.5–5.1)
Sodium: 138 mEq/L (ref 136–145)
Total Bilirubin: 0.52 mg/dL (ref 0.20–1.20)
Total Protein: 6.9 g/dL (ref 6.4–8.3)

## 2014-01-29 LAB — CBC WITH DIFFERENTIAL/PLATELET
BASO%: 0.7 % (ref 0.0–2.0)
BASOS ABS: 0 10*3/uL (ref 0.0–0.1)
EOS%: 2.5 % (ref 0.0–7.0)
Eosinophils Absolute: 0.1 10*3/uL (ref 0.0–0.5)
HEMATOCRIT: 44.5 % (ref 38.4–49.9)
HEMOGLOBIN: 14.5 g/dL (ref 13.0–17.1)
LYMPH#: 0.3 10*3/uL — AB (ref 0.9–3.3)
LYMPH%: 5.8 % — ABNORMAL LOW (ref 14.0–49.0)
MCH: 27.6 pg (ref 27.2–33.4)
MCHC: 32.6 g/dL (ref 32.0–36.0)
MCV: 84.8 fL (ref 79.3–98.0)
MONO#: 0.3 10*3/uL (ref 0.1–0.9)
MONO%: 4.9 % (ref 0.0–14.0)
NEUT#: 4.5 10*3/uL (ref 1.5–6.5)
NEUT%: 86.1 % — ABNORMAL HIGH (ref 39.0–75.0)
Platelets: 76 10*3/uL — ABNORMAL LOW (ref 140–400)
RBC: 5.25 10*6/uL (ref 4.20–5.82)
RDW: 14.3 % (ref 11.0–14.6)
WBC: 5.2 10*3/uL (ref 4.0–10.3)

## 2014-01-29 MED ORDER — DEXAMETHASONE SODIUM PHOSPHATE 20 MG/5ML IJ SOLN
20.0000 mg | Freq: Once | INTRAMUSCULAR | Status: AC
  Administered 2014-01-29: 20 mg via INTRAVENOUS

## 2014-01-29 MED ORDER — ONDANSETRON 16 MG/50ML IVPB (CHCC)
16.0000 mg | Freq: Once | INTRAVENOUS | Status: AC
  Administered 2014-01-29: 16 mg via INTRAVENOUS

## 2014-01-29 MED ORDER — SODIUM CHLORIDE 0.9 % IV SOLN
Freq: Once | INTRAVENOUS | Status: AC
  Administered 2014-01-29: 10:00:00 via INTRAVENOUS

## 2014-01-29 MED ORDER — DEXAMETHASONE SODIUM PHOSPHATE 20 MG/5ML IJ SOLN
INTRAMUSCULAR | Status: AC
  Filled 2014-01-29: qty 5

## 2014-01-29 MED ORDER — SODIUM CHLORIDE 0.9 % IV SOLN
300.0000 mg | Freq: Once | INTRAVENOUS | Status: AC
  Administered 2014-01-29: 300 mg via INTRAVENOUS
  Filled 2014-01-29: qty 30

## 2014-01-29 MED ORDER — ONDANSETRON 16 MG/50ML IVPB (CHCC)
INTRAVENOUS | Status: AC
  Filled 2014-01-29: qty 16

## 2014-01-29 NOTE — Telephone Encounter (Signed)
Gave pt appt for lab,md and chemo for June,june and August

## 2014-01-29 NOTE — Progress Notes (Signed)
[facility] [phone]   [phone]  OFFICE PROGRESS NOTE  Alonza Bogus, MD [place] [address]   DIAGNOSIS: Stage IIIA (T2a, N2, M0) non-small cell lung cancer consistent with adenocarcinoma diagnosed in May of 2014, presented with right upper lobe lung mass in addition to mediastinal lymphadenopathy.  PRIOR THERAPY:  None.  CURRENT THERAPY: Concurrent chemoradiation with weekly carboplatin for AUC of 2 and paclitaxel 45 mg/M2, status post 3 cycle. Paclitaxel was discontinued at cycle #3 secondary to hypersensitivity reaction.   INTERVAL HISTORY: Carlos Goodwin 63 y.o. male returns to the clinic today for followup visit accompanied by his wife. He is currently on concurrent chemoradiation with single agent carboplatin after he developed hypersensitivity reaction to paclitaxel. He is tolerating his treatment well with no significant adverse effects except for mild dysphasia. He is expected to complete this course of concurrent chemoradiation on 02/08/2014. He denied having any significant fever or chills, no nausea or vomiting. He has no significant chest pain but continues to have shortness of breath with exertion with mild cough with no hemoptysis. His weight has been stable.  MEDICAL HISTORY: Past Medical History  Diagnosis Date  . COPD (chronic obstructive pulmonary disease)   . Coronary artery disease   . Sleep apnea   . Hypertension   . High cholesterol   . MI (myocardial infarction) 2004  . GERD (gastroesophageal reflux disease)   . Family history of anesthesia complication     Daughter has nausea and vomiting  . Shortness of breath   . Cancer     lungs x 3 spots    ALLERGIES:  is allergic to taxol; plavix; and other.  MEDICATIONS:  Current Outpatient Prescriptions  Medication Sig Dispense Refill  . albuterol (PROVENTIL HFA;VENTOLIN HFA) 108 (90 BASE) MCG/ACT inhaler Inhale 1-2 puffs into the  lungs every 6 (six) hours as needed for wheezing or shortness of breath.      Marland Kitchen aspirin EC 81 MG tablet Take 81 mg by mouth at bedtime.      . metoprolol (LOPRESSOR) 100 MG tablet Take 100 mg by mouth at bedtime.      . prochlorperazine (COMPAZINE) 10 MG tablet Take 1 tablet (10 mg total) by mouth every 6 (six) hours as needed for nausea or vomiting.  60 tablet  0  . ranitidine (ZANTAC) 150 MG tablet Take 150 mg by mouth daily.      . simvastatin (ZOCOR) 40 MG tablet Take 40 mg by mouth every evening.      . tiotropium (SPIRIVA) 18 MCG inhalation capsule Place 18 mcg into inhaler and inhale daily.      . Wound Cleansers (RADIAPLEX EX) Apply topically.       No current facility-administered medications for this visit.    SURGICAL HISTORY:  Past Surgical History  Procedure Laterality Date  . Appendectomy    . Coronary angioplasty  2004    1 stent  . Anterior cruciate ligament repair Right   . Tonsillectomy    . Prostate biopsy N/A 06/13/2013    Procedure: ULTRASOUND GUIDED PROSTATE BIOPSY;  Surgeon: Marissa Nestle, MD;  Location: [facility];  Service: Urology;  Laterality: N/A;  . Video bronchoscopy with endobronchial navigation N/A 12/12/2013    Procedure: VIDEO BRONCHOSCOPY WITH ENDOBRONCHIAL NAVIGATION;  Surgeon: Melrose Nakayama, MD;  Location: [facility];  Service: Thoracic;  Laterality: N/A;  . Video bronchoscopy with endobronchial ultrasound N/A 12/12/2013  Procedure: VIDEO BRONCHOSCOPY WITH ENDOBRONCHIAL ULTRASOUND;  Surgeon: Melrose Nakayama, MD;  Location: [facility];  Service: Thoracic;  Laterality: N/A;    REVIEW OF SYSTEMS:  A comprehensive review of systems was negative except for: Ears, nose, mouth, throat, and face: positive for sore throat Respiratory: positive for dyspnea on exertion   PHYSICAL EXAMINATION: General appearance: alert, cooperative and no distress Head: Normocephalic, without obvious abnormality, atraumatic Neck: no adenopathy, no JVD, supple, symmetrical,  trachea midline and thyroid not enlarged, symmetric, no tenderness/mass/nodules Lymph nodes: Cervical, supraclavicular, and axillary nodes normal. Resp: clear to auscultation bilaterally Back: symmetric, no curvature. ROM normal. No CVA tenderness. Cardio: regular rate and rhythm, S1, S2 normal, no murmur, click, rub or gallop GI: soft, non-tender; bowel sounds normal; no masses,  no organomegaly Extremities: extremities normal, atraumatic, no cyanosis or edema  ECOG PERFORMANCE STATUS: 1 - Symptomatic but completely ambulatory  Blood pressure 121/75, pulse 78, temperature 98.1 F (36.7 C), temperature source Oral, resp. rate 18, height 5\' 10"  (1.778 m), weight 264 lb 11.2 oz (120.067 kg), SpO2 100.00%.  LABORATORY DATA: Lab Results  Component Value Date   WBC 6.0 01/22/2014   HGB 14.1 01/22/2014   HCT 43.4 01/22/2014   MCV 85.3 01/22/2014   PLT 127* 01/22/2014      Chemistry      Component Value Date/Time   NA 138 01/22/2014 1047   NA 141 12/12/2013 0626   K 4.6 01/22/2014 1047   K 4.6 12/12/2013 0626   CL 101 12/08/2013 1029   CO2 30* 01/22/2014 1047   CO2 30 12/08/2013 1029   BUN 16.3 01/22/2014 1047   BUN 15 12/08/2013 1029   CREATININE 0.8 01/22/2014 1047   CREATININE 0.84 12/08/2013 1029      Component Value Date/Time   CALCIUM 9.2 01/22/2014 1047   CALCIUM 9.4 12/08/2013 1029   ALKPHOS 67 01/22/2014 1047   AST 9 01/22/2014 1047   ALT 11 01/22/2014 1047   BILITOT 0.37 01/22/2014 1047       RADIOGRAPHIC STUDIES:  ASSESSMENT AND PLAN: This is a very pleasant 63 years old white male recently diagnosed with a stage IIIa non-small cell lung cancer currently undergoing a course of concurrent chemoradiation with weekly carboplatin and paclitaxel is status post 5 cycle. He tolerated the first cycle of his treatment fairly well. Paclitaxel was discontinued secondary to hypersensitivity reaction at cycle #3. I recommended for the patient to proceed with cycle #6 as scheduled.  He would come back  for followup visit in 6 weeks after repeating CT scan of the chest for restaging of his disease. He was advised to call immediately if he has any concerning symptoms in the interval.  The patient voices understanding of current disease status and treatment options and is in agreement with the current care plan.  All questions were answered. The patient knows to call the clinic with any problems, questions or concerns. We can certainly see the patient much sooner if necessary.  Disclaimer: This note was dictated with voice recognition software. Similar sounding words can inadvertently be transcribed and may not be corrected upon review.

## 2014-01-29 NOTE — Patient Instructions (Signed)
Smoking Cessation, Tips for Success If you are ready to quit smoking, congratulations! You have chosen to help yourself be healthier. Cigarettes bring nicotine, tar, carbon monoxide, and other irritants into your body. Your lungs, heart, and blood vessels will be able to work better without these poisons. There are many different ways to quit smoking. Nicotine gum, nicotine patches, a nicotine inhaler, or nicotine nasal spray can help with physical craving. Hypnosis, support groups, and medicines help break the habit of smoking. WHAT THINGS CAN I DO TO MAKE QUITTING EASIER?  Here are some tips to help you quit for good:  Pick a date when you will quit smoking completely. Tell all of your friends and family about your plan to quit on that date.  Do not try to slowly cut down on the number of cigarettes you are smoking. Pick a quit date and quit smoking completely starting on that day.  Throw away all cigarettes.   Clean and remove all ashtrays from your home, work, and car.   On a card, write down your reasons for quitting. Carry the card with you and read it when you get the urge to smoke.   Cleanse your body of nicotine. Drink enough water and fluids to keep your urine clear or pale yellow. Do this after quitting to flush the nicotine from your body.   Learn to predict your moods. Do not let a bad situation be your excuse to have a cigarette. Some situations in your life might tempt you into wanting a cigarette.   Never have "just one" cigarette. It leads to wanting another and another. Remind yourself of your decision to quit.   Change habits associated with smoking. If you smoked while driving or when feeling stressed, try other activities to replace smoking. Stand up when drinking your coffee. Brush your teeth after eating. Sit in a different chair when you read the paper. Avoid alcohol while trying to quit, and try to drink fewer caffeinated beverages. Alcohol and caffeine may urge  you to smoke.   Avoid foods and drinks that can trigger a desire to smoke, such as sugary or spicy foods and alcohol.   Ask people who smoke not to smoke around you.   Have something planned to do right after eating or having a cup of coffee. For example, plan to take a walk or exercise.   Try a relaxation exercise to calm you down and decrease your stress. Remember, you may be tense and nervous for the first 2 weeks after you quit, but this will pass.   Find new activities to keep your hands busy. Play with a pen, coin, or rubber band. Doodle or draw things on paper.   Brush your teeth right after eating. This will help cut down on the craving for the taste of tobacco after meals. You can also try mouthwash.   Use oral substitutes in place of cigarettes. Try using lemon drops, carrots, cinnamon sticks, or chewing gum. Keep them handy so they are available when you have the urge to smoke.   When you have the urge to smoke, try deep breathing.   Designate your home as a nonsmoking area.   If you are a heavy smoker, ask your health care provider about a prescription for nicotine chewing gum. It can ease your withdrawal from nicotine.   Reward yourself. Set aside the cigarette money you save and buy yourself something nice.   Look for support from others. Join a support group or   smoking cessation program. Ask someone at home or at work to help you with your plan to quit smoking.   Always ask yourself, "Do I need this cigarette or is this just a reflex?" Tell yourself, "Today, I choose not to smoke," or "I do not want to smoke." You are reminding yourself of your decision to quit.  Do not replace cigarette smoking with electronic cigarettes (commonly called e-cigarettes). The safety of e-cigarettes is unknown, and some may contain harmful chemicals.  If you relapse, do not give up! Plan ahead and think about what you will do the next time you get the urge to smoke.  HOW WILL  I FEEL WHEN I QUIT SMOKING? You may have symptoms of withdrawal because your body is used to nicotine (the addictive substance in cigarettes). You may crave cigarettes, be irritable, feel very hungry, cough often, get headaches, or have difficulty concentrating. The withdrawal symptoms are only temporary. They are strongest when you first quit but will go away within 10-14 days. When withdrawal symptoms occur, stay in control. Think about your reasons for quitting. Remind yourself that these are signs that your body is healing and getting used to being without cigarettes. Remember that withdrawal symptoms are easier to treat than the major diseases that smoking can cause.  Even after the withdrawal is over, expect periodic urges to smoke. However, these cravings are generally short lived and will go away whether you smoke or not. Do not smoke!  WHAT RESOURCES ARE AVAILABLE TO HELP ME QUIT SMOKING? Your health care provider can direct you to community resources or hospitals for support, which may include:  Group support.  Education.  Hypnosis.  Therapy. Document Released: 04/24/2004 Document Revised: 05/17/2013 Document Reviewed: 01/12/2013 [facility] Patient Information 2015 [place], [place]. This information is not intended to replace advice given to you by your health care provider. Make sure you discuss any questions you have with your health care provider.

## 2014-01-29 NOTE — Patient Instructions (Signed)
[facility] Discharge Instructions for Patients Receiving Chemotherapy  Today you received the following chemotherapy agents carboplatin.    To help prevent nausea and vomiting after your treatment, we encourage you to take your nausea medication as directed.     If you develop nausea and vomiting that is not controlled by your nausea medication, call the clinic.   BELOW ARE SYMPTOMS THAT SHOULD BE REPORTED IMMEDIATELY:  *FEVER GREATER THAN 100.5 F  *CHILLS WITH OR WITHOUT FEVER  NAUSEA AND VOMITING THAT IS NOT CONTROLLED WITH YOUR NAUSEA MEDICATION  *UNUSUAL SHORTNESS OF BREATH  *UNUSUAL BRUISING OR BLEEDING  TENDERNESS IN MOUTH AND THROAT WITH OR WITHOUT PRESENCE OF ULCERS  *URINARY PROBLEMS  *BOWEL PROBLEMS  UNUSUAL RASH Items with * indicate a potential emergency and should be followed up as soon as possible.  Feel free to call the clinic you have any questions or concerns. The clinic phone number is [phone].

## 2014-01-29 NOTE — Progress Notes (Signed)
Per Dr Julien Nordmann it is okay to treat pt today with chemotherapy and CBC /diff results from today.

## 2014-01-30 ENCOUNTER — Ambulatory Visit
Admission: RE | Admit: 2014-01-30 | Discharge: 2014-01-30 | Disposition: A | Discharge status: Home / Self Care | Payer: Medicare Other | Source: Ambulatory Visit | Attending: Radiation Oncology | Admitting: Radiation Oncology

## 2014-01-30 DIAGNOSIS — Z510 Encounter for antineoplastic radiation therapy: Secondary | ICD-10-CM | POA: Diagnosis not present

## 2014-01-31 ENCOUNTER — Ambulatory Visit: Payer: Medicare Other | Admitting: Radiation Oncology

## 2014-01-31 ENCOUNTER — Ambulatory Visit: Payer: Medicare Other

## 2014-02-01 ENCOUNTER — Ambulatory Visit
Admission: RE | Admit: 2014-02-01 | Discharge: 2014-02-01 | Disposition: A | Discharge status: Home / Self Care | Payer: Medicare Other | Source: Ambulatory Visit | Attending: Radiation Oncology | Admitting: Radiation Oncology

## 2014-02-01 DIAGNOSIS — Z510 Encounter for antineoplastic radiation therapy: Secondary | ICD-10-CM | POA: Diagnosis not present

## 2014-02-02 ENCOUNTER — Encounter: Payer: Self-pay | Admitting: Radiation Oncology

## 2014-02-02 ENCOUNTER — Ambulatory Visit: Payer: Medicare Other | Admitting: Radiation Oncology

## 2014-02-02 ENCOUNTER — Ambulatory Visit
Admission: RE | Admit: 2014-02-02 | Discharge: 2014-02-02 | Disposition: A | Discharge status: Home / Self Care | Payer: Medicare Other | Source: Ambulatory Visit | Attending: Radiation Oncology | Admitting: Radiation Oncology

## 2014-02-02 VITALS — BP 135/86 | HR 70 | Resp 16 | Wt 269.4 lb

## 2014-02-02 DIAGNOSIS — C3410 Malignant neoplasm of upper lobe, unspecified bronchus or lung: Secondary | ICD-10-CM

## 2014-02-02 DIAGNOSIS — Z510 Encounter for antineoplastic radiation therapy: Secondary | ICD-10-CM | POA: Diagnosis not present

## 2014-02-02 NOTE — Progress Notes (Signed)
Reports mild discomfort when he swallows his food. Weight stable. Denies pain. Vitals stable. Mild hyperpigmentation without desquamation within treatment field noted. Reports he has only used radiaplex once or twice. Encouraged patient to use radiaplex bid. Reports dry cough continues. Denise shortness of breath.

## 2014-02-02 NOTE — Progress Notes (Signed)
   Department of Radiation Oncology  Phone:  [phone] Fax:        [phone]  Weekly Treatment Note    Name: Carlos Goodwin Date: 02/02/2014 MRN: [id number] DOB: 1951/07/19   Current dose: 56 Gy  Current fraction: 28   MEDICATIONS: Current Outpatient Prescriptions  Medication Sig Dispense Refill  . albuterol (PROVENTIL HFA;VENTOLIN HFA) 108 (90 BASE) MCG/ACT inhaler Inhale 1-2 puffs into the lungs every 6 (six) hours as needed for wheezing or shortness of breath.      Marland Kitchen aspirin EC 81 MG tablet Take 81 mg by mouth at bedtime.      . metoprolol (LOPRESSOR) 100 MG tablet Take 100 mg by mouth at bedtime.      . prochlorperazine (COMPAZINE) 10 MG tablet Take 1 tablet (10 mg total) by mouth every 6 (six) hours as needed for nausea or vomiting.  60 tablet  0  . ranitidine (ZANTAC) 150 MG tablet Take 150 mg by mouth daily.      . simvastatin (ZOCOR) 40 MG tablet Take 40 mg by mouth every evening.      . tiotropium (SPIRIVA) 18 MCG inhalation capsule Place 18 mcg into inhaler and inhale daily.      . Wound Cleansers (RADIAPLEX EX) Apply topically.       No current facility-administered medications for this encounter.     ALLERGIES: Taxol; Plavix; and Other   LABORATORY DATA:  Lab Results  Component Value Date   WBC 5.2 01/29/2014   HGB 14.5 01/29/2014   HCT 44.5 01/29/2014   MCV 84.8 01/29/2014   PLT 76 repeated and verified* 01/29/2014   Lab Results  Component Value Date   NA 138 01/29/2014   K 4.7 01/29/2014   CL 101 12/08/2013   CO2 28 01/29/2014   Lab Results  Component Value Date   ALT 12 01/29/2014   AST 10 01/29/2014   ALKPHOS 63 01/29/2014   BILITOT 0.52 01/29/2014     NARRATIVE: Carlos Goodwin was seen today for weekly treatment management. The chart was checked and the patient's films were reviewed. The patient states he is doing very well. He has had some esophagitis but he states that this has been very mild. He indicates his skin is doing well without any  discomfort or significant irritation. The patient has discussed possible medication previously for esophagitis but he continues to believe that he does not require anything at this time. Overall he is very pleased with how his treatment has gone so far.  PHYSICAL EXAMINATION: weight is 269 lb 6.4 oz (122.199 kg). His blood pressure is 135/86 and his pulse is 70. His respiration is 16 and oxygen saturation is 98%.        ASSESSMENT: The patient is doing satisfactorily with treatment.  PLAN: We will continue with the patient's radiation treatment as planned.

## 2014-02-05 ENCOUNTER — Ambulatory Visit (HOSPITAL_BASED_OUTPATIENT_CLINIC_OR_DEPARTMENT_OTHER): Payer: Medicare Other

## 2014-02-05 ENCOUNTER — Other Ambulatory Visit (HOSPITAL_BASED_OUTPATIENT_CLINIC_OR_DEPARTMENT_OTHER): Payer: Medicare Other

## 2014-02-05 ENCOUNTER — Ambulatory Visit
Admission: RE | Admit: 2014-02-05 | Discharge: 2014-02-05 | Disposition: A | Discharge status: Home / Self Care | Payer: Medicare Other | Source: Ambulatory Visit | Attending: Radiation Oncology | Admitting: Radiation Oncology

## 2014-02-05 VITALS — BP 122/73 | HR 77 | Temp 98.1°F | Resp 18

## 2014-02-05 DIAGNOSIS — Z510 Encounter for antineoplastic radiation therapy: Secondary | ICD-10-CM | POA: Diagnosis not present

## 2014-02-05 DIAGNOSIS — C3491 Malignant neoplasm of unspecified part of right bronchus or lung: Secondary | ICD-10-CM

## 2014-02-05 DIAGNOSIS — C3490 Malignant neoplasm of unspecified part of unspecified bronchus or lung: Secondary | ICD-10-CM

## 2014-02-05 DIAGNOSIS — Z5111 Encounter for antineoplastic chemotherapy: Secondary | ICD-10-CM

## 2014-02-05 LAB — CBC WITH DIFFERENTIAL/PLATELET
BASO%: 0.6 % (ref 0.0–2.0)
Basophils Absolute: 0 10*3/uL (ref 0.0–0.1)
EOS%: 1.8 % (ref 0.0–7.0)
Eosinophils Absolute: 0.1 10*3/uL (ref 0.0–0.5)
HEMATOCRIT: 42.9 % (ref 38.4–49.9)
HGB: 14.1 g/dL (ref 13.0–17.1)
LYMPH%: 8.8 % — AB (ref 14.0–49.0)
MCH: 28.1 pg (ref 27.2–33.4)
MCHC: 32.8 g/dL (ref 32.0–36.0)
MCV: 85.6 fL (ref 79.3–98.0)
MONO#: 0.2 10*3/uL (ref 0.1–0.9)
MONO%: 4.9 % (ref 0.0–14.0)
NEUT#: 2.9 10*3/uL (ref 1.5–6.5)
NEUT%: 83.9 % — AB (ref 39.0–75.0)
PLATELETS: 101 10*3/uL — AB (ref 140–400)
RBC: 5.01 10*6/uL (ref 4.20–5.82)
RDW: 14.7 % — ABNORMAL HIGH (ref 11.0–14.6)
WBC: 3.5 10*3/uL — ABNORMAL LOW (ref 4.0–10.3)
lymph#: 0.3 10*3/uL — ABNORMAL LOW (ref 0.9–3.3)

## 2014-02-05 LAB — COMPREHENSIVE METABOLIC PANEL (CC13)
ALK PHOS: 75 U/L (ref 40–150)
ALT: 12 U/L (ref 0–55)
ANION GAP: 9 meq/L (ref 3–11)
AST: 10 U/L (ref 5–34)
Albumin: 3.3 g/dL — ABNORMAL LOW (ref 3.5–5.0)
BILIRUBIN TOTAL: 0.38 mg/dL (ref 0.20–1.20)
BUN: 19.7 mg/dL (ref 7.0–26.0)
CO2: 27 meq/L (ref 22–29)
CREATININE: 1 mg/dL (ref 0.7–1.3)
Calcium: 9.7 mg/dL (ref 8.4–10.4)
Chloride: 104 mEq/L (ref 98–109)
Glucose: 132 mg/dl (ref 70–140)
Potassium: 4.6 mEq/L (ref 3.5–5.1)
SODIUM: 139 meq/L (ref 136–145)
Total Protein: 7.3 g/dL (ref 6.4–8.3)

## 2014-02-05 MED ORDER — ONDANSETRON 16 MG/50ML IVPB (CHCC)
INTRAVENOUS | Status: AC
  Filled 2014-02-05: qty 16

## 2014-02-05 MED ORDER — DEXAMETHASONE SODIUM PHOSPHATE 20 MG/5ML IJ SOLN
20.0000 mg | Freq: Once | INTRAMUSCULAR | Status: AC
  Administered 2014-02-05: 20 mg via INTRAVENOUS

## 2014-02-05 MED ORDER — DEXAMETHASONE SODIUM PHOSPHATE 20 MG/5ML IJ SOLN
INTRAMUSCULAR | Status: AC
  Filled 2014-02-05: qty 5

## 2014-02-05 MED ORDER — ONDANSETRON 16 MG/50ML IVPB (CHCC)
16.0000 mg | Freq: Once | INTRAVENOUS | Status: AC
  Administered 2014-02-05: 16 mg via INTRAVENOUS

## 2014-02-05 MED ORDER — SODIUM CHLORIDE 0.9 % IV SOLN
300.0000 mg | Freq: Once | INTRAVENOUS | Status: AC
  Administered 2014-02-05: 300 mg via INTRAVENOUS
  Filled 2014-02-05: qty 30

## 2014-02-05 MED ORDER — SODIUM CHLORIDE 0.9 % IV SOLN
Freq: Once | INTRAVENOUS | Status: AC
  Administered 2014-02-05: 11:00:00 via INTRAVENOUS

## 2014-02-05 NOTE — Patient Instructions (Signed)
[facility] Discharge Instructions for Patients Receiving Chemotherapy  Today you received the following chemotherapy agents Carboplatin.  To help prevent nausea and vomiting after your treatment, we encourage you to take your nausea medication as prescribed.   If you develop nausea and vomiting that is not controlled by your nausea medication, call the clinic.   BELOW ARE SYMPTOMS THAT SHOULD BE REPORTED IMMEDIATELY:  *FEVER GREATER THAN 100.5 F  *CHILLS WITH OR WITHOUT FEVER  NAUSEA AND VOMITING THAT IS NOT CONTROLLED WITH YOUR NAUSEA MEDICATION  *UNUSUAL SHORTNESS OF BREATH  *UNUSUAL BRUISING OR BLEEDING  TENDERNESS IN MOUTH AND THROAT WITH OR WITHOUT PRESENCE OF ULCERS  *URINARY PROBLEMS  *BOWEL PROBLEMS  UNUSUAL RASH Items with * indicate a potential emergency and should be followed up as soon as possible.  Feel free to call the clinic you have any questions or concerns. The clinic phone number is [phone].

## 2014-02-06 ENCOUNTER — Ambulatory Visit
Admission: RE | Admit: 2014-02-06 | Discharge: 2014-02-06 | Disposition: A | Discharge status: Home / Self Care | Payer: Medicare Other | Source: Ambulatory Visit | Attending: Radiation Oncology | Admitting: Radiation Oncology

## 2014-02-06 DIAGNOSIS — Z510 Encounter for antineoplastic radiation therapy: Secondary | ICD-10-CM | POA: Diagnosis not present

## 2014-02-07 ENCOUNTER — Ambulatory Visit
Admission: RE | Admit: 2014-02-07 | Discharge: 2014-02-07 | Disposition: A | Discharge status: Home / Self Care | Payer: Medicare Other | Source: Ambulatory Visit | Attending: Radiation Oncology | Admitting: Radiation Oncology

## 2014-02-07 DIAGNOSIS — Z510 Encounter for antineoplastic radiation therapy: Secondary | ICD-10-CM | POA: Diagnosis not present

## 2014-02-08 ENCOUNTER — Ambulatory Visit
Admission: RE | Admit: 2014-02-08 | Discharge: 2014-02-08 | Disposition: A | Discharge status: Home / Self Care | Payer: Medicare Other | Source: Ambulatory Visit | Attending: Radiation Oncology | Admitting: Radiation Oncology

## 2014-02-08 ENCOUNTER — Ambulatory Visit: Payer: Medicare Other

## 2014-02-08 ENCOUNTER — Encounter: Payer: Self-pay | Admitting: Radiation Oncology

## 2014-02-08 VITALS — BP 135/101 | HR 78 | Resp 16 | Wt 270.3 lb

## 2014-02-08 DIAGNOSIS — C3410 Malignant neoplasm of upper lobe, unspecified bronchus or lung: Secondary | ICD-10-CM

## 2014-02-08 DIAGNOSIS — Z510 Encounter for antineoplastic radiation therapy: Secondary | ICD-10-CM | POA: Diagnosis not present

## 2014-02-08 NOTE — Progress Notes (Signed)
Reports mild discomfort when he swallows his food. Weight stable. Denies pain. Mild hyperpigmentation without desquamation within treatment field noted. Reports he has only used radiaplex once or twice. Encouraged patient to use radiaplex bid. Reports dry cough continues. Denise shortness of breath. Blood pressure elevated. Patient denies headache or dizziness. Reports he is excited to travel to the beach.

## 2014-02-08 NOTE — Progress Notes (Signed)
   Department of Radiation Oncology  Phone:  [phone] Fax:        [phone]  Weekly Treatment Note    Name: COLETON WOON Date: 02/08/2014 MRN: [id number] DOB: [birth year]   Current dose: 64 Gy  Current fraction: 32   MEDICATIONS: Current Outpatient Prescriptions  Medication Sig Dispense Refill  . albuterol (PROVENTIL HFA;VENTOLIN HFA) 108 (90 BASE) MCG/ACT inhaler Inhale 1-2 puffs into the lungs every 6 (six) hours as needed for wheezing or shortness of breath.      Marland Kitchen aspirin EC 81 MG tablet Take 81 mg by mouth at bedtime.      . metoprolol (LOPRESSOR) 100 MG tablet Take 100 mg by mouth at bedtime.      . prochlorperazine (COMPAZINE) 10 MG tablet Take 1 tablet (10 mg total) by mouth every 6 (six) hours as needed for nausea or vomiting.  60 tablet  0  . ranitidine (ZANTAC) 150 MG tablet Take 150 mg by mouth daily.      . simvastatin (ZOCOR) 40 MG tablet Take 40 mg by mouth every evening.      . tiotropium (SPIRIVA) 18 MCG inhalation capsule Place 18 mcg into inhaler and inhale daily.      . Wound Cleansers (RADIAPLEX EX) Apply topically.       No current facility-administered medications for this encounter.     ALLERGIES: Taxol; Plavix; and Other   LABORATORY DATA:  Lab Results  Component Value Date   WBC 3.5* 02/05/2014   HGB 14.1 02/05/2014   HCT 42.9 02/05/2014   MCV 85.6 02/05/2014   PLT 101* 02/05/2014   Lab Results  Component Value Date   NA 139 02/05/2014   K 4.6 02/05/2014   CL 101 12/08/2013   CO2 27 02/05/2014   Lab Results  Component Value Date   ALT 12 02/05/2014   AST 10 02/05/2014   ALKPHOS 75 02/05/2014   BILITOT 0.38 02/05/2014     NARRATIVE: Elayne Guerin was seen today for weekly treatment management. The chart was checked and the patient's films were reviewed. The patient states that he continues to do very well. Very mild esophagitis which has not substantially changed. No complaints of skin changes/irritation. He has a dry cough on occasion,  no change.  PHYSICAL EXAMINATION: weight is 270 lb 4.8 oz (122.607 kg). His blood pressure is 135/101 and his pulse is 78. His respiration is 16.        ASSESSMENT: The patient is doing satisfactorily with treatment.  PLAN: We will continue with the patient's radiation treatment as planned. the patient has one more fraction and then will followup with Dr. Tammi Klippel in our clinic in one month.

## 2014-02-12 ENCOUNTER — Other Ambulatory Visit (HOSPITAL_BASED_OUTPATIENT_CLINIC_OR_DEPARTMENT_OTHER): Payer: Medicare Other

## 2014-02-12 ENCOUNTER — Encounter: Payer: Self-pay | Admitting: Radiation Oncology

## 2014-02-12 ENCOUNTER — Ambulatory Visit
Admission: RE | Admit: 2014-02-12 | Discharge: 2014-02-12 | Disposition: A | Discharge status: Home / Self Care | Payer: Medicare Other | Source: Ambulatory Visit | Attending: Radiation Oncology | Admitting: Radiation Oncology

## 2014-02-12 ENCOUNTER — Ambulatory Visit: Payer: Medicare Other

## 2014-02-12 DIAGNOSIS — Z510 Encounter for antineoplastic radiation therapy: Secondary | ICD-10-CM | POA: Diagnosis not present

## 2014-02-12 DIAGNOSIS — C3410 Malignant neoplasm of upper lobe, unspecified bronchus or lung: Secondary | ICD-10-CM

## 2014-02-12 LAB — COMPREHENSIVE METABOLIC PANEL (CC13)
ALT: 13 U/L (ref 0–55)
ANION GAP: 6 meq/L (ref 3–11)
AST: 11 U/L (ref 5–34)
Albumin: 3.2 g/dL — ABNORMAL LOW (ref 3.5–5.0)
Alkaline Phosphatase: 67 U/L (ref 40–150)
BUN: 15.2 mg/dL (ref 7.0–26.0)
CALCIUM: 9.5 mg/dL (ref 8.4–10.4)
CHLORIDE: 103 meq/L (ref 98–109)
CO2: 31 meq/L — AB (ref 22–29)
CREATININE: 0.8 mg/dL (ref 0.7–1.3)
GLUCOSE: 102 mg/dL (ref 70–140)
Potassium: 4.5 mEq/L (ref 3.5–5.1)
Sodium: 140 mEq/L (ref 136–145)
Total Bilirubin: 0.32 mg/dL (ref 0.20–1.20)
Total Protein: 6.9 g/dL (ref 6.4–8.3)

## 2014-02-12 LAB — CBC WITH DIFFERENTIAL/PLATELET
BASO%: 0.5 % (ref 0.0–2.0)
BASOS ABS: 0 10*3/uL (ref 0.0–0.1)
EOS ABS: 0 10*3/uL (ref 0.0–0.5)
EOS%: 1.9 % (ref 0.0–7.0)
HCT: 41.4 % (ref 38.4–49.9)
HGB: 13.8 g/dL (ref 13.0–17.1)
LYMPH#: 0.3 10*3/uL — AB (ref 0.9–3.3)
LYMPH%: 13.6 % — ABNORMAL LOW (ref 14.0–49.0)
MCH: 28.7 pg (ref 27.2–33.4)
MCHC: 33.3 g/dL (ref 32.0–36.0)
MCV: 86.1 fL (ref 79.3–98.0)
MONO#: 0.4 10*3/uL (ref 0.1–0.9)
MONO%: 19.9 % — ABNORMAL HIGH (ref 0.0–14.0)
NEUT#: 1.3 10*3/uL — ABNORMAL LOW (ref 1.5–6.5)
NEUT%: 64.1 % (ref 39.0–75.0)
Platelets: 129 10*3/uL — ABNORMAL LOW (ref 140–400)
RBC: 4.81 10*6/uL (ref 4.20–5.82)
RDW: 16 % — ABNORMAL HIGH (ref 11.0–14.6)
WBC: 2.1 10*3/uL — AB (ref 4.0–10.3)

## 2014-02-12 NOTE — Progress Notes (Signed)
  Radiation Oncology         [phone] ________________________________  Name: Carlos Goodwin MRN: [id number]  Date: 02/12/2014  DOB: 1950-11-04  End of Treatment Note  Diagnosis:   63 year old gentlemen with stage T2b N2 M0 adenocarcinoma of the right upper lobe lung-stage IIIA   Indication for treatment:  Curative, Concurrent Chemo-Radiotherapy       Radiation treatment dates:   12/25/2013-02/12/2014  Site/dose:   The primary tumor and involved adenopathy was treated to 66 Gy in 33 fractions  Beams/energy:   A five field technique was used to conform radiation to the target while minimizing exposure to the Left Lung, Right Lung, spinal cord, esophagus, and heart. 6, 10, and 15 MV X-rays were used.  Narrative: The patient tolerated radiation treatment relatively well.   He experienced very mild esophagitis   Plan: The patient has completed radiation treatment. The patient will return to radiation oncology clinic for routine followup in one month. I advised them to call or return sooner if they have any questions or concerns related to their recovery or treatment. ________________________________  Sheral Apley. Tammi Klippel, M.D.

## 2014-02-20 ENCOUNTER — Telehealth: Payer: Self-pay | Admitting: Radiation Oncology

## 2014-02-20 NOTE — Telephone Encounter (Signed)
Received call from patient's wife. She is concerned that occasionally in the morning time her husband will cough up a small amount of bright red blood. Also, she reports his throat remains sore. Patient just completed radiation treatment on 02/12/2014. Reassured her that he is recovering from the side effects of radiation treatment. Will check back in with the patient and his wife on Friday to see if symptoms are getting better. Will inform Dr. Tammi Klippel of these findings. She understands if her husband has difficulty breathing or increased shortness of breath to call back or take him to the ED.

## 2014-02-23 ENCOUNTER — Telehealth: Payer: Self-pay | Admitting: Radiation Oncology

## 2014-02-23 NOTE — Telephone Encounter (Signed)
Phoned patient at home to assess status. Patient reports he is no longer coughing up small amounts of bright red blood and his sore throat has resolved. He does report his cough still persist. Reassured patient he is healing from effects of radiation. Overall patient is glad to be feeling better with fewer symptoms. Encouraged patient to contact staff with future needs and he verbalized understanding and expressed appreciation for the call.

## 2014-03-08 ENCOUNTER — Other Ambulatory Visit: Payer: Self-pay | Admitting: Medical Oncology

## 2014-03-08 DIAGNOSIS — C3410 Malignant neoplasm of upper lobe, unspecified bronchus or lung: Secondary | ICD-10-CM

## 2014-03-09 ENCOUNTER — Other Ambulatory Visit (HOSPITAL_BASED_OUTPATIENT_CLINIC_OR_DEPARTMENT_OTHER): Payer: Medicare Other

## 2014-03-09 ENCOUNTER — Encounter (HOSPITAL_COMMUNITY): Payer: Self-pay

## 2014-03-09 ENCOUNTER — Ambulatory Visit (HOSPITAL_COMMUNITY)
Admission: RE | Admit: 2014-03-09 | Discharge: 2014-03-09 | Disposition: A | Discharge status: Home / Self Care | Payer: Medicare Other | Source: Ambulatory Visit | Attending: Internal Medicine | Admitting: Internal Medicine

## 2014-03-09 DIAGNOSIS — C3410 Malignant neoplasm of upper lobe, unspecified bronchus or lung: Secondary | ICD-10-CM | POA: Diagnosis not present

## 2014-03-09 LAB — CBC WITH DIFFERENTIAL/PLATELET
BASO%: 0.7 % (ref 0.0–2.0)
BASOS ABS: 0 10*3/uL (ref 0.0–0.1)
EOS%: 2.4 % (ref 0.0–7.0)
Eosinophils Absolute: 0.1 10*3/uL (ref 0.0–0.5)
HCT: 45.2 % (ref 38.4–49.9)
HEMOGLOBIN: 14.6 g/dL (ref 13.0–17.1)
LYMPH#: 0.5 10*3/uL — AB (ref 0.9–3.3)
LYMPH%: 8.3 % — ABNORMAL LOW (ref 14.0–49.0)
MCH: 29.4 pg (ref 27.2–33.4)
MCHC: 32.4 g/dL (ref 32.0–36.0)
MCV: 90.7 fL (ref 79.3–98.0)
MONO#: 0.6 10*3/uL (ref 0.1–0.9)
MONO%: 10.1 % (ref 0.0–14.0)
NEUT#: 4.3 10*3/uL (ref 1.5–6.5)
NEUT%: 78.5 % — ABNORMAL HIGH (ref 39.0–75.0)
Platelets: 132 10*3/uL — ABNORMAL LOW (ref 140–400)
RBC: 4.98 10*6/uL (ref 4.20–5.82)
RDW: 22.7 % — AB (ref 11.0–14.6)
WBC: 5.5 10*3/uL (ref 4.0–10.3)

## 2014-03-09 LAB — COMPREHENSIVE METABOLIC PANEL (CC13)
ALBUMIN: 3.5 g/dL (ref 3.5–5.0)
ALT: 13 U/L (ref 0–55)
AST: 15 U/L (ref 5–34)
Alkaline Phosphatase: 67 U/L (ref 40–150)
Anion Gap: 7 mEq/L (ref 3–11)
BUN: 18.2 mg/dL (ref 7.0–26.0)
CALCIUM: 9.4 mg/dL (ref 8.4–10.4)
CHLORIDE: 105 meq/L (ref 98–109)
CO2: 31 mEq/L — ABNORMAL HIGH (ref 22–29)
Creatinine: 0.9 mg/dL (ref 0.7–1.3)
Glucose: 114 mg/dl (ref 70–140)
POTASSIUM: 4.8 meq/L (ref 3.5–5.1)
Sodium: 143 mEq/L (ref 136–145)
Total Bilirubin: 0.35 mg/dL (ref 0.20–1.20)
Total Protein: 6.9 g/dL (ref 6.4–8.3)

## 2014-03-09 MED ORDER — IOHEXOL 300 MG/ML  SOLN
80.0000 mL | Freq: Once | INTRAMUSCULAR | Status: AC | PRN
  Administered 2014-03-09: 80 mL via INTRAVENOUS

## 2014-03-12 ENCOUNTER — Ambulatory Visit (HOSPITAL_BASED_OUTPATIENT_CLINIC_OR_DEPARTMENT_OTHER): Payer: Medicare Other | Admitting: Internal Medicine

## 2014-03-12 ENCOUNTER — Telehealth: Payer: Self-pay | Admitting: Physician Assistant

## 2014-03-12 ENCOUNTER — Encounter: Payer: Self-pay | Admitting: Internal Medicine

## 2014-03-12 VITALS — BP 135/69 | HR 81 | Temp 98.2°F | Resp 18 | Ht 70.0 in | Wt 273.7 lb

## 2014-03-12 DIAGNOSIS — C3410 Malignant neoplasm of upper lobe, unspecified bronchus or lung: Secondary | ICD-10-CM

## 2014-03-12 DIAGNOSIS — C3491 Malignant neoplasm of unspecified part of right bronchus or lung: Secondary | ICD-10-CM

## 2014-03-12 NOTE — Progress Notes (Signed)
[place] [phone]   [phone]  OFFICE PROGRESS NOTE  Alonza Bogus, MD [place] [address]   DIAGNOSIS: Stage IIIA (T2a, N2, M0) non-small cell lung cancer consistent with adenocarcinoma diagnosed in May of 2014, presented with right upper lobe lung mass in addition to mediastinal lymphadenopathy.  PRIOR THERAPY:  Concurrent chemoradiation with weekly carboplatin for AUC of 2 and paclitaxel 45 mg/M2, status post 7 cycle, with partial response. Paclitaxel was discontinued at cycle #3 secondary to hypersensitivity reaction.   CURRENT THERAPY: Systemic chemotherapy with carboplatin for AUC of 5 on day 1 and gemcitabine 1000 mg/M2 on days 1 and 8 every 3 weeks. First dose expected 03/28/2014.  INTERVAL HISTORY: Carlos Goodwin 63 y.o. male returns to the clinic today for followup visit accompanied by his wife and daughter. He completed a course of concurrent chemoradiation initially with carboplatin and paclitaxel for 3 cycles followed by 4 more cycles with single agent carboplatin after the patient developed hypersensitivity reaction to the paclitaxel. He completed his radiotherapy in early July of 2015. He has been off treatment for the last few weeks and feeling much better. He continues to have shortness breath with exertion but no significant chest pain, or hemoptysis. He has cough productive of whitish sputum. He denied having any significant fever or chills, no nausea or vomiting. He has no significant weight loss or night sweats. The patient had repeat CT scan of the chest performed recently and he is here for evaluation and discussion of his scan results.  MEDICAL HISTORY: Past Medical History  Diagnosis Date  . COPD (chronic obstructive pulmonary disease)   . Coronary artery disease   . Sleep apnea   . Hypertension   . High cholesterol   . MI (myocardial infarction) 2004  . GERD (gastroesophageal reflux  disease)   . Family history of anesthesia complication     Daughter has nausea and vomiting  . Shortness of breath   . lung ca dx'd 10/2013    lungs x 3 spots    ALLERGIES:  is allergic to taxol; plavix; and other.  MEDICATIONS:  Current Outpatient Prescriptions  Medication Sig Dispense Refill  . aspirin EC 81 MG tablet Take 81 mg by mouth at bedtime.      . metoprolol (LOPRESSOR) 100 MG tablet Take 100 mg by mouth at bedtime.      . ranitidine (ZANTAC) 150 MG tablet Take 150 mg by mouth daily.      . simvastatin (ZOCOR) 40 MG tablet Take 40 mg by mouth every evening.      . tiotropium (SPIRIVA) 18 MCG inhalation capsule Place 18 mcg into inhaler and inhale daily.      Marland Kitchen albuterol (PROVENTIL HFA;VENTOLIN HFA) 108 (90 BASE) MCG/ACT inhaler Inhale 1-2 puffs into the lungs every 6 (six) hours as needed for wheezing or shortness of breath.      . prochlorperazine (COMPAZINE) 10 MG tablet Take 1 tablet (10 mg total) by mouth every 6 (six) hours as needed for nausea or vomiting.  60 tablet  0  . Wound Cleansers (RADIAPLEX EX) Apply topically.       No current facility-administered medications for this visit.    SURGICAL HISTORY:  Past Surgical History  Procedure Laterality Date  . Appendectomy    . Coronary angioplasty  2004    1 stent  . Anterior cruciate ligament repair Right   . Tonsillectomy    .  Prostate biopsy N/A 06/13/2013    Procedure: ULTRASOUND GUIDED PROSTATE BIOPSY;  Surgeon: Marissa Nestle, MD;  Location: [facility];  Service: Urology;  Laterality: N/A;  . Video bronchoscopy with endobronchial navigation N/A 12/12/2013    Procedure: VIDEO BRONCHOSCOPY WITH ENDOBRONCHIAL NAVIGATION;  Surgeon: Melrose Nakayama, MD;  Location: [place];  Service: Thoracic;  Laterality: N/A;  . Video bronchoscopy with endobronchial ultrasound N/A 12/12/2013    Procedure: VIDEO BRONCHOSCOPY WITH ENDOBRONCHIAL ULTRASOUND;  Surgeon: Melrose Nakayama, MD;  Location: [facility];  Service: Thoracic;   Laterality: N/A;    REVIEW OF SYSTEMS:  Constitutional: positive for fatigue Eyes: negative Ears, nose, mouth, throat, and face: negative Respiratory: positive for cough and dyspnea on exertion Cardiovascular: negative Gastrointestinal: negative Genitourinary:negative Integument/breast: negative Hematologic/lymphatic: negative Musculoskeletal:negative Neurological: negative Behavioral/Psych: negative Endocrine: negative Allergic/Immunologic: negative   PHYSICAL EXAMINATION: General appearance: alert, cooperative and no distress Head: Normocephalic, without obvious abnormality, atraumatic Neck: no adenopathy, no JVD, supple, symmetrical, trachea midline and thyroid not enlarged, symmetric, no tenderness/mass/nodules Lymph nodes: Cervical, supraclavicular, and axillary nodes normal. Resp: clear to auscultation bilaterally Back: symmetric, no curvature. ROM normal. No CVA tenderness. Cardio: regular rate and rhythm, S1, S2 normal, no murmur, click, rub or gallop GI: soft, non-tender; bowel sounds normal; no masses,  no organomegaly Extremities: extremities normal, atraumatic, no cyanosis or edema Neurologic: Alert and oriented X 3, normal strength and tone. Normal symmetric reflexes. Normal coordination and gait  ECOG PERFORMANCE STATUS: 1 - Symptomatic but completely ambulatory  Blood pressure 135/69, pulse 81, temperature 98.2 F (36.8 C), temperature source Oral, resp. rate 18, height 5\' 10"  (1.778 m), weight 273 lb 11.2 oz (124.15 kg), SpO2 93.00%.  LABORATORY DATA: Lab Results  Component Value Date   WBC 5.5 03/09/2014   HGB 14.6 03/09/2014   HCT 45.2 03/09/2014   MCV 90.7 03/09/2014   PLT 132* 03/09/2014      Chemistry      Component Value Date/Time   NA 143 03/09/2014 0851   NA 141 12/12/2013 0626   K 4.8 03/09/2014 0851   K 4.6 12/12/2013 0626   CL 101 12/08/2013 1029   CO2 31* 03/09/2014 0851   CO2 30 12/08/2013 1029   BUN 18.2 03/09/2014 0851   BUN 15 12/08/2013 1029    CREATININE 0.9 03/09/2014 0851   CREATININE 0.84 12/08/2013 1029      Component Value Date/Time   CALCIUM 9.4 03/09/2014 0851   CALCIUM 9.4 12/08/2013 1029   ALKPHOS 67 03/09/2014 0851   AST 15 03/09/2014 0851   ALT 13 03/09/2014 0851   BILITOT 0.35 03/09/2014 0851       RADIOGRAPHIC STUDIES: Ct Chest W Contrast  03/09/2014   CLINICAL DATA:  Followup lung cancer. Chemotherapy and radiation therapy completed.  EXAM: CT CHEST WITH CONTRAST  TECHNIQUE: Multidetector CT imaging of the chest was performed during intravenous contrast administration.  CONTRAST:  16mL OMNIPAQUE IOHEXOL 300 MG/ML  SOLN  COMPARISON:  PET 12/08/2013 and CT chest 12/06/2013, 01/23/2009.  FINDINGS: Mediastinal lymph nodes have decreased in size in the interval. Index lower right paratracheal lymph node measures 10 mm (previously 14 mm). Right hilar lymph node measures 1.5 cm (previously 2.5 cm). No axillary adenopathy. Scattered probable sebaceous cysts in the subcutaneous soft tissues. At least 2 vessel coronary artery calcification. Heart size normal. No pericardial effusion.  Mass in the apical segment right upper lobe has decreased in size in the interval, now measuring 1.6 x 3.0 cm (previously 3.8 x 3.9  cm). A separate area of nodular consolidation seen the inferiorly within the right upper lobe measures 8 x 12 mm (previously 16 x 26 mm). Sub cm subpleural nodularity along the right hemidiaphragm, unchanged. 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 Scarring and volume loss in the lingula and left lower lobe. There is narrowing of the right-sided bronchi, worst to the right upper lobe.  Incidental imaging of the upper abdomen shows a 9 mm low-attenuation lesion in the inferior right hepatic lobe, unchanged from 01/23/2009. Visualized portions of the liver, gallbladder, adrenal glands, kidneys, spleen, pancreas, stomach and bowel are otherwise grossly unremarkable. No upper abdominal adenopathy. No worrisome lytic or sclerotic  lesions. Degenerative changes are seen in the spine.  IMPRESSION: 1. Interval decrease in size of right upper lobe mass and mediastinal/right hilar adenopathy. Associated narrowing of right upper lobe bronchi. No evidence of distant metastatic disease. 2. Coronary artery calcification. 3. 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   Electronically Signed   By: Lorin Picket M.D.   On: 03/09/2014 11:31   ASSESSMENT AND PLAN: This is a very pleasant 63 years old white male recently diagnosed with a stage IIIA non-small cell lung cancer, completed a course of concurrent chemoradiation with weekly carboplatin and paclitaxel is status post 7 cycle. He tolerated the first cycle of his treatment fairly well. Paclitaxel was discontinued secondary to hypersensitivity reaction at cycle #3. His recent CT scan of the chest showed significant improvement in his disease. I discussed the scan results and showed the images to the patient and his family. I discussed with him several options for management of his condition including continuous observation and close monitoring versus consolidation chemotherapy with single agent docetaxel versus consolidation chemotherapy with carboplatin and gemcitabine.  Because of the concern about his sensitivity reaction to Taxotere, the patient would like to consider consolidation chemotherapy with carboplatin and gemcitabine. I discussed with him the adverse effect of this treatment including but not limited to alopecia, myelosuppression, nausea and vomiting, peripheral neuropathy, liver or renal dysfunction. He is expected to start the first cycle of this treatment on 03/28/2014. He would come back for followup visit in one month's for reevaluation before starting cycle #2. The patient was advised to call immediately if he has any concerning symptoms in the interval. He was advised to call immediately if he has any concerning symptoms in  the interval. The patient voices understanding of current disease status and treatment options and is in agreement with the current care plan.  All questions were answered. The patient knows to call the clinic with any problems, questions or concerns. We can certainly see the patient much sooner if necessary.  Disclaimer: This note was dictated with voice recognition software. Similar sounding words can inadvertently be transcribed and may not be corrected upon review.

## 2014-03-12 NOTE — Telephone Encounter (Signed)
Pt confirmed labs/ov per 08/03 POF, gave pt AVS...>KJ

## 2014-03-12 NOTE — Patient Instructions (Signed)
Smoking Cessation Quitting smoking is important to your health and has many advantages. However, it is not always easy to quit since nicotine is a very addictive drug. Oftentimes, people try 3 times or more before being able to quit. This document explains the best ways for you to prepare to quit smoking. Quitting takes hard work and a lot of effort, but you can do it. ADVANTAGES OF QUITTING SMOKING  You will live longer, feel better, and live better.  Your body will feel the impact of quitting smoking almost immediately.  Within 20 minutes, blood pressure decreases. Your pulse returns to its normal level.  After 8 hours, carbon monoxide levels in the blood return to normal. Your oxygen level increases.  After 24 hours, the chance of having a heart attack starts to decrease. Your breath, hair, and body stop smelling like smoke.  After 48 hours, damaged nerve endings begin to recover. Your sense of taste and smell improve.  After 72 hours, the body is virtually free of nicotine. Your bronchial tubes relax and breathing becomes easier.  After 2 to 12 weeks, lungs can hold more air. Exercise becomes easier and circulation improves.  The risk of having a heart attack, stroke, cancer, or lung disease is greatly reduced.  After 1 year, the risk of coronary heart disease is cut in half.  After 5 years, the risk of stroke falls to the same as a nonsmoker.  After 10 years, the risk of lung cancer is cut in half and the risk of other cancers decreases significantly.  After 15 years, the risk of coronary heart disease drops, usually to the level of a nonsmoker.  If you are pregnant, quitting smoking will improve your chances of having a healthy baby.  The people you live with, especially any children, will be healthier.  You will have extra money to spend on things other than cigarettes. QUESTIONS TO THINK ABOUT BEFORE ATTEMPTING TO QUIT You may want to talk about your answers with your  health care provider.  Why do you want to quit?  If you tried to quit in the past, what helped and what did not?  What will be the most difficult situations for you after you quit? How will you plan to handle them?  Who can help you through the tough times? Your family? Friends? A health care provider?  What pleasures do you get from smoking? What ways can you still get pleasure if you quit? Here are some questions to ask your health care provider:  How can you help me to be successful at quitting?  What medicine do you think would be best for me and how should I take it?  What should I do if I need more help?  What is smoking withdrawal like? How can I get information on withdrawal? GET READY  Set a quit date.  Change your environment by getting rid of all cigarettes, ashtrays, matches, and lighters in your home, car, or work. Do not let people smoke in your home.  Review your past attempts to quit. Think about what worked and what did not. GET SUPPORT AND ENCOURAGEMENT You have a better chance of being successful if you have help. You can get support in many ways.  Tell your family, friends, and coworkers that you are going to quit and need their support. Ask them not to smoke around you.  Get individual, group, or telephone counseling and support. Programs are available at local hospitals and health centers. Call   your local health department for information about programs in your area.  Spiritual beliefs and practices may help some smokers quit.  Download a "quit meter" on your computer to keep track of quit statistics, such as how long you have gone without smoking, cigarettes not smoked, and money saved.  Get a self-help book about quitting smoking and staying off tobacco. LEARN NEW SKILLS AND BEHAVIORS  Distract yourself from urges to smoke. Talk to someone, go for a walk, or occupy your time with a task.  Change your normal routine. Take a different route to work.  Drink tea instead of coffee. Eat breakfast in a different place.  Reduce your stress. Take a hot bath, exercise, or read a book.  Plan something enjoyable to do every day. Reward yourself for not smoking.  Explore interactive web-based programs that specialize in helping you quit. GET MEDICINE AND USE IT CORRECTLY Medicines can help you stop smoking and decrease the urge to smoke. Combining medicine with the above behavioral methods and support can greatly increase your chances of successfully quitting smoking.  Nicotine replacement therapy helps deliver nicotine to your body without the negative effects and risks of smoking. Nicotine replacement therapy includes nicotine gum, lozenges, inhalers, nasal sprays, and skin patches. Some may be available over-the-counter and others require a prescription.  Antidepressant medicine helps people abstain from smoking, but how this works is unknown. This medicine is available by prescription.  Nicotinic receptor partial agonist medicine simulates the effect of nicotine in your brain. This medicine is available by prescription. Ask your health care provider for advice about which medicines to use and how to use them based on your health history. Your health care provider will tell you what side effects to look out for if you choose to be on a medicine or therapy. Carefully read the information on the package. Do not use any other product containing nicotine while using a nicotine replacement product.  RELAPSE OR DIFFICULT SITUATIONS Most relapses occur within the first 3 months after quitting. Do not be discouraged if you start smoking again. Remember, most people try several times before finally quitting. You may have symptoms of withdrawal because your body is used to nicotine. You may crave cigarettes, be irritable, feel very hungry, cough often, get headaches, or have difficulty concentrating. The withdrawal symptoms are only temporary. They are strongest  when you first quit, but they will go away within 10-14 days. To reduce the chances of relapse, try to:  Avoid drinking alcohol. Drinking lowers your chances of successfully quitting.  Reduce the amount of caffeine you consume. Once you quit smoking, the amount of caffeine in your body increases and can give you symptoms, such as a rapid heartbeat, sweating, and anxiety.  Avoid smokers because they can make you want to smoke.  Do not let weight gain distract you. Many smokers will gain weight when they quit, usually less than 10 pounds. Eat a healthy diet and stay active. You can always lose the weight gained after you quit.  Find ways to improve your mood other than smoking. FOR MORE INFORMATION  www.smokefree.gov  Document Released: 07/21/2001 Document Revised: 12/11/2013 Document Reviewed: 11/05/2011 [employer] Patient Information 2015 [employer]. This information is not intended to replace advice given to you by your health care provider. Make sure you discuss any questions you have with your health care provider.  

## 2014-03-15 ENCOUNTER — Ambulatory Visit
Admission: RE | Admit: 2014-03-15 | Discharge: 2014-03-15 | Disposition: A | Discharge status: Home / Self Care | Payer: Medicare Other | Source: Ambulatory Visit | Attending: Radiation Oncology | Admitting: Radiation Oncology

## 2014-03-15 ENCOUNTER — Telehealth: Payer: Self-pay | Admitting: Internal Medicine

## 2014-03-15 ENCOUNTER — Encounter: Payer: Self-pay | Admitting: Radiation Oncology

## 2014-03-15 VITALS — BP 131/76 | HR 73 | Resp 18 | Wt 275.9 lb

## 2014-03-15 DIAGNOSIS — C3410 Malignant neoplasm of upper lobe, unspecified bronchus or lung: Secondary | ICD-10-CM

## 2014-03-15 NOTE — Progress Notes (Signed)
Radiation Oncology         [phone] ________________________________  Name: Carlos Goodwin MRN: [id number]  Date: 03/15/2014  DOB: 1950/11/01  Follow-Up Visit Note  CC: Alonza Bogus, MD  Melrose Nakayama, *  Diagnosis:   63 year old gentlemen with stage T2b N2 M0 adenocarcinoma of the right upper lobe lung-stage IIIA s/p Concurrent Chemo-Radiotherapy 12/25/2013-02/12/2014 to 66 Gy   Interval Since Last Radiation:  4  weeks  Narrative:  The patient returns today for routine follow-up.  Denies pain. Reports "rough cough with blood tinged sputum" resolved one week ago. Reports shortness of breath with exertion is no worse or better. Denies chest pain. Described mild esophagitis. Denies headache, dizziness, nausea, vomiting, diarrhea, or night sweats. Starts carbo gemcitabine on 8/19                              ALLERGIES:  is allergic to taxol; plavix; and other.  Meds: Current Outpatient Prescriptions  Medication Sig Dispense Refill  . albuterol (PROVENTIL HFA;VENTOLIN HFA) 108 (90 BASE) MCG/ACT inhaler Inhale 1-2 puffs into the lungs every 6 (six) hours as needed for wheezing or shortness of breath.      Marland Kitchen aspirin EC 81 MG tablet Take 81 mg by mouth at bedtime.      . metoprolol (LOPRESSOR) 100 MG tablet Take 100 mg by mouth at bedtime.      . ranitidine (ZANTAC) 150 MG tablet Take 150 mg by mouth daily.      . simvastatin (ZOCOR) 40 MG tablet Take 40 mg by mouth every evening.      . tiotropium (SPIRIVA) 18 MCG inhalation capsule Place 18 mcg into inhaler and inhale daily.      . Wound Cleansers (RADIAPLEX EX) Apply topically.      . prochlorperazine (COMPAZINE) 10 MG tablet Take 1 tablet (10 mg total) by mouth every 6 (six) hours as needed for nausea or vomiting.  60 tablet  0   No current facility-administered medications for this encounter.    Physical Findings: The patient is in no acute distress. Patient is alert and oriented.  weight is 275 lb 14.4 oz (125.147 kg).  His blood pressure is 131/76 and his pulse is 73. His respiration is 18 and oxygen saturation is 95%. .  No significant changes.  Lab Findings: Lab Results  Component Value Date   WBC 5.5 03/09/2014   HGB 14.6 03/09/2014   HCT 45.2 03/09/2014   MCV 90.7 03/09/2014   PLT 132* 03/09/2014    @LASTCHEM @  Radiographic Findings: Ct Chest W Contrast  03/09/2014   CLINICAL DATA:  Followup lung cancer. Chemotherapy and radiation therapy completed.  EXAM: CT CHEST WITH CONTRAST  TECHNIQUE: Multidetector CT imaging of the chest was performed during intravenous contrast administration.  CONTRAST:  87mL OMNIPAQUE IOHEXOL 300 MG/ML  SOLN  COMPARISON:  PET 12/08/2013 and CT chest 12/06/2013, 01/23/2009.  FINDINGS: Mediastinal lymph nodes have decreased in size in the interval. Index lower right paratracheal lymph node measures 10 mm (previously 14 mm). Right hilar lymph node measures 1.5 cm (previously 2.5 cm). No axillary adenopathy. Scattered probable sebaceous cysts in the subcutaneous soft tissues. At least 2 vessel coronary artery calcification. Heart size normal. No pericardial effusion.  Mass in the apical segment right upper lobe has decreased in size in the interval, now measuring 1.6 x 3.0 cm (previously 3.8 x 3.9 cm). A separate area of nodular consolidation seen  the inferiorly within the right upper lobe measures 8 x 12 mm (previously 16 x 26 mm). Sub cm subpleural nodularity along the right hemidiaphragm, unchanged. 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 Scarring and volume loss in the lingula and left lower lobe. There is narrowing of the right-sided bronchi, worst to the right upper lobe.  Incidental imaging of the upper abdomen shows a 9 mm low-attenuation lesion in the inferior right hepatic lobe, unchanged from 01/23/2009. Visualized portions of the liver, gallbladder, adrenal glands, kidneys, spleen, pancreas, stomach and bowel are otherwise grossly unremarkable. No upper abdominal  adenopathy. No worrisome lytic or sclerotic lesions. Degenerative changes are seen in the spine.  IMPRESSION: 1. Interval decrease in size of right upper lobe mass and mediastinal/right hilar adenopathy. Associated narrowing of right upper lobe bronchi. No evidence of distant metastatic disease. 2. Coronary artery calcification. 3. 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   Electronically Signed   By: Lorin Picket M.D.   On: 03/09/2014 11:31    Impression:  The patient is recovering from the effects of radiation.  Plan:  Continue chemotherapy.  _____________________________________  Sheral Apley Tammi Klippel, M.D.

## 2014-03-15 NOTE — Progress Notes (Signed)
Weight and vitals stable. Denies pain. Reports "rough cough with blood tinged sputum" resolved one week ago. Reports shortness of breath with exertion is no worse or better. Denies chest pain. Described mild esophagitis. Denies headache, dizziness, nausea, vomiting, diarrhea, or night sweats. Starts caro gemcitabine on 8/19.

## 2014-03-15 NOTE — Telephone Encounter (Signed)
gv and printed appt sched and avs for Aug and Sept....added labs

## 2014-03-28 ENCOUNTER — Ambulatory Visit: Payer: Medicare Other

## 2014-03-28 ENCOUNTER — Other Ambulatory Visit (HOSPITAL_BASED_OUTPATIENT_CLINIC_OR_DEPARTMENT_OTHER): Payer: Medicare Other

## 2014-03-28 ENCOUNTER — Ambulatory Visit (HOSPITAL_BASED_OUTPATIENT_CLINIC_OR_DEPARTMENT_OTHER): Payer: Medicare Other

## 2014-03-28 ENCOUNTER — Encounter: Payer: Self-pay | Admitting: Physician Assistant

## 2014-03-28 ENCOUNTER — Ambulatory Visit (HOSPITAL_BASED_OUTPATIENT_CLINIC_OR_DEPARTMENT_OTHER): Payer: Medicare Other | Admitting: Physician Assistant

## 2014-03-28 VITALS — BP 122/74 | HR 64 | Temp 98.1°F | Resp 19 | Ht 70.0 in | Wt 284.0 lb

## 2014-03-28 DIAGNOSIS — C3491 Malignant neoplasm of unspecified part of right bronchus or lung: Secondary | ICD-10-CM

## 2014-03-28 DIAGNOSIS — C781 Secondary malignant neoplasm of mediastinum: Secondary | ICD-10-CM

## 2014-03-28 DIAGNOSIS — C3410 Malignant neoplasm of upper lobe, unspecified bronchus or lung: Secondary | ICD-10-CM

## 2014-03-28 DIAGNOSIS — Z5111 Encounter for antineoplastic chemotherapy: Secondary | ICD-10-CM

## 2014-03-28 LAB — COMPREHENSIVE METABOLIC PANEL (CC13)
ALK PHOS: 58 U/L (ref 40–150)
ALT: 14 U/L (ref 0–55)
AST: 14 U/L (ref 5–34)
Albumin: 3.6 g/dL (ref 3.5–5.0)
Anion Gap: 8 mEq/L (ref 3–11)
BUN: 17.6 mg/dL (ref 7.0–26.0)
CALCIUM: 9.3 mg/dL (ref 8.4–10.4)
CO2: 29 mEq/L (ref 22–29)
Chloride: 107 mEq/L (ref 98–109)
Creatinine: 0.9 mg/dL (ref 0.7–1.3)
Glucose: 107 mg/dl (ref 70–140)
POTASSIUM: 4.6 meq/L (ref 3.5–5.1)
SODIUM: 144 meq/L (ref 136–145)
TOTAL PROTEIN: 6.6 g/dL (ref 6.4–8.3)
Total Bilirubin: 0.2 mg/dL (ref 0.20–1.20)

## 2014-03-28 LAB — CBC WITH DIFFERENTIAL/PLATELET
BASO%: 0.5 % (ref 0.0–2.0)
Basophils Absolute: 0 10*3/uL (ref 0.0–0.1)
EOS%: 2.8 % (ref 0.0–7.0)
Eosinophils Absolute: 0.1 10*3/uL (ref 0.0–0.5)
HCT: 45.3 % (ref 38.4–49.9)
HGB: 14.6 g/dL (ref 13.0–17.1)
LYMPH#: 0.4 10*3/uL — AB (ref 0.9–3.3)
LYMPH%: 9.3 % — ABNORMAL LOW (ref 14.0–49.0)
MCH: 30.3 pg (ref 27.2–33.4)
MCHC: 32.2 g/dL (ref 32.0–36.0)
MCV: 94 fL (ref 79.3–98.0)
MONO#: 0.4 10*3/uL (ref 0.1–0.9)
MONO%: 8.8 % (ref 0.0–14.0)
NEUT%: 78.6 % — ABNORMAL HIGH (ref 39.0–75.0)
NEUTROS ABS: 3.8 10*3/uL (ref 1.5–6.5)
Platelets: 143 10*3/uL (ref 140–400)
RBC: 4.82 10*6/uL (ref 4.20–5.82)
RDW: 22.7 % — AB (ref 11.0–14.6)
WBC: 4.8 10*3/uL (ref 4.0–10.3)

## 2014-03-28 MED ORDER — SODIUM CHLORIDE 0.9 % IV SOLN
Freq: Once | INTRAVENOUS | Status: AC
  Administered 2014-03-28: 10:00:00 via INTRAVENOUS

## 2014-03-28 MED ORDER — ONDANSETRON 16 MG/50ML IVPB (CHCC)
INTRAVENOUS | Status: AC
  Filled 2014-03-28: qty 16

## 2014-03-28 MED ORDER — DEXAMETHASONE SODIUM PHOSPHATE 20 MG/5ML IJ SOLN
20.0000 mg | Freq: Once | INTRAMUSCULAR | Status: AC
  Administered 2014-03-28: 20 mg via INTRAVENOUS

## 2014-03-28 MED ORDER — ONDANSETRON 16 MG/50ML IVPB (CHCC)
16.0000 mg | Freq: Once | INTRAVENOUS | Status: AC
  Administered 2014-03-28: 16 mg via INTRAVENOUS

## 2014-03-28 MED ORDER — SODIUM CHLORIDE 0.9 % IV SOLN
750.0000 mg | Freq: Once | INTRAVENOUS | Status: AC
  Administered 2014-03-28: 750 mg via INTRAVENOUS
  Filled 2014-03-28: qty 75

## 2014-03-28 MED ORDER — DEXAMETHASONE SODIUM PHOSPHATE 20 MG/5ML IJ SOLN
INTRAMUSCULAR | Status: AC
  Filled 2014-03-28: qty 5

## 2014-03-28 MED ORDER — SODIUM CHLORIDE 0.9 % IV SOLN
1000.0000 mg/m2 | Freq: Once | INTRAVENOUS | Status: AC
  Administered 2014-03-28: 2470 mg via INTRAVENOUS
  Filled 2014-03-28: qty 64.96

## 2014-03-28 NOTE — Patient Instructions (Signed)
[facility] Discharge Instructions for Patients Receiving Chemotherapy  Today you received the following chemotherapy agents Gemzar and Carboplatin.  To help prevent nausea and vomiting after your treatment, we encourage you to take your nausea medication.   If you develop nausea and vomiting that is not controlled by your nausea medication, call the clinic.   BELOW ARE SYMPTOMS THAT SHOULD BE REPORTED IMMEDIATELY:  *FEVER GREATER THAN 100.5 F  *CHILLS WITH OR WITHOUT FEVER  NAUSEA AND VOMITING THAT IS NOT CONTROLLED WITH YOUR NAUSEA MEDICATION  *UNUSUAL SHORTNESS OF BREATH  *UNUSUAL BRUISING OR BLEEDING  TENDERNESS IN MOUTH AND THROAT WITH OR WITHOUT PRESENCE OF ULCERS  *URINARY PROBLEMS  *BOWEL PROBLEMS  UNUSUAL RASH Items with * indicate a potential emergency and should be followed up as soon as possible.  Feel free to call the clinic you have any questions or concerns. The clinic phone number is [phone].

## 2014-03-28 NOTE — Progress Notes (Signed)
[place] [phone]) [phone]   [phone]  SHARED VISIT PROGRESS NOTE  Alonza Bogus, MD [place] [address]   DIAGNOSIS: Stage IIIA (T2a, N2, M0) non-small cell lung cancer consistent with adenocarcinoma diagnosed in May of 2014, presented with right upper lobe lung mass in addition to mediastinal lymphadenopathy.  PRIOR THERAPY:  Concurrent chemoradiation with weekly carboplatin for AUC of 2 and paclitaxel 45 mg/M2, status post 7 cycle, with partial response. Paclitaxel was discontinued at cycle #3 secondary to hypersensitivity reaction.   CURRENT THERAPY: Systemic chemotherapy with carboplatin for AUC of 5 on day 1 and gemcitabine 1000 mg/M2 on days 1 and 8 every 3 weeks. First dose expected 03/28/2014.  INTERVAL HISTORY: Carlos Goodwin 63 y.o. male returns to the clinic today for followup visit accompanied by his wife and daughter. He completed a course of concurrent chemoradiation initially with carboplatin and paclitaxel for 3 cycles followed by 4 more cycles with single agent carboplatin after the patient developed hypersensitivity reaction to the paclitaxel. He completed his radiotherapy in early July of 2015. He has been off treatment for the last few weeks and feeling much better. His most recent CT scan showed improvement in his disease. He presents today to proceed with consolidation chemotherapy with  Carboplatin and gemcitabine. He reports increased cough productive of phlegm, no hemoptysis. He continues to have shortness breath with exertion but no significant chest pain.  He denied having any significant fever or chills, no nausea or vomiting. He has no significant weight loss or night sweats.  He  And his family recently returned from a cruise to [place], [place] and [facility]. He enjoyed himself immensely and gained 10 pounds.  MEDICAL HISTORY: Past Medical History  Diagnosis Date  . COPD (chronic  obstructive pulmonary disease)   . Coronary artery disease   . Sleep apnea   . Hypertension   . High cholesterol   . MI (myocardial infarction) 2004  . GERD (gastroesophageal reflux disease)   . Family history of anesthesia complication     Daughter has nausea and vomiting  . Shortness of breath   . lung ca dx'd 10/2013    lungs x 3 spots    ALLERGIES:  is allergic to taxol; plavix; and other.  MEDICATIONS:  Current Outpatient Prescriptions  Medication Sig Dispense Refill  . albuterol (PROVENTIL HFA;VENTOLIN HFA) 108 (90 BASE) MCG/ACT inhaler Inhale 1-2 puffs into the lungs every 6 (six) hours as needed for wheezing or shortness of breath.      Marland Kitchen aspirin EC 81 MG tablet Take 81 mg by mouth at bedtime.      . metoprolol (LOPRESSOR) 100 MG tablet Take 100 mg by mouth at bedtime.      . ranitidine (ZANTAC) 150 MG tablet Take 150 mg by mouth daily.      . simvastatin (ZOCOR) 40 MG tablet Take 40 mg by mouth every evening.      . tiotropium (SPIRIVA) 18 MCG inhalation capsule Place 18 mcg into inhaler and inhale daily.      . prochlorperazine (COMPAZINE) 10 MG tablet Take 1 tablet (10 mg total) by mouth every 6 (six) hours as needed for nausea or vomiting.  60 tablet  0  . Wound Cleansers (RADIAPLEX EX) Apply topically.       No current facility-administered medications for this visit.   Facility-Administered Medications Ordered in Other Visits  Medication Dose Route Frequency Provider  Last Rate Last Dose  . CARBOplatin (PARAPLATIN) 750 mg in sodium chloride 0.9 % 250 mL chemo infusion  750 mg Intravenous Once Curt Bears, MD      . Gemcitabine HCl (GEMZAR) 2,470 mg in sodium chloride 0.9 % 250 mL chemo infusion  1,000 mg/m2 (Treatment Plan Actual) Intravenous Once Curt Bears, MD      . ondansetron (ZOFRAN) IVPB 16 mg  16 mg Intravenous Once Curt Bears, MD   16 mg at 03/28/14 1031    SURGICAL HISTORY:  Past Surgical History  Procedure Laterality Date  . Appendectomy      . Coronary angioplasty  2004    1 stent  . Anterior cruciate ligament repair Right   . Tonsillectomy    . Prostate biopsy N/A 06/13/2013    Procedure: ULTRASOUND GUIDED PROSTATE BIOPSY;  Surgeon: Marissa Nestle, MD;  Location: [facility];  Service: Urology;  Laterality: N/A;  . Video bronchoscopy with endobronchial navigation N/A 12/12/2013    Procedure: VIDEO BRONCHOSCOPY WITH ENDOBRONCHIAL NAVIGATION;  Surgeon: Melrose Nakayama, MD;  Location: [place];  Service: Thoracic;  Laterality: N/A;  . Video bronchoscopy with endobronchial ultrasound N/A 12/12/2013    Procedure: VIDEO BRONCHOSCOPY WITH ENDOBRONCHIAL ULTRASOUND;  Surgeon: Melrose Nakayama, MD;  Location: [facility] [facility];  Service: Thoracic;  Laterality: N/A;    REVIEW OF SYSTEMS:  Constitutional: positive for fatigue Eyes: negative Ears, nose, mouth, throat, and face: negative Respiratory: positive for cough and dyspnea on exertion Cardiovascular: negative Gastrointestinal: negative Genitourinary:negative Integument/breast: negative Hematologic/lymphatic: negative Musculoskeletal:negative Neurological: negative Behavioral/Psych: negative Endocrine: negative Allergic/Immunologic: negative   PHYSICAL EXAMINATION: General appearance: alert, cooperative and no distress Head: Normocephalic, without obvious abnormality, atraumatic Neck: no adenopathy, no JVD, supple, symmetrical, trachea midline and thyroid not enlarged, symmetric, no tenderness/mass/nodules Lymph nodes: Cervical, supraclavicular, and axillary nodes normal. Resp: rhonchi bibasilar and wheezes bilaterally Back: symmetric, no curvature. ROM normal. No CVA tenderness. Cardio: regular rate and rhythm, S1, S2 normal, no murmur, click, rub or gallop GI: soft, non-tender; bowel sounds normal; no masses,  no organomegaly Extremities: extremities normal, atraumatic, no cyanosis or edema Neurologic: Alert and oriented X 3, normal strength and tone. Normal symmetric reflexes.  Normal coordination and gait  ECOG PERFORMANCE STATUS: 1 - Symptomatic but completely ambulatory  Blood pressure 122/74, pulse 64, temperature 98.1 F (36.7 C), temperature source Oral, resp. rate 19, height _0  (1.778 m), weight 284 lb (128.822 kg).  LABORATORY DATA: Lab Results  Component Value Date   WBC 4.8 03/28/2014   HGB 14.6 03/28/2014   HCT 45.3 03/28/2014   MCV 94.0 03/28/2014   PLT 143 03/28/2014      Chemistry      Component Value Date/Time   NA 144 03/28/2014 0822   NA 141 12/12/2013 0626   K 4.6 03/28/2014 0822   K 4.6 12/12/2013 0626   CL 101 12/08/2013 1029   CO2 29 03/28/2014 0822   CO2 30 12/08/2013 1029   BUN 17.6 03/28/2014 0822   BUN 15 12/08/2013 1029   CREATININE 0.9 03/28/2014 0822   CREATININE 0.84 12/08/2013 1029      Component Value Date/Time   CALCIUM 9.3 03/28/2014 0822   CALCIUM 9.4 12/08/2013 1029   ALKPHOS 58 03/28/2014 0822   AST 14 03/28/2014 0822   ALT 14 03/28/2014 0822   BILITOT 0.20 03/28/2014 0822       RADIOGRAPHIC STUDIES: Ct Chest W Contrast  03/09/2014   CLINICAL DATA:  Followup lung cancer. Chemotherapy and radiation therapy  completed.  EXAM: CT CHEST WITH CONTRAST  TECHNIQUE: Multidetector CT imaging of the chest was performed during intravenous contrast administration.  CONTRAST:  35m OMNIPAQUE IOHEXOL 300 MG/ML  SOLN  COMPARISON:  PET 12/08/2013 and CT chest 12/06/2013, 01/23/2009.  FINDINGS: Mediastinal lymph nodes have decreased in size in the interval. Index lower right paratracheal lymph node measures 10 mm (previously 14 mm). Right hilar lymph node measures 1.5 cm (previously 2.5 cm). No axillary adenopathy. Scattered probable sebaceous cysts in the subcutaneous soft tissues. At least 2 vessel coronary artery calcification. Heart size normal. No pericardial effusion.  Mass in the apical segment right upper lobe has decreased in size in the interval, now measuring 1.6 x 3.0 cm (previously 3.8 x 3.9 cm). A separate area of nodular consolidation  seen the inferiorly within the right upper lobe measures 8 x 12 mm (previously 16 x 26 mm). Sub cm subpleural nodularity along the right hemidiaphragm, unchanged. 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 Scarring and volume loss in the lingula and left lower lobe. There is narrowing of the right-sided bronchi, worst to the right upper lobe.  Incidental imaging of the upper abdomen shows a 9 mm low-attenuation lesion in the inferior right hepatic lobe, unchanged from 01/23/2009. Visualized portions of the liver, gallbladder, adrenal glands, kidneys, spleen, pancreas, stomach and bowel are otherwise grossly unremarkable. No upper abdominal adenopathy. No worrisome lytic or sclerotic lesions. Degenerative changes are seen in the spine.  IMPRESSION: 1. Interval decrease in size of right upper lobe mass and mediastinal/right hilar adenopathy. Associated narrowing of right upper lobe bronchi. No evidence of distant metastatic disease. 2. Coronary artery calcification. 3. 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   Electronically Signed   By: MLorin PicketM.D.   On: 03/09/2014 11:31   ASSESSMENT AND PLAN: This is a very pleasant 63years old white male recently diagnosed with a stage IIIA non-small cell lung cancer, completed a course of concurrent chemoradiation with weekly carboplatin and paclitaxel is status post 7 cycle. He tolerated the first cycle of his treatment fairly well. Paclitaxel was discontinued secondary to hypersensitivity reaction at cycle #3. His recent CT scan of the chest showed significant improvement in his disease. The patient was discussed with also seen by Dr. MJulien Nordmann He'll proceed with day 1 cycle 1 of his consolidation chemotherapy with carboplatin and gemcitabine. The carboplatin will be at an AUC of 5 given on day 1 and gemcitabine 1000 mg per meter squared on days 1 and 8 every 3 weeks. We will plan to obtain laboratory studies  consisting of a CBC differential and C. met on treatment days only. She will followup in 3 weeks for another symptom management visit prior to the start of cycle #2. He was advised to followup with his either his primary care physician or his pulmonologist regarding his COPD as he is likely having a bit of a flare after his travels. Patient voiced understanding.  The patient was advised to call immediately if he has any concerning symptoms in the interval. He was advised to call immediately if he has any concerning symptoms in the interval. The patient voices understanding of current disease status and treatment options and is in agreement with the current care plan.  All questions were answered. The patient knows to call the clinic with any problems, questions or concerns. We can certainly see the patient much sooner if necessary.  [facility] ADDENDUM: Hematology/Oncology Attending:  I had a face to face encounter with the patient. I recommended his care plan. This is a very pleasant 63 years old white male with history of stage IIIa non-small cell lung cancer status post concurrent chemoradiation and he is currently undergoing consolidation chemotherapy with carboplatin and gemcitabine status post 1 cycle. He tolerated the first cycle of his treatment fairly well with no significant adverse effects. I recommended for him to proceed with cycle #2 today as scheduled. He would come back for followup visit in 3 weeks with the start of cycle #3. He was advised to call immediately if he has any concerning symptoms in the interval.  Disclaimer: This note was dictated with voice recognition software. Similar sounding words can inadvertently be transcribed and may be missed upon review. Eilleen Kempf., MD 03/31/2014

## 2014-03-29 ENCOUNTER — Telehealth: Payer: Self-pay | Admitting: Internal Medicine

## 2014-03-29 ENCOUNTER — Telehealth: Payer: Self-pay | Admitting: *Deleted

## 2014-03-29 NOTE — Telephone Encounter (Signed)
Called pt at home and spoke with wife Carlos Goodwin for post chemo follow up.  Per Carlos Goodwin, pt was doing fine except mild headache last pm.  Wife gave pt Tylenol with relief.  Per wife, pt slept well yesterday;  Good appetite and drinking lots of fluids as tolerated.  Bowel and bladder function fine.  Denied pain except mild headache last pm.  Carlos Goodwin aware of pt's next appts.  Carlos Goodwin also understood to call office with any new problems.

## 2014-03-29 NOTE — Telephone Encounter (Signed)
s.w. pt and advised on Aug and SEpt appt....pt ok and aware.Marland KitchenMarland Kitchenpt will pick up new sched at nxt visit

## 2014-03-30 NOTE — Patient Instructions (Signed)
Continue labs and chemotherapy as scheduled Follow up in 3 weeks with the start of your next cycle of chemotherapy

## 2014-04-04 ENCOUNTER — Ambulatory Visit: Payer: Medicare Other

## 2014-04-04 ENCOUNTER — Other Ambulatory Visit (HOSPITAL_BASED_OUTPATIENT_CLINIC_OR_DEPARTMENT_OTHER): Payer: Medicare Other

## 2014-04-04 DIAGNOSIS — C3410 Malignant neoplasm of upper lobe, unspecified bronchus or lung: Secondary | ICD-10-CM

## 2014-04-04 DIAGNOSIS — C3491 Malignant neoplasm of unspecified part of right bronchus or lung: Secondary | ICD-10-CM

## 2014-04-04 LAB — CBC WITH DIFFERENTIAL/PLATELET
BASO%: 2.7 % — AB (ref 0.0–2.0)
Basophils Absolute: 0 10*3/uL (ref 0.0–0.1)
EOS ABS: 0 10*3/uL (ref 0.0–0.5)
EOS%: 1.8 % (ref 0.0–7.0)
HCT: 44.5 % (ref 38.4–49.9)
HGB: 14.7 g/dL (ref 13.0–17.1)
LYMPH%: 22.5 % (ref 14.0–49.0)
MCH: 30.7 pg (ref 27.2–33.4)
MCHC: 33 g/dL (ref 32.0–36.0)
MCV: 93.3 fL (ref 79.3–98.0)
MONO#: 0 10*3/uL — ABNORMAL LOW (ref 0.1–0.9)
MONO%: 1.4 % (ref 0.0–14.0)
NEUT#: 1 10*3/uL — ABNORMAL LOW (ref 1.5–6.5)
NEUT%: 71.6 % (ref 39.0–75.0)
PLATELETS: 68 10*3/uL — AB (ref 140–400)
RBC: 4.77 10*6/uL (ref 4.20–5.82)
RDW: 21 % — AB (ref 11.0–14.6)
WBC: 1.4 10*3/uL — ABNORMAL LOW (ref 4.0–10.3)
lymph#: 0.3 10*3/uL — ABNORMAL LOW (ref 0.9–3.3)

## 2014-04-04 LAB — COMPREHENSIVE METABOLIC PANEL (CC13)
ALK PHOS: 70 U/L (ref 40–150)
ALT: 54 U/L (ref 0–55)
AST: 31 U/L (ref 5–34)
Albumin: 3.7 g/dL (ref 3.5–5.0)
Anion Gap: 9 mEq/L (ref 3–11)
BILIRUBIN TOTAL: 0.64 mg/dL (ref 0.20–1.20)
BUN: 24.4 mg/dL (ref 7.0–26.0)
CO2: 29 mEq/L (ref 22–29)
Calcium: 9.7 mg/dL (ref 8.4–10.4)
Chloride: 104 mEq/L (ref 98–109)
Creatinine: 0.8 mg/dL (ref 0.7–1.3)
GLUCOSE: 124 mg/dL (ref 70–140)
POTASSIUM: 4.8 meq/L (ref 3.5–5.1)
SODIUM: 141 meq/L (ref 136–145)
TOTAL PROTEIN: 6.9 g/dL (ref 6.4–8.3)

## 2014-04-11 ENCOUNTER — Other Ambulatory Visit: Payer: Medicare Other

## 2014-04-18 ENCOUNTER — Ambulatory Visit: Payer: Medicare Other

## 2014-04-18 ENCOUNTER — Telehealth: Payer: Self-pay | Admitting: Internal Medicine

## 2014-04-18 ENCOUNTER — Ambulatory Visit (HOSPITAL_BASED_OUTPATIENT_CLINIC_OR_DEPARTMENT_OTHER): Payer: Medicare Other | Admitting: Physician Assistant

## 2014-04-18 ENCOUNTER — Other Ambulatory Visit (HOSPITAL_BASED_OUTPATIENT_CLINIC_OR_DEPARTMENT_OTHER): Payer: Medicare Other

## 2014-04-18 ENCOUNTER — Encounter: Payer: Self-pay | Admitting: Physician Assistant

## 2014-04-18 DIAGNOSIS — D709 Neutropenia, unspecified: Secondary | ICD-10-CM

## 2014-04-18 DIAGNOSIS — D701 Agranulocytosis secondary to cancer chemotherapy: Secondary | ICD-10-CM

## 2014-04-18 DIAGNOSIS — C3410 Malignant neoplasm of upper lobe, unspecified bronchus or lung: Secondary | ICD-10-CM

## 2014-04-18 DIAGNOSIS — C3491 Malignant neoplasm of unspecified part of right bronchus or lung: Secondary | ICD-10-CM

## 2014-04-18 DIAGNOSIS — T451X5A Adverse effect of antineoplastic and immunosuppressive drugs, initial encounter: Secondary | ICD-10-CM

## 2014-04-18 LAB — COMPREHENSIVE METABOLIC PANEL (CC13)
ALT: 16 U/L (ref 0–55)
AST: 16 U/L (ref 5–34)
Albumin: 3.4 g/dL — ABNORMAL LOW (ref 3.5–5.0)
Alkaline Phosphatase: 68 U/L (ref 40–150)
Anion Gap: 10 mEq/L (ref 3–11)
BUN: 18.9 mg/dL (ref 7.0–26.0)
CALCIUM: 9.3 mg/dL (ref 8.4–10.4)
CHLORIDE: 104 meq/L (ref 98–109)
CO2: 27 meq/L (ref 22–29)
CREATININE: 0.9 mg/dL (ref 0.7–1.3)
Glucose: 113 mg/dl (ref 70–140)
Potassium: 4.7 mEq/L (ref 3.5–5.1)
Sodium: 141 mEq/L (ref 136–145)
Total Bilirubin: 0.37 mg/dL (ref 0.20–1.20)
Total Protein: 6.7 g/dL (ref 6.4–8.3)

## 2014-04-18 LAB — CBC WITH DIFFERENTIAL/PLATELET
BASO%: 0.9 % (ref 0.0–2.0)
Basophils Absolute: 0 10*3/uL (ref 0.0–0.1)
EOS%: 2.7 % (ref 0.0–7.0)
Eosinophils Absolute: 0 10*3/uL (ref 0.0–0.5)
HCT: 37.1 % — ABNORMAL LOW (ref 38.4–49.9)
HGB: 12.5 g/dL — ABNORMAL LOW (ref 13.0–17.1)
LYMPH%: 32.4 % (ref 14.0–49.0)
MCH: 31.3 pg (ref 27.2–33.4)
MCHC: 33.7 g/dL (ref 32.0–36.0)
MCV: 92.8 fL (ref 79.3–98.0)
MONO#: 0.3 10*3/uL (ref 0.1–0.9)
MONO%: 26.1 % — AB (ref 0.0–14.0)
NEUT%: 37.9 % — ABNORMAL LOW (ref 39.0–75.0)
NEUTROS ABS: 0.4 10*3/uL — AB (ref 1.5–6.5)
PLATELETS: 73 10*3/uL — AB (ref 140–400)
RBC: 4 10*6/uL — ABNORMAL LOW (ref 4.20–5.82)
RDW: 17.2 % — ABNORMAL HIGH (ref 11.0–14.6)
WBC: 1.1 10*3/uL — ABNORMAL LOW (ref 4.0–10.3)
lymph#: 0.4 10*3/uL — ABNORMAL LOW (ref 0.9–3.3)
nRBC: 3 % — ABNORMAL HIGH (ref 0–0)

## 2014-04-18 MED ORDER — TBO-FILGRASTIM 480 MCG/0.8ML ~~LOC~~ SOSY
480.0000 ug | PREFILLED_SYRINGE | Freq: Once | SUBCUTANEOUS | Status: AC
Start: 2014-04-18 — End: 2014-04-18
  Administered 2014-04-18: 480 ug via SUBCUTANEOUS
  Filled 2014-04-18: qty 0.8

## 2014-04-18 NOTE — Progress Notes (Addendum)
[place] [phone]   Fax:(336) [phone]  SHARED VISIT PROGRESS NOTE  Carlos Bogus, MD [place] [id number]   DIAGNOSIS: Stage IIIA (T2a, N2, M0) non-small cell lung cancer consistent with adenocarcinoma diagnosed in May of 2014, presented with right upper lobe lung mass in addition to mediastinal lymphadenopathy.  PRIOR THERAPY:  Concurrent chemoradiation with weekly carboplatin for AUC of 2 and paclitaxel 45 mg/M2, status post 7 cycle, with partial response. Paclitaxel was discontinued at cycle #3 secondary to hypersensitivity reaction.   CURRENT THERAPY: Systemic chemotherapy with carboplatin for AUC of 5 on day 1 and gemcitabine 1000 mg/M2 on days 1 and 8 every 3 weeks. First dose given 03/28/2014.  INTERVAL HISTORY: Carlos Goodwin 63 y.o. male returns to the clinic today for followup visit accompanied by his wife. He presents to proceed with cycle # 2 of his systemic chemotherapy with carboplatin and gemcitabine. He is feeling fine today with no specific complaints today. He would like a flu vaccine.  He continues to have shortness breath with exertion but no significant chest pain. He is using his inhalers mor regularly since he came back from his cruise. He denied having any significant fever or chills, no nausea or vomiting. He has no significant weight loss or night sweats.   MEDICAL HISTORY: Past Medical History  Diagnosis Date  . COPD (chronic obstructive pulmonary disease)   . Coronary artery disease   . Sleep apnea   . Hypertension   . High cholesterol   . MI (myocardial infarction) 2004  . GERD (gastroesophageal reflux disease)   . Family history of anesthesia complication     Daughter has nausea and vomiting  . Shortness of breath   . lung ca dx'd 10/2013    lungs x 3 spots    ALLERGIES:  is allergic to taxol; plavix; and other.  MEDICATIONS:  Current Outpatient Prescriptions  Medication Sig  Dispense Refill  . albuterol (PROVENTIL HFA;VENTOLIN HFA) 108 (90 BASE) MCG/ACT inhaler Inhale 1-2 puffs into the lungs every 6 (six) hours as needed for wheezing or shortness of breath.      Marland Kitchen aspirin EC 81 MG tablet Take 81 mg by mouth at bedtime.      . metoprolol (LOPRESSOR) 100 MG tablet Take 100 mg by mouth at bedtime.      . ranitidine (ZANTAC) 150 MG tablet Take 150 mg by mouth daily.      . simvastatin (ZOCOR) 40 MG tablet Take 40 mg by mouth every evening.      . tiotropium (SPIRIVA) 18 MCG inhalation capsule Place 18 mcg into inhaler and inhale daily.      . prochlorperazine (COMPAZINE) 10 MG tablet Take 1 tablet (10 mg total) by mouth every 6 (six) hours as needed for nausea or vomiting.  60 tablet  0  . Wound Cleansers (RADIAPLEX EX) Apply topically.       No current facility-administered medications for this visit.    SURGICAL HISTORY:  Past Surgical History  Procedure Laterality Date  . Appendectomy    . Coronary angioplasty  2004    1 stent  . Anterior cruciate ligament repair Right   . Tonsillectomy    . Prostate biopsy N/A 06/13/2013    Procedure: ULTRASOUND GUIDED PROSTATE BIOPSY;  Surgeon: Marissa Nestle, MD;  Location: [facility];  Service: Urology;  Laterality: N/A;  . Video bronchoscopy with endobronchial navigation N/A 12/12/2013  Procedure: VIDEO BRONCHOSCOPY WITH ENDOBRONCHIAL NAVIGATION;  Surgeon: Melrose Nakayama, MD;  Location: [place];  Service: Thoracic;  Laterality: N/A;  . Video bronchoscopy with endobronchial ultrasound N/A 12/12/2013    Procedure: VIDEO BRONCHOSCOPY WITH ENDOBRONCHIAL ULTRASOUND;  Surgeon: Melrose Nakayama, MD;  Location: [place];  Service: Thoracic;  Laterality: N/A;    REVIEW OF SYSTEMS:  Constitutional: positive for fatigue Eyes: negative Ears, nose, mouth, throat, and face: negative Respiratory: positive for cough and dyspnea on exertion Cardiovascular: negative Gastrointestinal:  negative Genitourinary:negative Integument/breast: negative Hematologic/lymphatic: negative Musculoskeletal:negative Neurological: negative Behavioral/Psych: negative Endocrine: negative Allergic/Immunologic: negative   PHYSICAL EXAMINATION: General appearance: alert, cooperative and no distress Head: Normocephalic, without obvious abnormality, atraumatic Neck: no adenopathy, no JVD, supple, symmetrical, trachea midline and thyroid not enlarged, symmetric, no tenderness/mass/nodules Lymph nodes: Cervical, supraclavicular, and axillary nodes normal. Resp: rhonchi bibasilar and wheezes bilaterally Back: symmetric, no curvature. ROM normal. No CVA tenderness. Cardio: regular rate and rhythm, S1, S2 normal, no murmur, click, rub or gallop GI: soft, non-tender; bowel sounds normal; no masses,  no organomegaly Extremities: extremities normal, atraumatic, no cyanosis or edema Neurologic: Alert and oriented X 3, normal strength and tone. Normal symmetric reflexes. Normal coordination and gait  ECOG PERFORMANCE STATUS: 1 - Symptomatic but completely ambulatory  There were no vitals taken for this visit.  LABORATORY DATA: Lab Results  Component Value Date   WBC 1.1* 04/18/2014   HGB 12.5* 04/18/2014   HCT 37.1* 04/18/2014   MCV 92.8 04/18/2014   PLT 73* 04/18/2014      Chemistry      Component Value Date/Time   NA 141 04/18/2014 0804   NA 141 12/12/2013 0626   K 4.7 04/18/2014 0804   K 4.6 12/12/2013 0626   CL 101 12/08/2013 1029   CO2 27 04/18/2014 0804   CO2 30 12/08/2013 1029   BUN 18.9 04/18/2014 0804   BUN 15 12/08/2013 1029   CREATININE 0.9 04/18/2014 0804   CREATININE 0.84 12/08/2013 1029      Component Value Date/Time   CALCIUM 9.3 04/18/2014 0804   CALCIUM 9.4 12/08/2013 1029   ALKPHOS 68 04/18/2014 0804   AST 16 04/18/2014 0804   ALT 16 04/18/2014 0804   BILITOT 0.37 04/18/2014 0804       RADIOGRAPHIC STUDIES: Ct Chest W Contrast  03/09/2014   CLINICAL DATA:  Followup lung cancer. Chemotherapy and  radiation therapy completed.  EXAM: CT CHEST WITH CONTRAST  TECHNIQUE: Multidetector CT imaging of the chest was performed during intravenous contrast administration.  CONTRAST:  65mL OMNIPAQUE IOHEXOL 300 MG/ML  SOLN  COMPARISON:  PET 12/08/2013 and CT chest 12/06/2013, 01/23/2009.  FINDINGS: Mediastinal lymph nodes have decreased in size in the interval. Index lower right paratracheal lymph node measures 10 mm (previously 14 mm). Right hilar lymph node measures 1.5 cm (previously 2.5 cm). No axillary adenopathy. Scattered probable sebaceous cysts in the subcutaneous soft tissues. At least 2 vessel coronary artery calcification. Heart size normal. No pericardial effusion.  Mass in the apical segment right upper lobe has decreased in size in the interval, now measuring 1.6 x 3.0 cm (previously 3.8 x 3.9 cm). A separate area of nodular consolidation seen the inferiorly within the right upper lobe measures 8 x 12 mm (previously 16 x 26 mm). Sub cm subpleural nodularity along the right hemidiaphragm, unchanged. 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 Scarring and volume loss in the lingula and left lower lobe. There is narrowing of the right-sided bronchi, worst to the  right upper lobe.  Incidental imaging of the upper abdomen shows a 9 mm low-attenuation lesion in the inferior right hepatic lobe, unchanged from 01/23/2009. Visualized portions of the liver, gallbladder, adrenal glands, kidneys, spleen, pancreas, stomach and bowel are otherwise grossly unremarkable. No upper abdominal adenopathy. No worrisome lytic or sclerotic lesions. Degenerative changes are seen in the spine.  IMPRESSION: 1. Interval decrease in size of right upper lobe mass and mediastinal/right hilar adenopathy. Associated narrowing of right upper lobe bronchi. No evidence of distant metastatic disease. 2. Coronary artery calcification. 3. 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    Electronically Signed   By: Lorin Picket M.D.   On: 03/09/2014 11:31   ASSESSMENT AND PLAN: This is a very pleasant 63 years old white male recently diagnosed with a stage IIIA non-small cell lung cancer, completed a course of concurrent chemoradiation with weekly carboplatin and paclitaxel is status post 7 cycle. He tolerated the first cycle of his treatment fairly well. Paclitaxel was discontinued secondary to hypersensitivity reaction at cycle #3. His recent CT scan of the chest showed significant improvement in his disease. The patient was discussed with also seen by Dr. Julien Nordmann. He'll proceed with day 1 cycle 1 of his consolidation chemotherapy with carboplatin and gemcitabine. The carboplatin will be at an AUC of 5 given on day 1 and gemcitabine 1000 mg per meter squared on days 1 and 8 every 3 weeks. Day 8 of cycle #1 was held secondary to neutropenia. He is neutropenic today with a total white count of 1.1 and ANC of 0.4 with platelet count of 73,000. We will reschedule the start of cycle #3 to 04/25/2014. We will also dose reduce the carboplatin to AUC of 4 and the gemcitabine to 800 mg/m2. He will be given Granix 480 mcg subcutaneously for 3 days beginning today for neutrophil support. Neutropenic precautions were reviewed with the patient and his wife and both voiced understanding. He will follow up in 4 weeks with the start of cycle #3 with dose reduced carboplatin and gemcitabine. His flu vaccine has been postponed until his blood counts have normalized.  The patient was advised to call immediately if he has any concerning symptoms in the interval. He was advised to call immediately if he has any concerning symptoms in the interval. The patient voices understanding of current disease status and treatment options and is in agreement with the current care plan.  All questions were answered. The patient knows to call the clinic with any problems, questions or concerns. We can certainly see the  patient much sooner if necessary.  Disclaimer: This note was dictated with voice recognition software. Similar sounding words can inadvertently be transcribed and may be missed upon review. Carlos Goodwin, TIPPIN, PA-C 04/18/2014  ADDENDUM: Hematology/Oncology Attending: I had a face to face encounter with the patient. I recommended his care plan. This is a very pleasant 63 years old white male with a stage IIIa non-small cell lung cancer currently undergoing systemic chemotherapy with carboplatin and gemcitabine status post 1 cycle. His first cycle of the chemotherapy was complicated with significant neutropenia and his absolute neutrophil count is still low.  I recommended for the patient to start treatment with Granix 480 mcg subcutaneously for 3 days. We will reschedule his chemotherapy to start cycle #2 next week, but I would reduce the dose of carboplatin to AUC of 4 and gemcitabine to 800 mg/M2 starting from cycle #2. He would come back  for followup visit in 4 weeks with the start of cycle #3. He was advised to call immediately if he has any concerning symptoms in the interval.  Disclaimer: This note was dictated with voice recognition software. Similar sounding words can inadvertently be transcribed and may be missed upon review. Eilleen Kempf., MD 04/18/2014

## 2014-04-18 NOTE — Telephone Encounter (Signed)
gv and printed appt sched and avs for pt for Sept and OCt...sed added tx...Marland Kitchenper pt MD told him not to do lab only.Marland KitchenMarland KitchenMarland Kitchen

## 2014-04-18 NOTE — Patient Instructions (Addendum)

## 2014-04-19 ENCOUNTER — Ambulatory Visit (HOSPITAL_BASED_OUTPATIENT_CLINIC_OR_DEPARTMENT_OTHER): Payer: Medicare Other

## 2014-04-19 VITALS — BP 143/68 | HR 86 | Temp 98.1°F

## 2014-04-19 DIAGNOSIS — D701 Agranulocytosis secondary to cancer chemotherapy: Secondary | ICD-10-CM

## 2014-04-19 DIAGNOSIS — D702 Other drug-induced agranulocytosis: Secondary | ICD-10-CM

## 2014-04-19 DIAGNOSIS — C3410 Malignant neoplasm of upper lobe, unspecified bronchus or lung: Secondary | ICD-10-CM

## 2014-04-19 DIAGNOSIS — T451X5A Adverse effect of antineoplastic and immunosuppressive drugs, initial encounter: Secondary | ICD-10-CM

## 2014-04-19 MED ORDER — TBO-FILGRASTIM 480 MCG/0.8ML ~~LOC~~ SOSY
480.0000 ug | PREFILLED_SYRINGE | Freq: Once | SUBCUTANEOUS | Status: AC
  Administered 2014-04-19: 480 ug via SUBCUTANEOUS
  Filled 2014-04-19: qty 0.8

## 2014-04-20 ENCOUNTER — Ambulatory Visit (HOSPITAL_BASED_OUTPATIENT_CLINIC_OR_DEPARTMENT_OTHER): Payer: Medicare Other

## 2014-04-20 VITALS — BP 136/60 | HR 96 | Temp 98.3°F

## 2014-04-20 DIAGNOSIS — C3410 Malignant neoplasm of upper lobe, unspecified bronchus or lung: Secondary | ICD-10-CM

## 2014-04-20 DIAGNOSIS — D702 Other drug-induced agranulocytosis: Secondary | ICD-10-CM

## 2014-04-20 DIAGNOSIS — D701 Agranulocytosis secondary to cancer chemotherapy: Secondary | ICD-10-CM

## 2014-04-20 DIAGNOSIS — T451X5A Adverse effect of antineoplastic and immunosuppressive drugs, initial encounter: Secondary | ICD-10-CM

## 2014-04-20 MED ORDER — TBO-FILGRASTIM 480 MCG/0.8ML ~~LOC~~ SOSY
480.0000 ug | PREFILLED_SYRINGE | Freq: Once | SUBCUTANEOUS | Status: AC
  Administered 2014-04-20: 480 ug via SUBCUTANEOUS
  Filled 2014-04-20: qty 0.8

## 2014-04-24 ENCOUNTER — Other Ambulatory Visit: Payer: Self-pay | Admitting: *Deleted

## 2014-04-25 ENCOUNTER — Other Ambulatory Visit: Payer: Self-pay | Admitting: Internal Medicine

## 2014-04-25 ENCOUNTER — Ambulatory Visit (HOSPITAL_BASED_OUTPATIENT_CLINIC_OR_DEPARTMENT_OTHER): Payer: Medicare Other

## 2014-04-25 ENCOUNTER — Other Ambulatory Visit (HOSPITAL_BASED_OUTPATIENT_CLINIC_OR_DEPARTMENT_OTHER): Payer: Medicare Other

## 2014-04-25 VITALS — BP 124/64 | HR 72 | Temp 98.0°F

## 2014-04-25 DIAGNOSIS — C3410 Malignant neoplasm of upper lobe, unspecified bronchus or lung: Secondary | ICD-10-CM

## 2014-04-25 DIAGNOSIS — C781 Secondary malignant neoplasm of mediastinum: Secondary | ICD-10-CM

## 2014-04-25 DIAGNOSIS — C3491 Malignant neoplasm of unspecified part of right bronchus or lung: Secondary | ICD-10-CM

## 2014-04-25 DIAGNOSIS — Z5111 Encounter for antineoplastic chemotherapy: Secondary | ICD-10-CM

## 2014-04-25 LAB — CBC WITH DIFFERENTIAL/PLATELET
BASO%: 0.9 % (ref 0.0–2.0)
Basophils Absolute: 0.1 10*3/uL (ref 0.0–0.1)
EOS%: 0.5 % (ref 0.0–7.0)
Eosinophils Absolute: 0 10*3/uL (ref 0.0–0.5)
HCT: 42.5 % (ref 38.4–49.9)
HGB: 13.9 g/dL (ref 13.0–17.1)
LYMPH#: 0.5 10*3/uL — AB (ref 0.9–3.3)
LYMPH%: 7.7 % — ABNORMAL LOW (ref 14.0–49.0)
MCH: 31.7 pg (ref 27.2–33.4)
MCHC: 32.7 g/dL (ref 32.0–36.0)
MCV: 97 fL (ref 79.3–98.0)
MONO#: 1 10*3/uL — AB (ref 0.1–0.9)
MONO%: 17.4 % — ABNORMAL HIGH (ref 0.0–14.0)
NEUT%: 73.5 % (ref 39.0–75.0)
NEUTROS ABS: 4.3 10*3/uL (ref 1.5–6.5)
Platelets: 144 10*3/uL (ref 140–400)
RBC: 4.38 10*6/uL (ref 4.20–5.82)
RDW: 18.3 % — AB (ref 11.0–14.6)
WBC: 5.8 10*3/uL (ref 4.0–10.3)
nRBC: 1 % — ABNORMAL HIGH (ref 0–0)

## 2014-04-25 LAB — COMPREHENSIVE METABOLIC PANEL (CC13)
ALBUMIN: 3.7 g/dL (ref 3.5–5.0)
ALK PHOS: 101 U/L (ref 40–150)
ALT: 17 U/L (ref 0–55)
AST: 16 U/L (ref 5–34)
Anion Gap: 11 mEq/L (ref 3–11)
BUN: 16.5 mg/dL (ref 7.0–26.0)
CO2: 27 mEq/L (ref 22–29)
Calcium: 9.4 mg/dL (ref 8.4–10.4)
Chloride: 103 mEq/L (ref 98–109)
Creatinine: 0.9 mg/dL (ref 0.7–1.3)
GLUCOSE: 106 mg/dL (ref 70–140)
POTASSIUM: 4.7 meq/L (ref 3.5–5.1)
SODIUM: 141 meq/L (ref 136–145)
TOTAL PROTEIN: 7.3 g/dL (ref 6.4–8.3)
Total Bilirubin: 0.34 mg/dL (ref 0.20–1.20)

## 2014-04-25 MED ORDER — ONDANSETRON 16 MG/50ML IVPB (CHCC)
INTRAVENOUS | Status: AC
  Filled 2014-04-25: qty 16

## 2014-04-25 MED ORDER — DEXAMETHASONE SODIUM PHOSPHATE 20 MG/5ML IJ SOLN
20.0000 mg | Freq: Once | INTRAMUSCULAR | Status: AC
  Administered 2014-04-25: 20 mg via INTRAVENOUS

## 2014-04-25 MED ORDER — SODIUM CHLORIDE 0.9 % IV SOLN
800.0000 mg/m2 | Freq: Once | INTRAVENOUS | Status: AC
  Administered 2014-04-25: 1976 mg via INTRAVENOUS
  Filled 2014-04-25: qty 51.97

## 2014-04-25 MED ORDER — ONDANSETRON 16 MG/50ML IVPB (CHCC)
16.0000 mg | Freq: Once | INTRAVENOUS | Status: AC
  Administered 2014-04-25: 16 mg via INTRAVENOUS

## 2014-04-25 MED ORDER — SODIUM CHLORIDE 0.9 % IV SOLN
600.0000 mg | Freq: Once | INTRAVENOUS | Status: AC
  Administered 2014-04-25: 600 mg via INTRAVENOUS
  Filled 2014-04-25: qty 60

## 2014-04-25 MED ORDER — SODIUM CHLORIDE 0.9 % IV SOLN
Freq: Once | INTRAVENOUS | Status: AC
  Administered 2014-04-25: 10:00:00 via INTRAVENOUS

## 2014-04-25 MED ORDER — DEXAMETHASONE SODIUM PHOSPHATE 20 MG/5ML IJ SOLN
INTRAMUSCULAR | Status: AC
  Filled 2014-04-25: qty 5

## 2014-04-25 NOTE — Patient Instructions (Signed)
[facility] Discharge Instructions for Patients Receiving Chemotherapy  Today you received the following chemotherapy agents gemzar/carboplatin.    To help prevent nausea and vomiting after your treatment, we encourage you to take your nausea medication as directed.    If you develop nausea and vomiting that is not controlled by your nausea medication, call the clinic.   BELOW ARE SYMPTOMS THAT SHOULD BE REPORTED IMMEDIATELY:  *FEVER GREATER THAN 100.5 F  *CHILLS WITH OR WITHOUT FEVER  NAUSEA AND VOMITING THAT IS NOT CONTROLLED WITH YOUR NAUSEA MEDICATION  *UNUSUAL SHORTNESS OF BREATH  *UNUSUAL BRUISING OR BLEEDING  TENDERNESS IN MOUTH AND THROAT WITH OR WITHOUT PRESENCE OF ULCERS  *URINARY PROBLEMS  *BOWEL PROBLEMS  UNUSUAL RASH Items with * indicate a potential emergency and should be followed up as soon as possible.  Feel free to call the clinic you have any questions or concerns. The clinic phone number is [phone].

## 2014-05-02 ENCOUNTER — Other Ambulatory Visit: Payer: Medicare Other

## 2014-05-09 ENCOUNTER — Ambulatory Visit: Payer: Medicare Other

## 2014-05-09 ENCOUNTER — Other Ambulatory Visit: Payer: Self-pay | Admitting: Internal Medicine

## 2014-05-09 ENCOUNTER — Other Ambulatory Visit (HOSPITAL_BASED_OUTPATIENT_CLINIC_OR_DEPARTMENT_OTHER): Payer: Medicare Other

## 2014-05-09 DIAGNOSIS — C3410 Malignant neoplasm of upper lobe, unspecified bronchus or lung: Secondary | ICD-10-CM

## 2014-05-09 DIAGNOSIS — C3491 Malignant neoplasm of unspecified part of right bronchus or lung: Secondary | ICD-10-CM

## 2014-05-09 LAB — CBC WITH DIFFERENTIAL/PLATELET
BASO%: 0.4 % (ref 0.0–2.0)
Basophils Absolute: 0 10*3/uL (ref 0.0–0.1)
EOS%: 1.6 % (ref 0.0–7.0)
Eosinophils Absolute: 0 10*3/uL (ref 0.0–0.5)
HCT: 34.8 % — ABNORMAL LOW (ref 38.4–49.9)
HGB: 11.6 g/dL — ABNORMAL LOW (ref 13.0–17.1)
LYMPH%: 14.3 % (ref 14.0–49.0)
MCH: 31.8 pg (ref 27.2–33.4)
MCHC: 33.3 g/dL (ref 32.0–36.0)
MCV: 95.3 fL (ref 79.3–98.0)
MONO#: 0.4 10*3/uL (ref 0.1–0.9)
MONO%: 14.7 % — ABNORMAL HIGH (ref 0.0–14.0)
NEUT#: 1.8 10*3/uL (ref 1.5–6.5)
NEUT%: 69 % (ref 39.0–75.0)
Platelets: 59 10*3/uL — ABNORMAL LOW (ref 140–400)
RBC: 3.65 10*6/uL — AB (ref 4.20–5.82)
RDW: 15.5 % — ABNORMAL HIGH (ref 11.0–14.6)
WBC: 2.6 10*3/uL — AB (ref 4.0–10.3)
lymph#: 0.4 10*3/uL — ABNORMAL LOW (ref 0.9–3.3)

## 2014-05-09 LAB — COMPREHENSIVE METABOLIC PANEL (CC13)
ALK PHOS: 80 U/L (ref 40–150)
ALT: 15 U/L (ref 0–55)
AST: 14 U/L (ref 5–34)
Albumin: 3.6 g/dL (ref 3.5–5.0)
Anion Gap: 6 mEq/L (ref 3–11)
BUN: 12.5 mg/dL (ref 7.0–26.0)
CO2: 30 mEq/L — ABNORMAL HIGH (ref 22–29)
Calcium: 9.5 mg/dL (ref 8.4–10.4)
Chloride: 104 mEq/L (ref 98–109)
Creatinine: 0.9 mg/dL (ref 0.7–1.3)
Glucose: 103 mg/dl (ref 70–140)
Potassium: 4.7 mEq/L (ref 3.5–5.1)
SODIUM: 141 meq/L (ref 136–145)
TOTAL PROTEIN: 6.9 g/dL (ref 6.4–8.3)
Total Bilirubin: 0.35 mg/dL (ref 0.20–1.20)

## 2014-05-09 NOTE — Progress Notes (Signed)
Patient here for chemotherapy.  Platelets are 59K.   VO from Dr. Julien Nordmann to hold chemo today and return in one week.  Patient already scheduled for 05/16/14.  Discussed thrombocytopenia with patient and he understands precautions.  Per Dr. Julien Nordmann:  It is ok to continue with his asprin 81mg  daily as this is a heart medication.Marland Kitchen  Pt. Given copy of his labs to take home.

## 2014-05-16 ENCOUNTER — Ambulatory Visit (HOSPITAL_BASED_OUTPATIENT_CLINIC_OR_DEPARTMENT_OTHER): Payer: Medicare Other | Admitting: Internal Medicine

## 2014-05-16 ENCOUNTER — Telehealth: Payer: Self-pay | Admitting: Internal Medicine

## 2014-05-16 ENCOUNTER — Ambulatory Visit: Payer: Medicare Other

## 2014-05-16 ENCOUNTER — Other Ambulatory Visit (HOSPITAL_BASED_OUTPATIENT_CLINIC_OR_DEPARTMENT_OTHER): Payer: Medicare Other

## 2014-05-16 ENCOUNTER — Other Ambulatory Visit: Payer: Medicare Other

## 2014-05-16 ENCOUNTER — Encounter: Payer: Self-pay | Admitting: Internal Medicine

## 2014-05-16 VITALS — BP 114/63 | HR 75 | Temp 98.7°F | Resp 21 | Ht 70.0 in | Wt 285.4 lb

## 2014-05-16 DIAGNOSIS — C781 Secondary malignant neoplasm of mediastinum: Secondary | ICD-10-CM

## 2014-05-16 DIAGNOSIS — C3491 Malignant neoplasm of unspecified part of right bronchus or lung: Secondary | ICD-10-CM

## 2014-05-16 DIAGNOSIS — C3411 Malignant neoplasm of upper lobe, right bronchus or lung: Secondary | ICD-10-CM

## 2014-05-16 LAB — CBC WITH DIFFERENTIAL/PLATELET
BASO%: 0.6 % (ref 0.0–2.0)
Basophils Absolute: 0 10*3/uL (ref 0.0–0.1)
EOS%: 2.8 % (ref 0.0–7.0)
Eosinophils Absolute: 0.1 10*3/uL (ref 0.0–0.5)
HCT: 36.1 % — ABNORMAL LOW (ref 38.4–49.9)
HGB: 11.7 g/dL — ABNORMAL LOW (ref 13.0–17.1)
LYMPH%: 20.4 % (ref 14.0–49.0)
MCH: 32.3 pg (ref 27.2–33.4)
MCHC: 32.4 g/dL (ref 32.0–36.0)
MCV: 99.7 fL — ABNORMAL HIGH (ref 79.3–98.0)
MONO#: 0.4 10*3/uL (ref 0.1–0.9)
MONO%: 20.4 % — ABNORMAL HIGH (ref 0.0–14.0)
NEUT#: 1 10*3/uL — ABNORMAL LOW (ref 1.5–6.5)
NEUT%: 55.8 % (ref 39.0–75.0)
PLATELETS: 140 10*3/uL (ref 140–400)
RBC: 3.62 10*6/uL — ABNORMAL LOW (ref 4.20–5.82)
RDW: 17.1 % — ABNORMAL HIGH (ref 11.0–14.6)
WBC: 1.8 10*3/uL — ABNORMAL LOW (ref 4.0–10.3)
lymph#: 0.4 10*3/uL — ABNORMAL LOW (ref 0.9–3.3)
nRBC: 2 % — ABNORMAL HIGH (ref 0–0)

## 2014-05-16 LAB — COMPREHENSIVE METABOLIC PANEL (CC13)
ALK PHOS: 73 U/L (ref 40–150)
ALT: 16 U/L (ref 0–55)
AST: 16 U/L (ref 5–34)
Albumin: 3.4 g/dL — ABNORMAL LOW (ref 3.5–5.0)
Anion Gap: 7 mEq/L (ref 3–11)
BUN: 15.7 mg/dL (ref 7.0–26.0)
CHLORIDE: 105 meq/L (ref 98–109)
CO2: 29 mEq/L (ref 22–29)
CREATININE: 0.9 mg/dL (ref 0.7–1.3)
Calcium: 9 mg/dL (ref 8.4–10.4)
Glucose: 109 mg/dl (ref 70–140)
Potassium: 4.3 mEq/L (ref 3.5–5.1)
Sodium: 141 mEq/L (ref 136–145)
Total Bilirubin: 0.26 mg/dL (ref 0.20–1.20)
Total Protein: 6.5 g/dL (ref 6.4–8.3)

## 2014-05-16 NOTE — Progress Notes (Signed)
Carlos [phone]   [phone]  OFFICE PROGRESS NOTE  Carlos Bogus, MD [place] [address]   DIAGNOSIS: Stage IIIA (T2a, N2, M0) non-small cell lung cancer consistent with adenocarcinoma diagnosed in May of 2014, presented with right upper lobe lung mass in addition to mediastinal lymphadenopathy.  PRIOR THERAPY:  Concurrent chemoradiation with weekly carboplatin for AUC of 2 and paclitaxel 45 mg/M2, status post 7 cycle, with partial response. Paclitaxel was discontinued at cycle #3 secondary to hypersensitivity reaction.   CURRENT THERAPY: Systemic chemotherapy with carboplatin for AUC of 5 on day 1 and gemcitabine 1000 mg/M2 on days 1 and 8 every 3 weeks. Status post 2 cycles. First dose 03/28/2014.  INTERVAL HISTORY: Carlos Goodwin 63 y.o. male returns to the clinic today for followup visit accompanied by his wife. He is feeling fine today except for the shortness of breath.  He denied having any significant chest pain, or hemoptysis. He has cough productive of whitish sputum. He denied having any significant fever or chills, no nausea or vomiting. He has no significant weight loss or night sweats. He has several delays and missed treatment secondary to pancytopenia. He is here today for evaluation and discussion of his treatment options  MEDICAL HISTORY: Past Medical History  Diagnosis Date  . COPD (chronic obstructive pulmonary disease)   . Coronary artery disease   . Sleep apnea   . Hypertension   . High cholesterol   . MI (myocardial infarction) 2004  . GERD (gastroesophageal reflux disease)   . Family history of anesthesia complication     Daughter has nausea and vomiting  . Shortness of breath   . lung ca dx'd 10/2013    lungs x 3 spots    ALLERGIES:  is allergic to taxol; plavix; and other.  MEDICATIONS:  Current Outpatient Prescriptions  Medication Sig Dispense Refill  . albuterol  (PROVENTIL HFA;VENTOLIN HFA) 108 (90 BASE) MCG/ACT inhaler Inhale 1-2 puffs into the lungs every 6 (six) hours as needed for wheezing or shortness of breath.      Marland Kitchen aspirin EC 81 MG tablet Take 81 mg by mouth at bedtime.      . metoprolol (LOPRESSOR) 100 MG tablet Take 100 mg by mouth at bedtime.      . ranitidine (ZANTAC) 150 MG tablet Take 150 mg by mouth daily.      . simvastatin (ZOCOR) 40 MG tablet Take 40 mg by mouth every evening.      . tiotropium (SPIRIVA) 18 MCG inhalation capsule Place 18 mcg into inhaler and inhale daily.      . prochlorperazine (COMPAZINE) 10 MG tablet Take 1 tablet (10 mg total) by mouth every 6 (six) hours as needed for nausea or vomiting.  60 tablet  0  . Wound Cleansers (RADIAPLEX EX) Apply topically.       No current facility-administered medications for this visit.    SURGICAL HISTORY:  Past Surgical History  Procedure Laterality Date  . Appendectomy    . Coronary angioplasty  2004    1 stent  . Anterior cruciate ligament repair Right   . Tonsillectomy    . Prostate biopsy N/A 06/13/2013    Procedure: ULTRASOUND GUIDED PROSTATE BIOPSY;  Surgeon: Marissa Nestle, MD;  Location: [facility];  Service: Urology;  Laterality: N/A;  . Video bronchoscopy with endobronchial navigation N/A 12/12/2013    Procedure: VIDEO BRONCHOSCOPY WITH ENDOBRONCHIAL NAVIGATION;  Surgeon: Remo Lipps  Chaya Jan, MD;  Location: [facility];  Service: Thoracic;  Laterality: N/A;  . Video bronchoscopy with endobronchial ultrasound N/A 12/12/2013    Procedure: VIDEO BRONCHOSCOPY WITH ENDOBRONCHIAL ULTRASOUND;  Surgeon: Melrose Nakayama, MD;  Location: [facility];  Service: Thoracic;  Laterality: N/A;    REVIEW OF SYSTEMS:  Constitutional: positive for fatigue Eyes: negative Ears, nose, mouth, throat, and face: negative Respiratory: positive for cough and dyspnea on exertion Cardiovascular: negative Gastrointestinal: negative Genitourinary:negative Integument/breast:  negative Hematologic/lymphatic: negative Musculoskeletal:negative Neurological: negative Behavioral/Psych: negative Endocrine: negative Allergic/Immunologic: negative   PHYSICAL EXAMINATION: General appearance: alert, cooperative and no distress Head: Normocephalic, without obvious abnormality, atraumatic Neck: no adenopathy, no JVD, supple, symmetrical, trachea midline and thyroid not enlarged, symmetric, no tenderness/mass/nodules Lymph nodes: Cervical, supraclavicular, and axillary nodes normal. Resp: clear to auscultation bilaterally Back: symmetric, no curvature. ROM normal. No CVA tenderness. Cardio: regular rate and rhythm, S1, S2 normal, no murmur, click, rub or gallop GI: soft, non-tender; bowel sounds normal; no masses,  no organomegaly Extremities: extremities normal, atraumatic, no cyanosis or edema Neurologic: Alert and oriented X 3, normal strength and tone. Normal symmetric reflexes. Normal coordination and gait  ECOG PERFORMANCE STATUS: 1 - Symptomatic but completely ambulatory  Blood pressure 114/63, pulse 75, temperature 98.7 F (37.1 C), temperature source Oral, resp. rate 21, height 5\' 10"  (1.778 m), weight 285 lb 6.4 oz (129.457 kg), SpO2 96.00%.  LABORATORY DATA: Lab Results  Component Value Date   WBC 1.8* 05/16/2014   HGB 11.7* 05/16/2014   HCT 36.1* 05/16/2014   MCV 99.7* 05/16/2014   PLT 140 05/16/2014      Chemistry      Component Value Date/Time   NA 141 05/09/2014 0940   NA 141 12/12/2013 0626   K 4.7 05/09/2014 0940   K 4.6 12/12/2013 0626   CL 101 12/08/2013 1029   CO2 30* 05/09/2014 0940   CO2 30 12/08/2013 1029   BUN 12.5 05/09/2014 0940   BUN 15 12/08/2013 1029   CREATININE 0.9 05/09/2014 0940   CREATININE 0.84 12/08/2013 1029      Component Value Date/Time   CALCIUM 9.5 05/09/2014 0940   CALCIUM 9.4 12/08/2013 1029   ALKPHOS 80 05/09/2014 0940   AST 14 05/09/2014 0940   ALT 15 05/09/2014 0940   BILITOT 0.35 05/09/2014 0940       RADIOGRAPHIC  STUDIES:  ASSESSMENT AND PLAN: This is a very pleasant 63 years old white male recently diagnosed with a stage IIIA non-small cell lung cancer, completed a course of concurrent chemoradiation with weekly carboplatin and paclitaxel is status post 7 cycle. He tolerated the first cycle of his treatment fairly well. Paclitaxel was discontinued secondary to hypersensitivity reaction at cycle #3. He was started recently on systemic chemotherapy with carboplatin and gemcitabine but unfortunately had significant pancytopenia and he has several delays as well as missed treatment because of the pancytopenia. His absolute neutrophil count is low today. I recommended for the patient to have repeat CT scan of the chest for evaluation of his disease at this point. If she has any evidence for disease progression then we'll discontinue treatment was carboplatin and gemcitabine and consider the patient for immunotherapy with Nivolumab. For the shortness of breath, we will check his oxygen saturation today and if needed I will arrange for the patient to have home oxygen. He was also advised to continue with his inhaler as prescribed by his pulmonologist. He would come back for followup visit in one week for evaluation  and discussion of future treatment options based on the CT scan results. The patient was advised to call immediately if he has any concerning symptoms in the interval. The patient voices understanding of current disease status and treatment options and is in agreement with the current care plan.  All questions were answered. The patient knows to call the clinic with any problems, questions or concerns. We can certainly see the patient much sooner if necessary.  Disclaimer: This note was dictated with voice recognition software. Similar sounding words can inadvertently be transcribed and may not be corrected upon review.

## 2014-05-16 NOTE — Patient Instructions (Signed)
Smoking Cessation, Tips for Success  If you are ready to quit smoking, congratulations! You have chosen to help yourself be healthier. Cigarettes bring nicotine, tar, carbon monoxide, and other irritants into your body. Your lungs, heart, and blood vessels will be able to work better without these poisons. There are many different ways to quit smoking. Nicotine gum, nicotine patches, a nicotine inhaler, or nicotine nasal spray can help with physical craving. Hypnosis, support groups, and medicines help break the habit of smoking.  WHAT THINGS CAN I DO TO MAKE QUITTING EASIER?   Here are some tips to help you quit for good:  · Pick a date when you will quit smoking completely. Tell all of your friends and family about your plan to quit on that date.  · Do not try to slowly cut down on the number of cigarettes you are smoking. Pick a quit date and quit smoking completely starting on that day.  · Throw away all cigarettes.    · Clean and remove all ashtrays from your home, work, and car.  · On a card, write down your reasons for quitting. Carry the card with you and read it when you get the urge to smoke.  · Cleanse your body of nicotine. Drink enough water and fluids to keep your urine clear or pale yellow. Do this after quitting to flush the nicotine from your body.  · Learn to predict your moods. Do not let a bad situation be your excuse to have a cigarette. Some situations in your life might tempt you into wanting a cigarette.  · Never have "just one" cigarette. It leads to wanting another and another. Remind yourself of your decision to quit.  · Change habits associated with smoking. If you smoked while driving or when feeling stressed, try other activities to replace smoking. Stand up when drinking your coffee. Brush your teeth after eating. Sit in a different chair when you read the paper. Avoid alcohol while trying to quit, and try to drink fewer caffeinated beverages. Alcohol and caffeine may urge you to  smoke.  · Avoid foods and drinks that can trigger a desire to smoke, such as sugary or spicy foods and alcohol.  · Ask people who smoke not to smoke around you.  · Have something planned to do right after eating or having a cup of coffee. For example, plan to take a walk or exercise.  · Try a relaxation exercise to calm you down and decrease your stress. Remember, you may be tense and nervous for the first 2 weeks after you quit, but this will pass.  · Find new activities to keep your hands busy. Play with a pen, coin, or rubber band. Doodle or draw things on paper.  · Brush your teeth right after eating. This will help cut down on the craving for the taste of tobacco after meals. You can also try mouthwash.    · Use oral substitutes in place of cigarettes. Try using lemon drops, carrots, cinnamon sticks, or chewing gum. Keep them handy so they are available when you have the urge to smoke.  · When you have the urge to smoke, try deep breathing.  · Designate your home as a nonsmoking area.  · If you are a heavy smoker, ask your health care provider about a prescription for nicotine chewing gum. It can ease your withdrawal from nicotine.  · Reward yourself. Set aside the cigarette money you save and buy yourself something nice.  · Look for   support from others. Join a support group or smoking cessation program. Ask someone at home or at work to help you with your plan to quit smoking.  · Always ask yourself, "Do I need this cigarette or is this just a reflex?" Tell yourself, "Today, I choose not to smoke," or "I do not want to smoke." You are reminding yourself of your decision to quit.  · Do not replace cigarette smoking with electronic cigarettes (commonly called e-cigarettes). The safety of e-cigarettes is unknown, and some may contain harmful chemicals.  · If you relapse, do not give up! Plan ahead and think about what you will do the next time you get the urge to smoke.  HOW WILL I FEEL WHEN I QUIT SMOKING?  You  may have symptoms of withdrawal because your body is used to nicotine (the addictive substance in cigarettes). You may crave cigarettes, be irritable, feel very hungry, cough often, get headaches, or have difficulty concentrating. The withdrawal symptoms are only temporary. They are strongest when you first quit but will go away within 10-14 days. When withdrawal symptoms occur, stay in control. Think about your reasons for quitting. Remind yourself that these are signs that your body is healing and getting used to being without cigarettes. Remember that withdrawal symptoms are easier to treat than the major diseases that smoking can cause.   Even after the withdrawal is over, expect periodic urges to smoke. However, these cravings are generally short lived and will go away whether you smoke or not. Do not smoke!  WHAT RESOURCES ARE AVAILABLE TO HELP ME QUIT SMOKING?  Your health care provider can direct you to community resources or hospitals for support, which may include:  · Group support.  · Education.  · Hypnosis.  · Therapy.  Document Released: 04/24/2004 Document Revised: 12/11/2013 Document Reviewed: 01/12/2013  [employer]® Patient Information ©2015 [employer]. This information is not intended to replace advice given to you by your health care provider. Make sure you discuss any questions you have with your health care provider.

## 2014-05-16 NOTE — Telephone Encounter (Signed)
Pt confirmed labs/ov per 10/07 POF, gave pt AVS.... KJ

## 2014-05-21 ENCOUNTER — Ambulatory Visit (HOSPITAL_COMMUNITY)
Admission: RE | Admit: 2014-05-21 | Discharge: 2014-05-21 | Disposition: A | Discharge status: Home / Self Care | Payer: Medicare Other | Source: Ambulatory Visit | Attending: Internal Medicine | Admitting: Internal Medicine

## 2014-05-21 DIAGNOSIS — R05 Cough: Secondary | ICD-10-CM | POA: Insufficient documentation

## 2014-05-21 DIAGNOSIS — C3491 Malignant neoplasm of unspecified part of right bronchus or lung: Secondary | ICD-10-CM | POA: Insufficient documentation

## 2014-05-21 MED ORDER — IOHEXOL 300 MG/ML  SOLN
80.0000 mL | Freq: Once | INTRAMUSCULAR | Status: AC | PRN
  Administered 2014-05-21: 80 mL via INTRAVENOUS

## 2014-05-23 ENCOUNTER — Telehealth: Payer: Self-pay | Admitting: Internal Medicine

## 2014-05-23 ENCOUNTER — Encounter: Payer: Self-pay | Admitting: Physician Assistant

## 2014-05-23 ENCOUNTER — Other Ambulatory Visit (HOSPITAL_BASED_OUTPATIENT_CLINIC_OR_DEPARTMENT_OTHER): Payer: Medicare Other

## 2014-05-23 ENCOUNTER — Ambulatory Visit (HOSPITAL_BASED_OUTPATIENT_CLINIC_OR_DEPARTMENT_OTHER): Payer: Medicare Other | Admitting: Physician Assistant

## 2014-05-23 VITALS — BP 145/72 | HR 74 | Temp 98.4°F | Resp 18 | Ht 70.0 in | Wt 278.1 lb

## 2014-05-23 DIAGNOSIS — C3491 Malignant neoplasm of unspecified part of right bronchus or lung: Secondary | ICD-10-CM

## 2014-05-23 DIAGNOSIS — C781 Secondary malignant neoplasm of mediastinum: Secondary | ICD-10-CM

## 2014-05-23 DIAGNOSIS — C3411 Malignant neoplasm of upper lobe, right bronchus or lung: Secondary | ICD-10-CM

## 2014-05-23 LAB — COMPREHENSIVE METABOLIC PANEL (CC13)
ALT: 15 U/L (ref 0–55)
ANION GAP: 8 meq/L (ref 3–11)
AST: 14 U/L (ref 5–34)
Albumin: 3.6 g/dL (ref 3.5–5.0)
Alkaline Phosphatase: 76 U/L (ref 40–150)
BUN: 19.1 mg/dL (ref 7.0–26.0)
CALCIUM: 9.6 mg/dL (ref 8.4–10.4)
CHLORIDE: 104 meq/L (ref 98–109)
CO2: 30 meq/L — AB (ref 22–29)
CREATININE: 0.9 mg/dL (ref 0.7–1.3)
Glucose: 112 mg/dl (ref 70–140)
POTASSIUM: 4.9 meq/L (ref 3.5–5.1)
Sodium: 142 mEq/L (ref 136–145)
Total Bilirubin: 0.36 mg/dL (ref 0.20–1.20)
Total Protein: 6.9 g/dL (ref 6.4–8.3)

## 2014-05-23 LAB — CBC WITH DIFFERENTIAL/PLATELET
BASO%: 0.9 % (ref 0.0–2.0)
BASOS ABS: 0 10*3/uL (ref 0.0–0.1)
EOS ABS: 0.1 10*3/uL (ref 0.0–0.5)
EOS%: 1.6 % (ref 0.0–7.0)
HCT: 42.3 % (ref 38.4–49.9)
HEMOGLOBIN: 13.6 g/dL (ref 13.0–17.1)
LYMPH%: 10.7 % — ABNORMAL LOW (ref 14.0–49.0)
MCH: 32.1 pg (ref 27.2–33.4)
MCHC: 32.2 g/dL (ref 32.0–36.0)
MCV: 99.8 fL — AB (ref 79.3–98.0)
MONO#: 0.5 10*3/uL (ref 0.1–0.9)
MONO%: 11.8 % (ref 0.0–14.0)
NEUT#: 3.3 10*3/uL (ref 1.5–6.5)
NEUT%: 75 % (ref 39.0–75.0)
NRBC: 0 % (ref 0–0)
Platelets: 163 10*3/uL (ref 140–400)
RBC: 4.24 10*6/uL (ref 4.20–5.82)
RDW: 16.7 % — AB (ref 11.0–14.6)
WBC: 4.4 10*3/uL (ref 4.0–10.3)
lymph#: 0.5 10*3/uL — ABNORMAL LOW (ref 0.9–3.3)

## 2014-05-23 NOTE — Telephone Encounter (Signed)
gv adn printed appt sched and avs fo rpt for OCT and NOV...sed added tx.

## 2014-05-23 NOTE — Progress Notes (Addendum)
[facility] Telephone:([phone]) [phone]   [phone]) [phone]  OFFICE PROGRESS NOTE  Alonza Bogus, MD [place] [address]   DIAGNOSIS: Stage IIIA (T2a, N2, M0) non-small cell lung cancer consistent with adenocarcinoma diagnosed in May of 2014, presented with right upper lobe lung mass in addition to mediastinal lymphadenopathy.  PRIOR THERAPY:  Concurrent chemoradiation with weekly carboplatin for AUC of 2 and paclitaxel 45 mg/M2, status post 7 cycle, with partial response. Paclitaxel was discontinued at cycle #3 secondary to hypersensitivity reaction.   CURRENT THERAPY: Systemic chemotherapy with carboplatin for AUC of 5 on day 1 and gemcitabine 1000 mg/M2 on days 1 and 8 every 3 weeks. Status post 2 cycles. First dose 03/28/2014.  INTERVAL HISTORY: TISHAWN FRIEDHOFF 63 y.o. male returns to the clinic today for followup visit accompanied by his wife. He is feeling fine today except for the shortness of breath.  He denied having any significant chest pain, or hemoptysis. He has cough productive of whitish sputum. He denied having any significant fever or chills, no nausea or vomiting. He has no significant weight loss or night sweats. He has several delays and missed treatment secondary to pancytopenia. He recently had a restaging CT scan of the chest to reevaluate his disease and presents to discuss results.   MEDICAL HISTORY: Past Medical History  Diagnosis Date  . COPD (chronic obstructive pulmonary disease)   . Coronary artery disease   . Sleep apnea   . Hypertension   . High cholesterol   . MI (myocardial infarction) 2004  . GERD (gastroesophageal reflux disease)   . Family history of anesthesia complication     Daughter has nausea and vomiting  . Shortness of breath   . lung ca dx'd 10/2013    lungs x 3 spots    ALLERGIES:  is allergic to taxol; plavix; and other.  MEDICATIONS:  Current Outpatient Prescriptions  Medication  Sig Dispense Refill  . albuterol (PROVENTIL HFA;VENTOLIN HFA) 108 (90 BASE) MCG/ACT inhaler Inhale 1-2 puffs into the lungs every 6 (six) hours as needed for wheezing or shortness of breath.      Marland Kitchen aspirin EC 81 MG tablet Take 81 mg by mouth at bedtime.      . metoprolol (LOPRESSOR) 100 MG tablet Take 100 mg by mouth at bedtime.      . ranitidine (ZANTAC) 150 MG tablet Take 150 mg by mouth daily.      . simvastatin (ZOCOR) 40 MG tablet Take 40 mg by mouth every evening.      . tiotropium (SPIRIVA) 18 MCG inhalation capsule Place 18 mcg into inhaler and inhale daily.      . prochlorperazine (COMPAZINE) 10 MG tablet Take 1 tablet (10 mg total) by mouth every 6 (six) hours as needed for nausea or vomiting.  60 tablet  0  . Wound Cleansers (RADIAPLEX EX) Apply topically.       No current facility-administered medications for this visit.    SURGICAL HISTORY:  Past Surgical History  Procedure Laterality Date  . Appendectomy    . Coronary angioplasty  2004    1 stent  . Anterior cruciate ligament repair Right   . Tonsillectomy    . Prostate biopsy N/A 06/13/2013    Procedure: ULTRASOUND GUIDED PROSTATE BIOPSY;  Surgeon: Marissa Nestle, MD;  Location: [facility];  Service: Urology;  Laterality: N/A;  . Video bronchoscopy with endobronchial navigation N/A 12/12/2013    Procedure:  VIDEO BRONCHOSCOPY WITH ENDOBRONCHIAL NAVIGATION;  Surgeon: Melrose Nakayama, MD;  Location: [facility];  Service: Thoracic;  Laterality: N/A;  . Video bronchoscopy with endobronchial ultrasound N/A 12/12/2013    Procedure: VIDEO BRONCHOSCOPY WITH ENDOBRONCHIAL ULTRASOUND;  Surgeon: Melrose Nakayama, MD;  Location: [facility];  Service: Thoracic;  Laterality: N/A;    REVIEW OF SYSTEMS:  Constitutional: positive for fatigue Eyes: negative Ears, nose, mouth, throat, and face: negative Respiratory: positive for cough and dyspnea on exertion Cardiovascular: negative Gastrointestinal:  negative Genitourinary:negative Integument/breast: negative Hematologic/lymphatic: negative Musculoskeletal:negative Neurological: negative Behavioral/Psych: negative Endocrine: negative Allergic/Immunologic: negative   PHYSICAL EXAMINATION: General appearance: alert, cooperative and no distress Head: Normocephalic, without obvious abnormality, atraumatic Neck: no adenopathy, no JVD, supple, symmetrical, trachea midline and thyroid not enlarged, symmetric, no tenderness/mass/nodules Lymph nodes: Cervical, supraclavicular, and axillary nodes normal. Resp: clear to auscultation bilaterally Back: symmetric, no curvature. ROM normal. No CVA tenderness. Cardio: regular rate and rhythm, S1, S2 normal, no murmur, click, rub or gallop GI: soft, non-tender; bowel sounds normal; no masses,  no organomegaly Extremities: extremities normal, atraumatic, no cyanosis or edema Neurologic: Alert and oriented X 3, normal strength and tone. Normal symmetric reflexes. Normal coordination and gait  ECOG PERFORMANCE STATUS: 1 - Symptomatic but completely ambulatory  Blood pressure 145/72, pulse 74, temperature 98.4 F (36.9 C), temperature source Oral, resp. rate 18, height 5\' 10"  (1.778 m), weight 278 lb 1.6 oz (126.145 kg), SpO2 93.00%.  LABORATORY DATA: Lab Results  Component Value Date   WBC 4.4 05/23/2014   HGB 13.6 05/23/2014   HCT 42.3 05/23/2014   MCV 99.8* 05/23/2014   PLT 163 05/23/2014      Chemistry      Component Value Date/Time   NA 142 05/23/2014 0954   NA 141 12/12/2013 0626   K 4.9 05/23/2014 0954   K 4.6 12/12/2013 0626   CL 101 12/08/2013 1029   CO2 30* 05/23/2014 0954   CO2 30 12/08/2013 1029   BUN 19.1 05/23/2014 0954   BUN 15 12/08/2013 1029   CREATININE 0.9 05/23/2014 0954   CREATININE 0.84 12/08/2013 1029      Component Value Date/Time   CALCIUM 9.6 05/23/2014 0954   CALCIUM 9.4 12/08/2013 1029   ALKPHOS 76 05/23/2014 0954   AST 14 05/23/2014 0954   ALT 15 05/23/2014 0954    BILITOT 0.36 05/23/2014 0954       RADIOGRAPHIC STUDIES: Ct Chest W Contrast  05/21/2014   CLINICAL DATA:  Cough, lung cancer restaging, completed chemotherapy and radiation therapy July 2015  EXAM: CT CHEST WITH CONTRAST  TECHNIQUE: Multidetector CT imaging of the chest was performed during intravenous contrast administration.  CONTRAST:  72mL OMNIPAQUE IOHEXOL 300 MG/ML  SOLN  COMPARISON:  03/09/2014  FINDINGS: Irregular right upper lobe pleural parenchymal mass is smaller, now 2.9 x 1.1 cm image 11. Subpleural 3 mm right upper lobe pulmonary nodule image 15 is stable. 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 Small pleural effusions are increased from previously with minimal associated curvilinear atelectasis. Contiguous right upper lobe bronchial wall thickening extending to the right hilum is reidentified, with persistent mild narrowing of the right upper lobe bronchus. Patchy areas of curvilinear opacity are reidentified in a subpleural location bilaterally, not significantly changed. No new suspicious mass, nodule, or consolidation is identified.  Too small to characterize 5 mm right hepatic lobe hypodense lesion image 72 stable. Heart size is normal. Slight decrease in size of right hilar lymph nodes with conglomerate nodes  measuring 1.2 cm in short axis diameter compared to 1.6 cm previously, current image 30. Small pretracheal nodes are stable. Left anterior chest wall probable sebaceous cyst reidentified image 17.  No acute osseous abnormality or lytic or sclerotic osseous lesion.  IMPRESSION: Decrease in size of right upper lobe primary lung malignancy compatible with response to treatment.  Increased bilateral trace pleural effusions.  Other stable findings as above.   Electronically Signed   By: Conchita Paris M.D.   On: 05/21/2014 17:16   ASSESSMENT AND PLAN: This is a very pleasant 63 years old white male recently diagnosed with a stage IIIA non-small  cell lung cancer, completed a course of concurrent chemoradiation with weekly carboplatin and paclitaxel is status post 7 cycle. He tolerated the first cycle of his treatment fairly well. Paclitaxel was discontinued secondary to hypersensitivity reaction at cycle #3. He was started recently on systemic chemotherapy with carboplatin and gemcitabine but unfortunately had significant pancytopenia and he has several delays as well as missed treatment because of the pancytopenia. He had a recent CT scan of the chest to reevaluate his disease were which revealed further decrease in size of the right upper lobe tumor. Patient was discussed with him also seen by Dr. Julien Nordmann. The patient will continue with his systemic chemotherapy however we will dose reduce the carboplatin to an AUC of 4 given on day 1 and gemcitabine 800 mg per meter squared on days 1 and 8 every 3 weeks. He'll followup in one week for cycle #3 of the dose reduced carboplatin and gemcitabine. He'll followup in 4 weeks sure in another symptom management visit prior to start cycle #4  The patient was advised to call immediately if he has any concerning symptoms in the interval. The patient voices understanding of current disease status and treatment options and is in agreement with the current care plan.  All questions were answered. The patient knows to call the clinic with any problems, questions or concerns. We can certainly see the patient much sooner if necessary.  Carlton Adam PA-C  ADDENDUM: Hematology/Oncology Attending: I had a face to face encounter with the patient. I recommended his care plan. This is a very pleasant 63 years old white male with metastatic non-small cell lung cancer, adenocarcinoma initially diagnosed as stage IIIA but the patient had evidence for disease progression and he is currently on systemic chemotherapy with reduced dose carboplatin and gemcitabine. Unfortunately the patient missed day 8 of every cycle  for the last 3 cycles even after the dose reduction. He had restaging scan of the chest performed recently and showed improvement in his disease. I discussed the scan results with the patient and his family. In the presence of benefit from this treatment even with the dose reduction and missed doses and absence of toxicity except for the neutropenia and thrombocytopenia, I recommended for the patient to continue the same treatment regimen. The patient and his family agreed to the current plan. He was started cycle #4 today. I would see the patient back for followup visit in 3 weeks with the start of cycle #5. He was advised to call immediately if he has any concerning symptoms in the interval.  Disclaimer: This note was dictated with voice recognition software. Similar sounding words can inadvertently be transcribed and may not be corrected upon review. Eilleen Kempf., MD 05/27/2014

## 2014-05-26 NOTE — Patient Instructions (Signed)
Return in one week to resume her systemic chemotherapy Continue weekly labs as scheduled Followup in 4 weeks prior to discharge her next scheduled cycle of chemotherapy

## 2014-05-30 ENCOUNTER — Emergency Department (HOSPITAL_COMMUNITY): Payer: Medicare Other

## 2014-05-30 ENCOUNTER — Other Ambulatory Visit: Payer: Self-pay | Admitting: Internal Medicine

## 2014-05-30 ENCOUNTER — Ambulatory Visit (HOSPITAL_BASED_OUTPATIENT_CLINIC_OR_DEPARTMENT_OTHER): Payer: Medicare Other

## 2014-05-30 ENCOUNTER — Other Ambulatory Visit (HOSPITAL_BASED_OUTPATIENT_CLINIC_OR_DEPARTMENT_OTHER): Payer: Medicare Other

## 2014-05-30 ENCOUNTER — Inpatient Hospital Stay (HOSPITAL_COMMUNITY)
Admission: EM | Admit: 2014-05-30 | Discharge: 2014-06-02 | DRG: 193 | Disposition: A | Discharge status: Home Health | Payer: Medicare Other | Attending: Pulmonary Disease | Admitting: Pulmonary Disease

## 2014-05-30 ENCOUNTER — Encounter (HOSPITAL_COMMUNITY): Payer: Self-pay | Admitting: Emergency Medicine

## 2014-05-30 ENCOUNTER — Other Ambulatory Visit: Payer: Self-pay

## 2014-05-30 VITALS — BP 126/66 | HR 78 | Temp 98.4°F | Resp 19

## 2014-05-30 DIAGNOSIS — C3411 Malignant neoplasm of upper lobe, right bronchus or lung: Secondary | ICD-10-CM | POA: Diagnosis present

## 2014-05-30 DIAGNOSIS — J449 Chronic obstructive pulmonary disease, unspecified: Secondary | ICD-10-CM | POA: Diagnosis present

## 2014-05-30 DIAGNOSIS — Z9221 Personal history of antineoplastic chemotherapy: Secondary | ICD-10-CM | POA: Diagnosis not present

## 2014-05-30 DIAGNOSIS — I252 Old myocardial infarction: Secondary | ICD-10-CM

## 2014-05-30 DIAGNOSIS — Z720 Tobacco use: Secondary | ICD-10-CM

## 2014-05-30 DIAGNOSIS — G4733 Obstructive sleep apnea (adult) (pediatric): Secondary | ICD-10-CM | POA: Diagnosis present

## 2014-05-30 DIAGNOSIS — K219 Gastro-esophageal reflux disease without esophagitis: Secondary | ICD-10-CM | POA: Diagnosis present

## 2014-05-30 DIAGNOSIS — Z833 Family history of diabetes mellitus: Secondary | ICD-10-CM | POA: Diagnosis not present

## 2014-05-30 DIAGNOSIS — C3491 Malignant neoplasm of unspecified part of right bronchus or lung: Secondary | ICD-10-CM

## 2014-05-30 DIAGNOSIS — Z801 Family history of malignant neoplasm of trachea, bronchus and lung: Secondary | ICD-10-CM | POA: Diagnosis not present

## 2014-05-30 DIAGNOSIS — E785 Hyperlipidemia, unspecified: Secondary | ICD-10-CM | POA: Diagnosis present

## 2014-05-30 DIAGNOSIS — J189 Pneumonia, unspecified organism: Secondary | ICD-10-CM | POA: Diagnosis present

## 2014-05-30 DIAGNOSIS — Z7982 Long term (current) use of aspirin: Secondary | ICD-10-CM

## 2014-05-30 DIAGNOSIS — I10 Essential (primary) hypertension: Secondary | ICD-10-CM | POA: Diagnosis present

## 2014-05-30 DIAGNOSIS — E7800 Pure hypercholesterolemia, unspecified: Secondary | ICD-10-CM

## 2014-05-30 DIAGNOSIS — J9622 Acute and chronic respiratory failure with hypercapnia: Secondary | ICD-10-CM

## 2014-05-30 DIAGNOSIS — R0602 Shortness of breath: Secondary | ICD-10-CM | POA: Diagnosis present

## 2014-05-30 DIAGNOSIS — J440 Chronic obstructive pulmonary disease with acute lower respiratory infection: Secondary | ICD-10-CM | POA: Diagnosis present

## 2014-05-30 DIAGNOSIS — J9600 Acute respiratory failure, unspecified whether with hypoxia or hypercapnia: Secondary | ICD-10-CM | POA: Diagnosis present

## 2014-05-30 DIAGNOSIS — J90 Pleural effusion, not elsewhere classified: Secondary | ICD-10-CM | POA: Diagnosis present

## 2014-05-30 DIAGNOSIS — Z8042 Family history of malignant neoplasm of prostate: Secondary | ICD-10-CM

## 2014-05-30 DIAGNOSIS — G4730 Sleep apnea, unspecified: Secondary | ICD-10-CM

## 2014-05-30 DIAGNOSIS — I2510 Atherosclerotic heart disease of native coronary artery without angina pectoris: Secondary | ICD-10-CM | POA: Diagnosis present

## 2014-05-30 DIAGNOSIS — Y95 Nosocomial condition: Secondary | ICD-10-CM | POA: Diagnosis present

## 2014-05-30 DIAGNOSIS — F17210 Nicotine dependence, cigarettes, uncomplicated: Secondary | ICD-10-CM | POA: Diagnosis present

## 2014-05-30 DIAGNOSIS — J441 Chronic obstructive pulmonary disease with (acute) exacerbation: Secondary | ICD-10-CM

## 2014-05-30 DIAGNOSIS — E669 Obesity, unspecified: Secondary | ICD-10-CM

## 2014-05-30 DIAGNOSIS — Z5111 Encounter for antineoplastic chemotherapy: Secondary | ICD-10-CM

## 2014-05-30 DIAGNOSIS — R072 Precordial pain: Secondary | ICD-10-CM

## 2014-05-30 LAB — CBC WITH DIFFERENTIAL/PLATELET
BASO%: 1.1 % (ref 0.0–2.0)
BASOS ABS: 0 10*3/uL (ref 0.0–0.1)
BASOS PCT: 0 % (ref 0–1)
Basophils Absolute: 0.1 10*3/uL (ref 0.0–0.1)
EOS ABS: 0.1 10*3/uL (ref 0.0–0.5)
EOS ABS: 0 10*3/uL (ref 0.0–0.7)
EOS PCT: 0 % (ref 0–5)
EOS%: 1.3 % (ref 0.0–7.0)
HCT: 43.3 % (ref 38.4–49.9)
HCT: 46.8 % (ref 39.0–52.0)
HGB: 13.5 g/dL (ref 13.0–17.1)
Hemoglobin: 14.4 g/dL (ref 13.0–17.0)
LYMPH#: 0.4 10*3/uL — AB (ref 0.9–3.3)
LYMPH%: 6.8 % — AB (ref 14.0–49.0)
Lymphocytes Relative: 3 % — ABNORMAL LOW (ref 12–46)
Lymphs Abs: 0.2 10*3/uL — ABNORMAL LOW (ref 0.7–4.0)
MCH: 31.7 pg (ref 27.2–33.4)
MCH: 32.3 pg (ref 26.0–34.0)
MCHC: 31.3 g/dL — ABNORMAL LOW (ref 32.0–36.0)
MCHC: 30.8 g/dL (ref 30.0–36.0)
MCV: 101.4 fL — ABNORMAL HIGH (ref 79.3–98.0)
MCV: 104.9 fL — AB (ref 78.0–100.0)
MONO#: 0.6 10*3/uL (ref 0.1–0.9)
MONO%: 9.9 % (ref 0.0–14.0)
Monocytes Absolute: 0 10*3/uL — ABNORMAL LOW (ref 0.1–1.0)
Monocytes Relative: 1 % — ABNORMAL LOW (ref 3–12)
NEUT%: 80.9 % — ABNORMAL HIGH (ref 39.0–75.0)
NEUTROS ABS: 4.8 10*3/uL (ref 1.5–6.5)
NEUTROS PCT: 96 % — AB (ref 43–77)
Neutro Abs: 7.5 10*3/uL (ref 1.7–7.7)
PLATELETS: 234 10*3/uL (ref 150–400)
Platelets: 189 10*3/uL (ref 140–400)
RBC: 4.27 10*6/uL (ref 4.20–5.82)
RBC: 4.46 MIL/uL (ref 4.22–5.81)
RDW: 16.8 % — AB (ref 11.0–14.6)
RDW: 16.5 % — AB (ref 11.5–15.5)
WBC: 6 10*3/uL (ref 4.0–10.3)
WBC: 7.8 10*3/uL (ref 4.0–10.5)

## 2014-05-30 LAB — COMPREHENSIVE METABOLIC PANEL
ALBUMIN: 3.9 g/dL (ref 3.5–5.2)
ALK PHOS: 79 U/L (ref 39–117)
ALT: 20 U/L (ref 0–53)
ALT: 29 U/L (ref 0–53)
AST: 18 U/L (ref 0–37)
AST: 23 U/L (ref 0–37)
Albumin: 3.6 g/dL (ref 3.5–5.2)
Alkaline Phosphatase: 68 U/L (ref 39–117)
Anion gap: 9 (ref 5–15)
BILIRUBIN TOTAL: 0.2 mg/dL — AB (ref 0.3–1.2)
BUN: 18 mg/dL (ref 6–23)
BUN: 19 mg/dL (ref 6–23)
CALCIUM: 10 mg/dL (ref 8.4–10.5)
CO2: 29 meq/L (ref 19–32)
CO2: 33 mEq/L — ABNORMAL HIGH (ref 19–32)
Calcium: 9.3 mg/dL (ref 8.4–10.5)
Chloride: 102 mEq/L (ref 96–112)
Chloride: 98 mEq/L (ref 96–112)
Creatinine, Ser: 0.93 mg/dL (ref 0.50–1.35)
Creatinine, Ser: 1.03 mg/dL (ref 0.50–1.35)
GFR calc Af Amer: 87 mL/min — ABNORMAL LOW (ref >90)
GFR calc non Af Amer: 75 mL/min — ABNORMAL LOW (ref >90)
GLUCOSE: 104 mg/dL — AB (ref 70–99)
Glucose, Bld: 193 mg/dL — ABNORMAL HIGH (ref 70–99)
POTASSIUM: 4.7 meq/L (ref 3.7–5.3)
Potassium: 5 mEq/L (ref 3.5–5.3)
SODIUM: 140 meq/L (ref 137–147)
Sodium: 141 mEq/L (ref 135–145)
TOTAL PROTEIN: 7.6 g/dL (ref 6.0–8.3)
Total Bilirubin: 0.3 mg/dL (ref 0.3–1.2)
Total Protein: 6.7 g/dL (ref 6.0–8.3)

## 2014-05-30 LAB — BLOOD GAS, ARTERIAL
ACID-BASE EXCESS: 5.5 mmol/L — AB (ref 0.0–2.0)
Bicarbonate: 33.5 mEq/L — ABNORMAL HIGH (ref 20.0–24.0)
DRAWN BY: 23534
O2 Content: 6 L/min
O2 Saturation: 91.7 %
PCO2 ART: 92.8 mmHg — AB (ref 35.0–45.0)
PH ART: 7.183 — AB (ref 7.350–7.450)
PO2 ART: 76.6 mmHg — AB (ref 80.0–100.0)
Patient temperature: 37
TCO2: 31.4 mmol/L (ref 0–100)

## 2014-05-30 LAB — PRO B NATRIURETIC PEPTIDE: PRO B NATRI PEPTIDE: 628.7 pg/mL — AB (ref 0–125)

## 2014-05-30 LAB — TROPONIN I: Troponin I: <0.3 ng/mL (ref <0.30)

## 2014-05-30 MED ORDER — DEXAMETHASONE SODIUM PHOSPHATE 20 MG/5ML IJ SOLN
INTRAMUSCULAR | Status: AC
  Filled 2014-05-30: qty 5

## 2014-05-30 MED ORDER — SODIUM CHLORIDE 0.9 % IJ SOLN
3.0000 mL | Freq: Two times a day (BID) | INTRAMUSCULAR | Status: DC
  Administered 2014-05-31 – 2014-06-02 (×5): 3 mL via INTRAVENOUS

## 2014-05-30 MED ORDER — HEPARIN SODIUM (PORCINE) 5000 UNIT/ML IJ SOLN
5000.0000 [IU] | Freq: Three times a day (TID) | INTRAMUSCULAR | Status: DC
  Administered 2014-05-30 – 2014-06-02 (×9): 5000 [IU] via SUBCUTANEOUS
  Filled 2014-05-30 (×9): qty 1

## 2014-05-30 MED ORDER — POLYETHYLENE GLYCOL 3350 17 G PO PACK: 17.0000 g | PACK | Freq: Every day | ORAL | Status: DC | PRN

## 2014-05-30 MED ORDER — VANCOMYCIN HCL 1000 MG IV SOLR
INTRAVENOUS | Status: AC
  Filled 2014-05-30: qty 2000

## 2014-05-30 MED ORDER — METHYLPREDNISOLONE SODIUM SUCC 125 MG IJ SOLR
125.0000 mg | Freq: Once | INTRAMUSCULAR | Status: AC
  Administered 2014-05-30: 125 mg via INTRAVENOUS
  Filled 2014-05-30: qty 2

## 2014-05-30 MED ORDER — ONDANSETRON 16 MG/50ML IVPB (CHCC)
16.0000 mg | Freq: Once | INTRAVENOUS | Status: AC
  Administered 2014-05-30: 16 mg via INTRAVENOUS

## 2014-05-30 MED ORDER — SIMVASTATIN 20 MG PO TABS
40.0000 mg | ORAL_TABLET | Freq: Every evening | ORAL | Status: DC
  Administered 2014-05-30 – 2014-06-01 (×3): 40 mg via ORAL
  Filled 2014-05-30 (×3): qty 2

## 2014-05-30 MED ORDER — VANCOMYCIN HCL IN DEXTROSE 1-5 GM/200ML-% IV SOLN
INTRAVENOUS | Status: AC
  Filled 2014-05-30: qty 200

## 2014-05-30 MED ORDER — SODIUM CHLORIDE 0.9 % IV SOLN
600.0000 mg | Freq: Once | INTRAVENOUS | Status: AC
  Administered 2014-05-30: 600 mg via INTRAVENOUS
  Filled 2014-05-30: qty 60

## 2014-05-30 MED ORDER — VANCOMYCIN HCL IN DEXTROSE 1-5 GM/200ML-% IV SOLN
1000.0000 mg | Freq: Three times a day (TID) | INTRAVENOUS | Status: DC
  Administered 2014-05-31 – 2014-06-01 (×6): 1000 mg via INTRAVENOUS
  Filled 2014-05-30 (×14): qty 200

## 2014-05-30 MED ORDER — SODIUM CHLORIDE 0.9 % IV SOLN
Freq: Once | INTRAVENOUS | Status: AC
  Administered 2014-05-30: 11:00:00 via INTRAVENOUS

## 2014-05-30 MED ORDER — ASPIRIN EC 81 MG PO TBEC
81.0000 mg | DELAYED_RELEASE_TABLET | Freq: Every day | ORAL | Status: DC
  Administered 2014-05-30 – 2014-06-01 (×3): 81 mg via ORAL
  Filled 2014-05-30 (×2): qty 1

## 2014-05-30 MED ORDER — FAMOTIDINE 20 MG PO TABS
20.0000 mg | ORAL_TABLET | Freq: Every day | ORAL | Status: DC
  Administered 2014-05-30 – 2014-06-02 (×4): 20 mg via ORAL
  Filled 2014-05-30 (×4): qty 1

## 2014-05-30 MED ORDER — IOHEXOL 350 MG/ML SOLN
100.0000 mL | Freq: Once | INTRAVENOUS | Status: AC | PRN
  Administered 2014-05-30: 100 mL via INTRAVENOUS

## 2014-05-30 MED ORDER — TIOTROPIUM BROMIDE MONOHYDRATE 18 MCG IN CAPS
18.0000 ug | ORAL_CAPSULE | Freq: Every day | RESPIRATORY_TRACT | Status: DC
  Filled 2014-05-30: qty 5

## 2014-05-30 MED ORDER — IPRATROPIUM-ALBUTEROL 0.5-2.5 (3) MG/3ML IN SOLN
3.0000 mL | Freq: Once | RESPIRATORY_TRACT | Status: AC
  Administered 2014-05-30: 3 mL via RESPIRATORY_TRACT
  Filled 2014-05-30: qty 3

## 2014-05-30 MED ORDER — ALBUTEROL (5 MG/ML) CONTINUOUS INHALATION SOLN
10.0000 mg/h | INHALATION_SOLUTION | Freq: Once | RESPIRATORY_TRACT | Status: AC
  Administered 2014-05-30: 10 mg/h via RESPIRATORY_TRACT
  Filled 2014-05-30: qty 20

## 2014-05-30 MED ORDER — VANCOMYCIN HCL 10 G IV SOLR
2000.0000 mg | Freq: Once | INTRAVENOUS | Status: AC
  Administered 2014-05-30: 2000 mg via INTRAVENOUS
  Filled 2014-05-30: qty 2000

## 2014-05-30 MED ORDER — GI COCKTAIL ~~LOC~~
30.0000 mL | Freq: Four times a day (QID) | ORAL | Status: DC | PRN
  Filled 2014-05-30: qty 30

## 2014-05-30 MED ORDER — DEXAMETHASONE SODIUM PHOSPHATE 20 MG/5ML IJ SOLN
20.0000 mg | Freq: Once | INTRAMUSCULAR | Status: AC
  Administered 2014-05-30: 20 mg via INTRAVENOUS

## 2014-05-30 MED ORDER — HEPARIN SODIUM (PORCINE) 5000 UNIT/ML IJ SOLN: 5000.0000 [IU] | Freq: Three times a day (TID) | INTRAMUSCULAR | Status: DC

## 2014-05-30 MED ORDER — PROCHLORPERAZINE MALEATE 5 MG PO TABS: 10.0000 mg | ORAL_TABLET | Freq: Four times a day (QID) | ORAL | Status: DC | PRN

## 2014-05-30 MED ORDER — DEXTROSE 5 % IV SOLN
1.0000 g | Freq: Three times a day (TID) | INTRAVENOUS | Status: DC
  Administered 2014-05-30 – 2014-06-02 (×8): 1 g via INTRAVENOUS
  Filled 2014-05-30 (×15): qty 1

## 2014-05-30 MED ORDER — SODIUM CHLORIDE 0.9 % IV SOLN
800.0000 mg/m2 | Freq: Once | INTRAVENOUS | Status: AC
  Administered 2014-05-30: 1976 mg via INTRAVENOUS
  Filled 2014-05-30: qty 51.97

## 2014-05-30 MED ORDER — ONDANSETRON 16 MG/50ML IVPB (CHCC)
INTRAVENOUS | Status: AC
Start: 2014-05-30 — End: 2014-05-30
  Filled 2014-05-30: qty 16

## 2014-05-30 MED ORDER — DEXTROSE 5 % IV SOLN
INTRAVENOUS | Status: AC
  Filled 2014-05-30: qty 1

## 2014-05-30 MED ORDER — CEFEPIME HCL 1 G IJ SOLR
INTRAMUSCULAR | Status: AC
  Filled 2014-05-30: qty 1

## 2014-05-30 MED ORDER — SENNA 8.6 MG PO TABS
1.0000 | ORAL_TABLET | Freq: Two times a day (BID) | ORAL | Status: DC
  Administered 2014-05-30 – 2014-06-02 (×6): 8.6 mg via ORAL
  Filled 2014-05-30 (×7): qty 1

## 2014-05-30 MED ORDER — ACETAMINOPHEN 650 MG RE SUPP: 650.0000 mg | Freq: Four times a day (QID) | RECTAL | Status: DC | PRN

## 2014-05-30 MED ORDER — MORPHINE SULFATE 2 MG/ML IJ SOLN: 2.0000 mg | INTRAMUSCULAR | Status: DC | PRN

## 2014-05-30 MED ORDER — ACETAMINOPHEN 325 MG PO TABS: 650.0000 mg | ORAL_TABLET | Freq: Four times a day (QID) | ORAL | Status: DC | PRN

## 2014-05-30 MED ORDER — METOPROLOL TARTRATE 50 MG PO TABS
100.0000 mg | ORAL_TABLET | Freq: Every day | ORAL | Status: DC
  Administered 2014-05-30 – 2014-06-01 (×3): 100 mg via ORAL
  Filled 2014-05-30 (×3): qty 2

## 2014-05-30 NOTE — H&P (Addendum)
[facility] History and Physical  Carlos Goodwin EGB:[id number] DOB: 04-11-1951 DOA: 05/30/2014  Referring physician: Dr. Betsey Holiday PCP: Alonza Bogus, MD   Chief Complaint: SOB  HPI: Carlos Goodwin is a 63 y.o. male  Presenting to [facility] ED due to SOB and chest pain. Shortness of breath has been progressing slowly over the past week. Increased cold or allergy type symptoms recently . No home O2. Continues to smoke 1/2ppd. Denies fevers, n/v. Albuterol w/ benefit.  CP started this afternoon about 2 hours after a chemo infusion (lung cancer). L sided. Radiation down L arm. Feels like something is sitting on his chest. Improved w/ oxygen in ED.    Review of Systems:  Per HPI w/ all other systems negative Constitutional: Fevers, chills, fatigue.  HEENT: No headaches, Sore throat,  Cardio-vascular: Orthopnea, swelling in lower extremities,  palpitations  GI: No heartburn, abdominal pain, nausea, vomiting, diarrhea, change in bowel habits, loss of appetite  Resp: SOB as above Skin: no rash or lesions.  GU: no dysuria, change in color of urine, no urgency or frequency. No flank pain.  Musculoskeletal: No joint pain or swelling. No decreased range of motion.  Psych: No change in mood or affect. No depression or anxiety.   Past Medical History  Diagnosis Date  . COPD (chronic obstructive pulmonary disease)   . Coronary artery disease   . Sleep apnea   . Hypertension   . High cholesterol   . MI (myocardial infarction) 2004  . GERD (gastroesophageal reflux disease)   . Family history of anesthesia complication     Daughter has nausea and vomiting  . Shortness of breath   . lung ca dx'd 10/2013    lungs x 3 spots   Past Surgical History  Procedure Laterality Date  . Appendectomy    . Coronary angioplasty  2004    1 stent  . Anterior cruciate ligament repair Right   . Tonsillectomy    . Prostate biopsy N/A 06/13/2013    Procedure: ULTRASOUND GUIDED PROSTATE BIOPSY;  Surgeon:  Marissa Nestle, MD;  Location: [facility];  Service: Urology;  Laterality: N/A;  . Video bronchoscopy with endobronchial navigation N/A 12/12/2013    Procedure: VIDEO BRONCHOSCOPY WITH ENDOBRONCHIAL NAVIGATION;  Surgeon: Melrose Nakayama, MD;  Location: [facility];  Service: Thoracic;  Laterality: N/A;  . Video bronchoscopy with endobronchial ultrasound N/A 12/12/2013    Procedure: VIDEO BRONCHOSCOPY WITH ENDOBRONCHIAL ULTRASOUND;  Surgeon: Melrose Nakayama, MD;  Location: [facility];  Service: Thoracic;  Laterality: N/A;   Social History:  reports that he has been smoking Cigarettes.  He has a 50 pack-year smoking history. He has never used smokeless tobacco. He reports that he does not drink alcohol or use illicit drugs.  Allergies  Allergen Reactions  . Taxol [Paclitaxel] Shortness Of Breath    Decreased O2 sats.  . Plavix [Clopidogrel Bisulfate]     Hives and rash  . Other Rash    States that in 2004 he was placed on a blood thinner that made him break out in a rash.?plavix     Family History  Problem Relation Age of Onset  . Diabetes Mother   . Cancer Father     asssumed lung cancer  . Cancer Maternal Grandfather     prostate cancer     Prior to Admission medications   Medication Sig Start Date End Date Taking? Authorizing Provider  aspirin EC 81 MG tablet Take 81 mg by mouth at bedtime.  Yes Historical Provider, MD  metoprolol (LOPRESSOR) 100 MG tablet Take 100 mg by mouth at bedtime.   Yes Historical Provider, MD  ranitidine (ZANTAC) 150 MG tablet Take 150 mg by mouth daily.   Yes Historical Provider, MD  simvastatin (ZOCOR) 40 MG tablet Take 40 mg by mouth every evening.   Yes Historical Provider, MD  tiotropium (SPIRIVA) 18 MCG inhalation capsule Place 18 mcg into inhaler and inhale daily.   Yes Historical Provider, MD  albuterol (PROVENTIL HFA;VENTOLIN HFA) 108 (90 BASE) MCG/ACT inhaler Inhale 1-2 puffs into the lungs every 6 (six) hours as needed for wheezing or shortness of  breath.    Historical Provider, MD  prochlorperazine (COMPAZINE) 10 MG tablet Take 1 tablet (10 mg total) by mouth every 6 (six) hours as needed for nausea or vomiting. 12/14/13   Curt Bears, MD  Wound Cleansers (RADIAPLEX EX) Apply topically.    Historical Provider, MD   Physical Exam: Filed Vitals:   05/30/14 1830 05/30/14 1900 05/30/14 1930 05/30/14 1959  BP: 126/75 119/71 113/64   Pulse: 104 106 98   Temp:      TempSrc:      Resp: 19 23 19    Height:      Weight:      SpO2: 86% 85% 87% 89%    Wt Readings from Last 3 Encounters:  05/30/14 124.286 kg (274 lb)  05/23/14 126.145 kg (278 lb 1.6 oz)  05/16/14 129.457 kg (285 lb 6.4 oz)    General: mild distress Eyes: PERRL, normal lids, irises & conjunctiva ENT: grossly normal hearing, mmm Neck: nml ROM, masses or thyromegaly Cardiovascular: RRR, II/VI systolic murmur. No LE edema. Telemetry: SR, no arrhythmias  Respiratory: Bilat wheezing w/ scattered ronchi and crackles on L. Marked increased WOB. On BIPAP Abdomen: soft, ntnd Skin: no rash or induration seen on limited exam Musculoskeletal:  grossly normal tone BUE/BLE Psychiatric:  grossly normal mood and affect, speech fluent and appropriate Neurologic:  grossly non-focal.          Labs on Admission:  Basic Metabolic Panel:  Recent Labs Lab 05/30/14 1011 05/30/14 1656  NA 141 140  K 5.0 4.7  CL 102 98  CO2 29 33*  GLUCOSE 104* 193*  BUN 18 19  CREATININE 0.93 1.03  CALCIUM 9.3 10.0   Liver Function Tests:  Recent Labs Lab 05/30/14 1011 05/30/14 1656  AST 18 23  ALT 20 29  ALKPHOS 68 79  BILITOT 0.2* 0.3  PROT 6.7 7.6  ALBUMIN 3.6 3.9   No results found for this basename: LIPASE, AMYLASE,  in the last 168 hours No results found for this basename: AMMONIA,  in the last 168 hours CBC:  Recent Labs Lab 05/30/14 1007 05/30/14 1656  WBC 6.0 7.8  NEUTROABS 4.8 7.5  HGB 13.5 14.4  HCT 43.3 46.8  MCV 101.4* 104.9*  PLT 189 234   Cardiac  Enzymes:  Recent Labs Lab 05/30/14 1656  TROPONINI <0.30    BNP (last 3 results)  Recent Labs  05/30/14 1656  PROBNP 628.7*   CBG: No results found for this basename: GLUCAP,  in the last 168 hours  Radiological Exams on Admission: Dg Chest 2 View  05/30/2014   CLINICAL DATA:  Chest pain radiating down the left arm. Shortness of breath. Lung cancer. coronary artery disease.  EXAM: CHEST  2 VIEW  COMPARISON:  05/21/2014  FINDINGS: Small right pleural effusion. Calcified pleural plaques bilaterally. The right upper lobe primary lung malignancy is relatively  indistinct on today's exam. Fullness of the right hilum. No pneumothorax.  IMPRESSION: 1. Accentuated right hilar prominence, potentially vascular but possibly from adenopathy. If the patient has onset of symptoms raises concern for pulmonary embolus then chest CT angiogram would be suggested. 2. Small right pleural effusion. Chronic left pleural thickening with bilateral calcified pleural plaques. 3. No overt pulmonary edema.   Electronically Signed   By: Sherryl Barters M.D.   On: 05/30/2014 18:11   Ct Angio Chest Pe W/cm &/or Wo Cm  05/30/2014   CLINICAL DATA:  Chest pain and shortness of breath  EXAM: CT ANGIOGRAPHY CHEST WITH CONTRAST  TECHNIQUE: Multidetector CT imaging of the chest was performed using the standard protocol during bolus administration of intravenous contrast. Multiplanar CT image reconstructions and MIPs were obtained to evaluate the vascular anatomy.  CONTRAST:  154mL OMNIPAQUE IOHEXOL 350 MG/ML SOLN  COMPARISON:  05/21/2014  FINDINGS: There again noted chronic changes in the left hemi thorax with calcified pleural plaques and mild scarring. Increased density is identified however in the posterior costophrenic angle on the left best seen on image number 83 of series 6. This measures approximately 2.9 x 2.2 cm. It has increased in size from the prior exam at which time it measured 2.0 x 1.7 cm. Calcified pleural  plaques are again noted on the right. The known right upper lobe mass lesion is stable in appearance when compare with the prior exam. Increasing right-sided pleural effusion is noted however.  The aorta and pulmonary artery are well visualized. No evidence of pulmonary emboli are seen. Persistent right hilar lymph node is identified. It now measures approximately 16 mm in short axis. No other significant lymphadenopathy is noted. Visualized upper abdomen is within normal limits. The vague hypodense lesion in the liver was not covered on this examination. The osseous structures show no acute abnormality.  Review of the MIP images confirms the above findings.  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  Stable appearing right upper lobe mass lesion. There is however in enlarging right-sided pleural effusion when compared with the prior study.  Although not mentioned in the body of the report, there is increasing infiltrate in the right middle lobe with volume loss in the right middle lobe. This would account in part for the fullness of the right hilum on the plain film examination.  No evidence of pulmonary emboli.  Persistent and mildly enlarged right hilar lymph node.  Increase in the degree of infiltrated density in the posterior aspect of the left lower lobe. This is increased significantly in the past 9 days and likely represents pneumonic infiltrate.   Electronically Signed   By: Inez Catalina M.D.   On: 05/30/2014 19:11    EKG: Independently reviewed. NSR  Assessment/Plan Active Problems:   HCAP (healthcare-associated pneumonia)  63yo M q/ severe COPD, CAD, OSA, HTN, GERD, and lung cancer presenting in acute respiratory failure and HCAP, and acute onset chest pain s/p chemo infusion today  Acute Respiratory failure: secondary to COPD and HCAP. ABG on admission pH 7.183, CO2 92.8, O2 76.6, Bicarb 33.5. No home O2. COPD exacerbation  compounded by HCAP. Solumedrol 125mg  given in ED. CT showing Stable R lung mass w/ increasing pleural effusions adn LLL pneumonia.  - Admit to step down - Continue BIPAP - Repeat ABG in 4 hours  - Solumedrol 60mg  Q6 - Duoneb Q4/Q2PRN - IV lasix 20x1 - BCX and sputum CX - Start Cefepime adn Vanc -  CXR in the am - CMET and CBC in am - contact Dr. Luan Pulling in the am to assume care  Chest pain: h/o MI in 2004. Cath in 2014 showing stable single CAD w/ patent mid LAD stent, and EF 55-60%. Current episode concerning for ACS vs GERD vs Respiratory failure/HCAP. Troponin neg. Pro BNP 628. - tele - cycle troponin - Respiratory treatment as above.  - nitro, morphine PRN - GI cocktail  Lung Cancer: Stable disease in active treatment. Chemo infusion on 05/30/14 prior to admission.  WBC 7.8 w/ L shift.  - monitor CBC for signs of immune compromise  Tobacco abuse: continues to smoke 1/2ppd - Nicotine patch  HTN: At goal - continue metoprolol  HLD:  - continue simvastatin  GERD: - Continue zantac   Code Status: Limited - OK for intubation but no CPR.  DVT Prophylaxis: Hep [place] TID Family Communication: Wife adn daughter present for admission Disposition Plan: pending improvement  Time spent: >70 min in direct pt care adn coordination  [place] [email or web address] Password [email or web address]

## 2014-05-30 NOTE — ED Notes (Signed)
Patient states that he was receiving a new chemo medication today and since about 2pm today started having chest pain

## 2014-05-30 NOTE — Patient Instructions (Signed)
[facility] Discharge Instructions for Patients Receiving Chemotherapy  Today you received the following chemotherapy agents: Gemzar, Carboplatin  To help prevent nausea and vomiting after your treatment, we encourage you to take your nausea medication as prescribed by your physician.  If you develop nausea and vomiting that is not controlled by your nausea medication, call the clinic.   BELOW ARE SYMPTOMS THAT SHOULD BE REPORTED IMMEDIATELY:  *FEVER GREATER THAN 100.5 F  *CHILLS WITH OR WITHOUT FEVER  NAUSEA AND VOMITING THAT IS NOT CONTROLLED WITH YOUR NAUSEA MEDICATION  *UNUSUAL SHORTNESS OF BREATH  *UNUSUAL BRUISING OR BLEEDING  TENDERNESS IN MOUTH AND THROAT WITH OR WITHOUT PRESENCE OF ULCERS  *URINARY PROBLEMS  *BOWEL PROBLEMS  UNUSUAL RASH Items with * indicate a potential emergency and should be followed up as soon as possible.  Feel free to call the clinic you have any questions or concerns. The clinic phone number is [phone].

## 2014-05-30 NOTE — ED Notes (Signed)
Respiratory paged

## 2014-05-30 NOTE — ED Notes (Signed)
CRITICAL VALUE ALERT  Critical value received:  ABG  Date of notification:  05/11/14  Time of notification:  2022  Critical value read back:Yes.    Nurse who received alert:  Holli Humbles  MD notified (1st page): Dr. Betsey Holiday  Time of first page:  2022  MD notified (2nd page):  Time of second page:  Responding MD:  Dr. Betsey Holiday  Time MD responded: 2022

## 2014-05-30 NOTE — Progress Notes (Signed)
ANTIBIOTIC CONSULT NOTE - INITIAL  Pharmacy Consult for Vancomycin Indication: pneumonia  Allergies  Allergen Reactions  . Taxol [Paclitaxel] Shortness Of Breath    Decreased O2 sats.  . Plavix [Clopidogrel Bisulfate]     Hives and rash  . Other Rash    States that in 2004 he was placed on a blood thinner that made him break out in a rash.?plavix     Patient Measurements: Height: 5\' 10"  (177.8 cm) Weight: 284 lb 6.3 oz (129 kg) IBW/kg (Calculated) : 73  Vital Signs: Temp: 97.6 F (36.4 C) (10/21 2127) Temp Source: Axillary (10/21 2127) BP: 119/59 mmHg (10/21 2127) Pulse Rate: 93 (10/21 2127) Intake/Output from previous day:   Intake/Output from this shift:    Labs:  Recent Labs  05/30/14 1007 05/30/14 1011 05/30/14 1656  WBC 6.0  --  7.8  HGB 13.5  --  14.4  PLT 189  --  234  CREATININE  --  0.93 1.03   Estimated Creatinine Clearance: 97.8 ml/min (by C-G formula based on Cr of 1.03). No results found for this basename: VANCOTROUGH, VANCOPEAK, VANCORANDOM, GENTTROUGH, GENTPEAK, GENTRANDOM, TOBRATROUGH, TOBRAPEAK, TOBRARND, AMIKACINPEAK, AMIKACINTROU, AMIKACIN,  in the last 72 hours   Microbiology: No results found for this or any previous visit (from the past 720 hour(s)).  Medical History: Past Medical History  Diagnosis Date  . COPD (chronic obstructive pulmonary disease)   . Coronary artery disease   . Sleep apnea   . Hypertension   . High cholesterol   . MI (myocardial infarction) 2004  . GERD (gastroesophageal reflux disease)   . Family history of anesthesia complication     Daughter has nausea and vomiting  . Shortness of breath   . lung ca dx'd 10/2013    lungs x 3 spots   Anti-infectives   Start     Dose/Rate Route Frequency Ordered Stop   05/31/14 0600  vancomycin (VANCOCIN) IVPB 1000 mg/200 mL premix     1,000 mg 200 mL/hr over 60 Minutes Intravenous Every 8 hours 05/30/14 2141     05/30/14 2200  ceFEPIme (MAXIPIME) 1 g in dextrose 5 % 50  mL IVPB     1 g 100 mL/hr over 30 Minutes Intravenous 3 times per day 05/30/14 2127 06/07/14 2159   05/30/14 2200  vancomycin (VANCOCIN) 2,000 mg in sodium chloride 0.9 % 500 mL IVPB     2,000 mg 250 mL/hr over 120 Minutes Intravenous  Once 05/30/14 2141        Assessment: 62yo morbidly obese male admitted with worsening SOB.  Pt has good renal fxn.  Asked to initiate Vancomycin for pna.  Goal of Therapy:  Vancomycin trough level 15-20 mcg/ml  Plan:  Vancomycin 2000mg  IV now x 1 dose then Vancomycin 1000mg  IV q8h Check trough at steady state Monitor labs, renal fxn, and cultures  Hart Robinsons A 05/30/2014,9:42 PM

## 2014-05-30 NOTE — ED Provider Notes (Signed)
CSN: [id number]     Arrival date & time 05/30/14  1646 History   First MD Initiated Contact with Patient 05/30/14 1648     Chief Complaint  Patient presents with  . Chest Pain     (Consider location/radiation/quality/duration/timing/severity/associated sxs/prior Treatment) HPI Comments: Patient presents to the ER for evaluation of chest pain and shortness of breath. Patient reports onset of left-sided chest pain radiating down his left arm this afternoon. Symptoms began after receiving a new chemotherapy infusion for his lung cancer. Patient is expressing increased shortness of breath. He reports discomfort, heaviness in the chest that has been waxing and waning since it started 3 hours ago. It has never completely gone away. Patient has experienced increased cough. There has been no fever.  Patient is a 63 y.o. male presenting with chest pain.  Chest Pain Associated symptoms: cough and shortness of breath     Past Medical History  Diagnosis Date  . COPD (chronic obstructive pulmonary disease)   . Coronary artery disease   . Sleep apnea   . Hypertension   . High cholesterol   . MI (myocardial infarction) 2004  . GERD (gastroesophageal reflux disease)   . Family history of anesthesia complication     Daughter has nausea and vomiting  . Shortness of breath   . lung ca dx'd 10/2013    lungs x 3 spots   Past Surgical History  Procedure Laterality Date  . Appendectomy    . Coronary angioplasty  2004    1 stent  . Anterior cruciate ligament repair Right   . Tonsillectomy    . Prostate biopsy N/A 06/13/2013    Procedure: ULTRASOUND GUIDED PROSTATE BIOPSY;  Surgeon: Marissa Nestle, MD;  Location: [facility];  Service: Urology;  Laterality: N/A;  . Video bronchoscopy with endobronchial navigation N/A 12/12/2013    Procedure: VIDEO BRONCHOSCOPY WITH ENDOBRONCHIAL NAVIGATION;  Surgeon: Melrose Nakayama, MD;  Location: [facility];  Service: Thoracic;  Laterality: N/A;  . Video bronchoscopy  with endobronchial ultrasound N/A 12/12/2013    Procedure: VIDEO BRONCHOSCOPY WITH ENDOBRONCHIAL ULTRASOUND;  Surgeon: Melrose Nakayama, MD;  Location: [facility];  Service: Thoracic;  Laterality: N/A;   Family History  Problem Relation Age of Onset  . Diabetes Mother   . Cancer Father     asssumed lung cancer  . Cancer Maternal Grandfather     prostate cancer   History  Substance Use Topics  . Smoking status: Current Every Day Smoker -- 1.00 packs/day for 50 years    Types: Cigarettes  . Smokeless tobacco: Never Used     Comment: Pt wants to quit smoking on his own; wants no materials  . Alcohol Use: No     Comment: social alcohol    Review of Systems  Respiratory: Positive for cough and shortness of breath.   Cardiovascular: Positive for chest pain.  All other systems reviewed and are negative.     Allergies  Taxol; Plavix; and Other  Home Medications   Prior to Admission medications   Medication Sig Start Date End Date Taking? Authorizing Provider  aspirin EC 81 MG tablet Take 81 mg by mouth at bedtime.   Yes Historical Provider, MD  metoprolol (LOPRESSOR) 100 MG tablet Take 100 mg by mouth at bedtime.   Yes Historical Provider, MD  ranitidine (ZANTAC) 150 MG tablet Take 150 mg by mouth daily.   Yes Historical Provider, MD  simvastatin (ZOCOR) 40 MG tablet Take 40 mg by mouth every evening.  Yes Historical Provider, MD  tiotropium (SPIRIVA) 18 MCG inhalation capsule Place 18 mcg into inhaler and inhale daily.   Yes Historical Provider, MD  albuterol (PROVENTIL HFA;VENTOLIN HFA) 108 (90 BASE) MCG/ACT inhaler Inhale 1-2 puffs into the lungs every 6 (six) hours as needed for wheezing or shortness of breath.    Historical Provider, MD  prochlorperazine (COMPAZINE) 10 MG tablet Take 1 tablet (10 mg total) by mouth every 6 (six) hours as needed for nausea or vomiting. 12/14/13   Curt Bears, MD  Wound Cleansers (RADIAPLEX EX) Apply topically.    Historical Provider, MD   BP  103/56  Pulse 85  Temp(Src) 98.3 F (36.8 C) (Oral)  Resp 16  Ht 5\' 10"  (1.778 m)  Wt 284 lb 6.3 oz (129 kg)  BMI 40.81 kg/m2  SpO2 98% Physical Exam  Constitutional: He is oriented to person, place, and time. He appears well-developed and well-nourished. No distress.  HENT:  Head: Normocephalic and atraumatic.  Right Ear: Hearing normal.  Left Ear: Hearing normal.  Nose: Nose normal.  Mouth/Throat: Oropharynx is clear and moist and mucous membranes are normal.  Eyes: Conjunctivae and EOM are normal. Pupils are equal, round, and reactive to light.  Neck: Normal range of motion. Neck supple.  Cardiovascular: Regular rhythm, S1 normal and S2 normal.  Exam reveals no gallop and no friction rub.   No murmur heard. Pulmonary/Chest: Effort normal. No respiratory distress. He has decreased breath sounds. He has wheezes. He exhibits no tenderness.  Abdominal: Soft. Normal appearance and bowel sounds are normal. There is no hepatosplenomegaly. There is no tenderness. There is no rebound, no guarding, no tenderness at McBurney's point and negative Murphy's sign. No hernia.  Musculoskeletal: Normal range of motion.  Neurological: He is alert and oriented to person, place, and time. He has normal strength. No cranial nerve deficit or sensory deficit. Coordination normal. GCS eye subscore is 4. GCS verbal subscore is 5. GCS motor subscore is 6.  Skin: Skin is warm, dry and intact. No rash noted. No cyanosis.  Psychiatric: He has a normal mood and affect. His speech is normal and behavior is normal. Thought content normal.    ED Course  Procedures (including critical care time) Labs Review Labs Reviewed  CBC WITH DIFFERENTIAL - Abnormal; Notable for the following:    MCV 104.9 (*)    RDW 16.5 (*)    Neutrophils Relative % 96 (*)    Lymphocytes Relative 3 (*)    Lymphs Abs 0.2 (*)    Monocytes Relative 1 (*)    Monocytes Absolute 0.0 (*)    All other components within normal limits   COMPREHENSIVE METABOLIC PANEL - Abnormal; Notable for the following:    CO2 33 (*)    Glucose, Bld 193 (*)    GFR calc non Af Amer 75 (*)    GFR calc Af Amer 87 (*)    All other components within normal limits  PRO B NATRIURETIC PEPTIDE - Abnormal; Notable for the following:    Pro B Natriuretic peptide (BNP) 628.7 (*)    All other components within normal limits  BLOOD GAS, ARTERIAL - Abnormal; Notable for the following:    pH, Arterial 7.183 (*)    pCO2 arterial 92.8 (*)    pO2, Arterial 76.6 (*)    Bicarbonate 33.5 (*)    Acid-Base Excess 5.5 (*)    All other components within normal limits  BLOOD GAS, ARTERIAL - Abnormal; Notable for the following:  pH, Arterial 7.204 (*)    pCO2 arterial 85.8 (*)    pO2, Arterial 68.0 (*)    Bicarbonate 32.6 (*)    Acid-Base Excess 5.1 (*)    All other components within normal limits  COMPREHENSIVE METABOLIC PANEL - Abnormal; Notable for the following:    Potassium 5.9 (*)    CO2 33 (*)    Glucose, Bld 152 (*)    Albumin 3.4 (*)    Total Bilirubin 0.2 (*)    GFR calc non Af Amer 78 (*)    GFR calc Af Amer 90 (*)    All other components within normal limits  CBC - Abnormal; Notable for the following:    RBC 3.98 (*)    Hemoglobin 12.9 (*)    MCV 105.5 (*)    RDW 16.2 (*)    All other components within normal limits  BLOOD GAS, ARTERIAL - Abnormal; Notable for the following:    pH, Arterial 7.196 (*)    pCO2 arterial 87.9 (*)    pO2, Arterial 78.7 (*)    Bicarbonate 32.7 (*)    Acid-Base Excess 5.1 (*)    All other components within normal limits  CULTURE, BLOOD (ROUTINE X 2)  CULTURE, BLOOD (ROUTINE X 2)  MRSA PCR SCREENING  CULTURE, EXPECTORATED SPUTUM-ASSESSMENT  GRAM STAIN  TROPONIN I  HIV ANTIBODY (ROUTINE TESTING)  TROPONIN I  TROPONIN I  LEGIONELLA ANTIGEN, URINE  STREP PNEUMONIAE URINARY ANTIGEN  CBC WITH DIFFERENTIAL  BASIC METABOLIC PANEL    Imaging Review Dg Chest 2 View  05/30/2014   CLINICAL DATA:   Chest pain radiating down the left arm. Shortness of breath. Lung cancer. coronary artery disease.  EXAM: CHEST  2 VIEW  COMPARISON:  05/21/2014  FINDINGS: Small right pleural effusion. Calcified pleural plaques bilaterally. The right upper lobe primary lung malignancy is relatively indistinct on today's exam. Fullness of the right hilum. No pneumothorax.  IMPRESSION: 1. Accentuated right hilar prominence, potentially vascular but possibly from adenopathy. If the patient has onset of symptoms raises concern for pulmonary embolus then chest CT angiogram would be suggested. 2. Small right pleural effusion. Chronic left pleural thickening with bilateral calcified pleural plaques. 3. No overt pulmonary edema.   Electronically Signed   By: Sherryl Barters M.D.   On: 05/30/2014 18:11   Ct Angio Chest Pe W/cm &/or Wo Cm  05/30/2014   CLINICAL DATA:  Chest pain and shortness of breath  EXAM: CT ANGIOGRAPHY CHEST WITH CONTRAST  TECHNIQUE: Multidetector CT imaging of the chest was performed using the standard protocol during bolus administration of intravenous contrast. Multiplanar CT image reconstructions and MIPs were obtained to evaluate the vascular anatomy.  CONTRAST:  150mL OMNIPAQUE IOHEXOL 350 MG/ML SOLN  COMPARISON:  05/21/2014  FINDINGS: There again noted chronic changes in the left hemi thorax with calcified pleural plaques and mild scarring. Increased density is identified however in the posterior costophrenic angle on the left best seen on image number 83 of series 6. This measures approximately 2.9 x 2.2 cm. It has increased in size from the prior exam at which time it measured 2.0 x 1.7 cm. Calcified pleural plaques are again noted on the right. The known right upper lobe mass lesion is stable in appearance when compare with the prior exam. Increasing right-sided pleural effusion is noted however.  The aorta and pulmonary artery are well visualized. No evidence of pulmonary emboli are seen. Persistent  right hilar lymph node is identified. It now measures approximately  16 mm in short axis. No other significant lymphadenopathy is noted. Visualized upper abdomen is within normal limits. The vague hypodense lesion in the liver was not covered on this examination. The osseous structures show no acute abnormality.  Review of the MIP images confirms the above findings.  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  Stable appearing right upper lobe mass lesion. There is however in enlarging right-sided pleural effusion when compared with the prior study.  Although not mentioned in the body of the report, there is increasing infiltrate in the right middle lobe with volume loss in the right middle lobe. This would account in part for the fullness of the right hilum on the plain film examination.  No evidence of pulmonary emboli.  Persistent and mildly enlarged right hilar lymph node.  Increase in the degree of infiltrated density in the posterior aspect of the left lower lobe. This is increased significantly in the past 9 days and likely represents pneumonic infiltrate.   Electronically Signed   By: Inez Catalina M.D.   On: 05/30/2014 19:11   Portable Chest 1 View  05/31/2014   CLINICAL DATA:  63 year old with healthcare associated pneumonia. History of COPD and lung cancer. Patient is undergoing chemotherapy.  EXAM: PORTABLE CHEST - 1 VIEW  COMPARISON:  05/30/2014  FINDINGS: Increasing densities in the right lower chest and right mid lung. Streaky densities at the left lung base. Heart size is grossly stable. The trachea is midline.  IMPRESSION: Increased densities in the right mid and lower chest. Findings suggest a combination of pleural fluid and atelectasis. Infection cannot be excluded.  Densities at the left lung base are most compatible with atelectasis.   Electronically Signed   By: Markus Daft M.D.   On: 05/31/2014 08:13     EKG  Interpretation None      MDM   Final diagnoses:  Shortness of breath   Presented to the ER for evaluation of chest pain. Patient started having chest pain after receiving chemotherapy earlier today. He was experiencing increased shortness of breath as well. Patient is currently receiving treatment for right-sided lung cancer. It has been documented that he has improved after treatment. Chest x-ray did not show any obvious abnormality. CT scan was performed to rule out PE. No PE was seen, but he does have findings consistent with enlarging right pleural effusion and likely left-sided pneumonia. Patient initiated on treatment for healthcare associated pneumonia and admitted to the hospital.    Orpah Greek, MD 05/31/14 2333

## 2014-05-31 ENCOUNTER — Inpatient Hospital Stay (HOSPITAL_COMMUNITY): Payer: Medicare Other

## 2014-05-31 LAB — TROPONIN I: Troponin I: <0.3 ng/mL (ref <0.30)

## 2014-05-31 LAB — HIV ANTIBODY (ROUTINE TESTING W REFLEX): HIV: NONREACTIVE

## 2014-05-31 LAB — BLOOD GAS, ARTERIAL
ACID-BASE EXCESS: 5.1 mmol/L — AB (ref 0.0–2.0)
ACID-BASE EXCESS: 5.1 mmol/L — AB (ref 0.0–2.0)
BICARBONATE: 32.6 meq/L — AB (ref 20.0–24.0)
Bicarbonate: 32.7 mEq/L — ABNORMAL HIGH (ref 20.0–24.0)
DELIVERY SYSTEMS: POSITIVE
DRAWN BY: 22223
Delivery systems: POSITIVE
Drawn by: 22223
Expiratory PAP: 5
Expiratory PAP: 5
FIO2: 55 %
FIO2: 60 %
INSPIRATORY PAP: 17
Inspiratory PAP: 20
O2 SAT: 92.5 %
O2 SAT: 89.5 %
PCO2 ART: 85.8 mmHg — AB (ref 35.0–45.0)
PH ART: 7.196 — AB (ref 7.350–7.450)
PO2 ART: 68 mmHg — AB (ref 80.0–100.0)
Patient temperature: 37
Patient temperature: 37
TCO2: 30.6 mmol/L (ref 0–100)
TCO2: 30.7 mmol/L (ref 0–100)
pCO2 arterial: 87.9 mmHg (ref 35.0–45.0)
pH, Arterial: 7.204 — ABNORMAL LOW (ref 7.350–7.450)
pO2, Arterial: 78.7 mmHg — ABNORMAL LOW (ref 80.0–100.0)

## 2014-05-31 LAB — MRSA PCR SCREENING: MRSA by PCR: NEGATIVE

## 2014-05-31 LAB — COMPREHENSIVE METABOLIC PANEL
ALBUMIN: 3.4 g/dL — AB (ref 3.5–5.2)
ALK PHOS: 64 U/L (ref 39–117)
ALT: 29 U/L (ref 0–53)
AST: 19 U/L (ref 0–37)
Anion gap: 7 (ref 5–15)
BILIRUBIN TOTAL: 0.2 mg/dL — AB (ref 0.3–1.2)
BUN: 22 mg/dL (ref 6–23)
CHLORIDE: 102 meq/L (ref 96–112)
CO2: 33 mEq/L — ABNORMAL HIGH (ref 19–32)
Calcium: 9 mg/dL (ref 8.4–10.5)
Creatinine, Ser: 1 mg/dL (ref 0.50–1.35)
GFR calc non Af Amer: 78 mL/min — ABNORMAL LOW (ref >90)
GFR, EST AFRICAN AMERICAN: 90 mL/min — AB (ref >90)
GLUCOSE: 152 mg/dL — AB (ref 70–99)
POTASSIUM: 5.9 meq/L — AB (ref 3.7–5.3)
SODIUM: 142 meq/L (ref 137–147)
Total Protein: 6.5 g/dL (ref 6.0–8.3)

## 2014-05-31 LAB — CBC
HCT: 42 % (ref 39.0–52.0)
Hemoglobin: 12.9 g/dL — ABNORMAL LOW (ref 13.0–17.0)
MCH: 32.4 pg (ref 26.0–34.0)
MCHC: 30.7 g/dL (ref 30.0–36.0)
MCV: 105.5 fL — AB (ref 78.0–100.0)
Platelets: 214 10*3/uL (ref 150–400)
RBC: 3.98 MIL/uL — AB (ref 4.22–5.81)
RDW: 16.2 % — ABNORMAL HIGH (ref 11.5–15.5)
WBC: 5.6 10*3/uL (ref 4.0–10.5)

## 2014-05-31 MED ORDER — NICOTINE 21 MG/24HR TD PT24
21.0000 mg | MEDICATED_PATCH | Freq: Every day | TRANSDERMAL | Status: DC
  Administered 2014-05-31 – 2014-06-02 (×3): 21 mg via TRANSDERMAL
  Filled 2014-05-31 (×3): qty 1

## 2014-05-31 MED ORDER — IPRATROPIUM-ALBUTEROL 0.5-2.5 (3) MG/3ML IN SOLN
3.0000 mL | RESPIRATORY_TRACT | Status: DC
  Administered 2014-05-31 – 2014-06-02 (×15): 3 mL via RESPIRATORY_TRACT
  Filled 2014-05-31 (×14): qty 3

## 2014-05-31 MED ORDER — FUROSEMIDE 10 MG/ML IJ SOLN
20.0000 mg | Freq: Once | INTRAMUSCULAR | Status: AC
  Administered 2014-05-31: 20 mg via INTRAVENOUS
  Filled 2014-05-31: qty 2

## 2014-05-31 NOTE — Progress Notes (Signed)
Pt awake and alert and taken off bipap and placed on 4L [place].  Pt tolerating well at this time.  RT will continue to monitor.

## 2014-05-31 NOTE — Care Management Note (Addendum)
    Page 1 of 1   06/01/2014     2:30:24 PM CARE MANAGEMENT NOTE 06/01/2014  Patient:  Carlos Goodwin, Carlos Goodwin   Account Number:  [id number]  Date Initiated:  05/31/2014  Documentation initiated by:  Theophilus Kinds  Subjective/Objective Assessment:   Pt admitted from home with pneumonia and COPD. Pt lives with his wife and will return home at discharge. Pt has lung cancer and is actively undergoing chemo.  Pt is independent with ADL's.     Action/Plan:   No CM needs noted. ? need for home O2 at discharge.   Anticipated DC Date:  06/03/2014   Anticipated DC Plan:  [facility]  CM consult      Choice offered to / List presented to:             Status of service:  Completed, signed off Medicare Important Message given?  YES (If response is "NO", the following Medicare IM given date fields will be blank) Date Medicare IM given:  06/01/2014 Medicare IM given by:  Theophilus Kinds Date Additional Medicare IM given:   Additional Medicare IM given by:    Discharge Disposition:  HOME/SELF CARE  Per UR Regulation:    If discussed at Long Length of Stay Meetings, dates discussed:    Comments:  06/01/14 [place], RN BSN CM Pt potential discharge over the weekend. Pt may need home O2. CM discussed with pt and he chooses [facility] for home O2. Weekend staff can arrange.  05/31/14 [place], RN BSN CM

## 2014-05-31 NOTE — Progress Notes (Signed)
Subjective: He was admitted yesterday with healthcare associated pneumonia and respiratory failure. He's been on BiPAP overnight and his blood gas early this morning still shows significant elevation of PCO2 in significant respiratory acidemia. He says he feels okay. He is short of breath. He's had some chest pain that seems to be related to cough.  Objective: Vital signs in last 24 hours: Temp:  [97.5 F (36.4 C)-98.4 F (36.9 C)] 97.5 F (36.4 C) (10/22 0400) Pulse Rate:  [68-119] 69 (10/22 0746) Resp:  [15-26] 15 (10/22 0746) BP: (87-139)/(34-84) 102/56 mmHg (10/22 0700) SpO2:  [60 %-97 %] 94 % (10/22 0746) FiO2 (%):  [40 %-60 %] 60 % (10/22 0746) Weight:  [124.286 kg (274 lb)-129 kg (284 lb 6.3 oz)] 129 kg (284 lb 6.3 oz) (10/22 0500) Weight change:  Last BM Date: 05/30/14  Intake/Output from previous day: 10/21 0701 - 10/22 0700 In: 800 [IV Piggyback:800] Out: 850 [Urine:850]  PHYSICAL EXAM General appearance: alert, cooperative, mild distress and morbidly obese Resp: rhonchi bilaterally Cardio: regular rate and rhythm, S1, S2 normal, no murmur, click, rub or gallop GI: soft, non-tender; bowel sounds normal; no masses,  no organomegaly Extremities: extremities normal, atraumatic, no cyanosis or edema  Lab Results:  Results for orders placed during the hospital encounter of 05/30/14 (from the past 48 hour(s))  CBC WITH DIFFERENTIAL     Status: Abnormal   Collection Time    05/30/14  4:56 PM      Result Value Ref Range   WBC 7.8  4.0 - 10.5 K/uL   RBC 4.46  4.22 - 5.81 MIL/uL   Hemoglobin 14.4  13.0 - 17.0 g/dL   HCT 46.8  39.0 - 52.0 %   MCV 104.9 (*) 78.0 - 100.0 fL   MCH 32.3  26.0 - 34.0 pg   MCHC 30.8  30.0 - 36.0 g/dL   RDW 16.5 (*) 11.5 - 15.5 %   Platelets 234  150 - 400 K/uL   Neutrophils Relative % 96 (*) 43 - 77 %   Neutro Abs 7.5  1.7 - 7.7 K/uL   Lymphocytes Relative 3 (*) 12 - 46 %   Lymphs Abs 0.2 (*) 0.7 - 4.0 K/uL   Monocytes Relative 1 (*) 3 - 12  %   Monocytes Absolute 0.0 (*) 0.1 - 1.0 K/uL   Eosinophils Relative 0  0 - 5 %   Eosinophils Absolute 0.0  0.0 - 0.7 K/uL   Basophils Relative 0  0 - 1 %   Basophils Absolute 0.0  0.0 - 0.1 K/uL  COMPREHENSIVE METABOLIC PANEL     Status: Abnormal   Collection Time    05/30/14  4:56 PM      Result Value Ref Range   Sodium 140  137 - 147 mEq/L   Potassium 4.7  3.7 - 5.3 mEq/L   Chloride 98  96 - 112 mEq/L   CO2 33 (*) 19 - 32 mEq/L   Glucose, Bld 193 (*) 70 - 99 mg/dL   BUN 19  6 - 23 mg/dL   Creatinine, Ser 1.03  0.50 - 1.35 mg/dL   Calcium 10.0  8.4 - 10.5 mg/dL   Total Protein 7.6  6.0 - 8.3 g/dL   Albumin 3.9  3.5 - 5.2 g/dL   AST 23  0 - 37 U/L   ALT 29  0 - 53 U/L   Alkaline Phosphatase 79  39 - 117 U/L   Total Bilirubin 0.3  0.3 - 1.2  mg/dL   GFR calc non Af Amer 75 (*) >90 mL/min   GFR calc Af Amer 87 (*) >90 mL/min   Comment: (NOTE)     The eGFR has been calculated using the CKD EPI equation.     This calculation has not been validated in all clinical situations.     eGFR's persistently <90 mL/min signify possible Chronic Kidney     Disease.   Anion gap 9  5 - 15  TROPONIN I     Status: None   Collection Time    05/30/14  4:56 PM      Result Value Ref Range   Troponin I <0.30  <0.30 ng/mL   Comment:            Due to the release kinetics of cTnI,     a negative result within the first hours     of the onset of symptoms does not rule out     myocardial infarction with certainty.     If myocardial infarction is still suspected,     repeat the test at appropriate intervals.  PRO B NATRIURETIC PEPTIDE     Status: Abnormal   Collection Time    05/30/14  4:56 PM      Result Value Ref Range   Pro B Natriuretic peptide (BNP) 628.7 (*) 0 - 125 pg/mL  TROPONIN I     Status: None   Collection Time    05/30/14  7:45 PM      Result Value Ref Range   Troponin I <0.30  <0.30 ng/mL   Comment:            Due to the release kinetics of cTnI,     a negative result within  the first hours     of the onset of symptoms does not rule out     myocardial infarction with certainty.     If myocardial infarction is still suspected,     repeat the test at appropriate intervals.  BLOOD GAS, ARTERIAL     Status: Abnormal   Collection Time    05/30/14  8:10 PM      Result Value Ref Range   O2 Content 6.0     Delivery systems NASAL CANNULA     pH, Arterial 7.183 (*) 7.350 - 7.450   Comment: CRITICAL RESULT CALLED TO, READ BACK BY AND VERIFIED WITH:     JUSTIN MIZE,RN BY PEVIANY LAWSON,RRT      CRITICAL RESULT CALLED TO, READ BACK BY AND VERIFIED WITH:     JUSTIN MIZE,RN ON 05/30/2014 BY PEVIANY LAWSON,RRT AT 2021.   pCO2 arterial 92.8 (*) 35.0 - 45.0 mmHg   Comment: CRITICAL RESULT CALLED TO, READ BACK BY AND VERIFIED WITH:     JUSTIN MIZE,RN ON 05/30/2014 BY PEVIANY LAWSON,RRT AT 2021.   pO2, Arterial 76.6 (*) 80.0 - 100.0 mmHg   Bicarbonate 33.5 (*) 20.0 - 24.0 mEq/L   TCO2 31.4  0 - 100 mmol/L   Acid-Base Excess 5.5 (*) 0.0 - 2.0 mmol/L   O2 Saturation 91.7     Patient temperature 37.0     Collection site RIGHT RADIAL     Drawn by [phone]     Sample type ARTERIAL     Allens test (pass/fail) PASS  PASS  MRSA PCR SCREENING     Status: None   Collection Time    05/30/14  9:20 PM      Result Value Ref Range   MRSA  by PCR NEGATIVE  NEGATIVE   Comment:            The GeneXpert MRSA Assay (FDA     approved for NASAL specimens     only), is one component of a     comprehensive MRSA colonization     surveillance program. It is not     intended to diagnose MRSA     infection nor to guide or     monitor treatment for     MRSA infections.  BLOOD GAS, ARTERIAL     Status: Abnormal   Collection Time    05/31/14 12:00 AM      Result Value Ref Range   FIO2 55.00     Delivery systems BILEVEL POSITIVE AIRWAY PRESSURE     Inspiratory PAP 17     Expiratory PAP 5     pH, Arterial 7.204 (*) 7.350 - 7.450   pCO2 arterial 85.8 (*) 35.0 - 45.0 mmHg   Comment: CRITICAL  RESULT CALLED TO, READ BACK BY AND VERIFIED WITH:     ROBBIW WAGNOR RN BY K KNICK RRT,RCP ON 05/31/2014 AT 0004   pO2, Arterial 68.0 (*) 80.0 - 100.0 mmHg   Bicarbonate 32.6 (*) 20.0 - 24.0 mEq/L   TCO2 30.6  0 - 100 mmol/L   Acid-Base Excess 5.1 (*) 0.0 - 2.0 mmol/L   O2 Saturation 89.5     Patient temperature 37.0     Collection site RIGHT RADIAL     Drawn by [id number]     Sample type ARTERIAL     Allens test (pass/fail) PASS  PASS  TROPONIN I     Status: None   Collection Time    05/31/14 12:31 AM      Result Value Ref Range   Troponin I <0.30  <0.30 ng/mL   Comment:            Due to the release kinetics of cTnI,     a negative result within the first hours     of the onset of symptoms does not rule out     myocardial infarction with certainty.     If myocardial infarction is still suspected,     repeat the test at appropriate intervals.  COMPREHENSIVE METABOLIC PANEL     Status: Abnormal   Collection Time    05/31/14  4:39 AM      Result Value Ref Range   Sodium 142  137 - 147 mEq/L   Potassium 5.9 (*) 3.7 - 5.3 mEq/L   Comment: DELTA CHECK NOTED   Chloride 102  96 - 112 mEq/L   CO2 33 (*) 19 - 32 mEq/L   Glucose, Bld 152 (*) 70 - 99 mg/dL   BUN 22  6 - 23 mg/dL   Creatinine, Ser 1.00  0.50 - 1.35 mg/dL   Calcium 9.0  8.4 - 10.5 mg/dL   Total Protein 6.5  6.0 - 8.3 g/dL   Albumin 3.4 (*) 3.5 - 5.2 g/dL   AST 19  0 - 37 U/L   ALT 29  0 - 53 U/L   Alkaline Phosphatase 64  39 - 117 U/L   Total Bilirubin 0.2 (*) 0.3 - 1.2 mg/dL   GFR calc non Af Amer 78 (*) >90 mL/min   GFR calc Af Amer 90 (*) >90 mL/min   Comment: (NOTE)     The eGFR has been calculated using the CKD EPI equation.     This calculation has not  been validated in all clinical situations.     eGFR's persistently <90 mL/min signify possible Chronic Kidney     Disease.   Anion gap 7  5 - 15  CBC     Status: Abnormal   Collection Time    05/31/14  4:39 AM      Result Value Ref Range   WBC 5.6  4.0 - 10.5  K/uL   RBC 3.98 (*) 4.22 - 5.81 MIL/uL   Hemoglobin 12.9 (*) 13.0 - 17.0 g/dL   HCT 42.0  39.0 - 52.0 %   MCV 105.5 (*) 78.0 - 100.0 fL   MCH 32.4  26.0 - 34.0 pg   MCHC 30.7  30.0 - 36.0 g/dL   RDW 16.2 (*) 11.5 - 15.5 %   Platelets 214  150 - 400 K/uL  BLOOD GAS, ARTERIAL     Status: Abnormal   Collection Time    05/31/14  4:45 AM      Result Value Ref Range   FIO2 60.00     Delivery systems BILEVEL POSITIVE AIRWAY PRESSURE     Inspiratory PAP 20     Expiratory PAP 5     pH, Arterial 7.196 (*) 7.350 - 7.450   Comment: CRITICAL RESULT CALLED TO, READ BACK BY AND VERIFIED WITH:     ROBBIE WAGONER RN BY K KNICK RRT RCP ON 05/31/2014 AT 0448   pCO2 arterial 87.9 (*) 35.0 - 45.0 mmHg   Comment: CRITICAL RESULT CALLED TO, READ BACK BY AND VERIFIED WITH:     ROBBIE WAGONER RN BY K KNCK RRT RCP ON 05/31/2014 AT 0448   pO2, Arterial 78.7 (*) 80.0 - 100.0 mmHg   Bicarbonate 32.7 (*) 20.0 - 24.0 mEq/L   TCO2 30.7  0 - 100 mmol/L   Acid-Base Excess 5.1 (*) 0.0 - 2.0 mmol/L   O2 Saturation 92.5     Patient temperature 37.0     Collection site RIGHT RADIAL     Drawn by [id number]     Sample type ARTERIAL     Allens test (pass/fail) PASS  PASS    ABGS  Recent Labs  05/31/14 0445  PHART 7.196*  PO2ART 78.7*  TCO2 30.7  HCO3 32.7*   CULTURES Recent Results (from the past 240 hour(s))  MRSA PCR SCREENING     Status: None   Collection Time    05/30/14  9:20 PM      Result Value Ref Range Status   MRSA by PCR NEGATIVE  NEGATIVE Final   Comment:            The GeneXpert MRSA Assay (FDA     approved for NASAL specimens     only), is one component of a     comprehensive MRSA colonization     surveillance program. It is not     intended to diagnose MRSA     infection nor to guide or     monitor treatment for     MRSA infections.   Studies/Results: Dg Chest 2 View  05/30/2014   CLINICAL DATA:  Chest pain radiating down the left arm. Shortness of breath. Lung cancer. coronary  artery disease.  EXAM: CHEST  2 VIEW  COMPARISON:  05/21/2014  FINDINGS: Small right pleural effusion. Calcified pleural plaques bilaterally. The right upper lobe primary lung malignancy is relatively indistinct on today's exam. Fullness of the right hilum. No pneumothorax.  IMPRESSION: 1. Accentuated right hilar prominence, potentially vascular but possibly from adenopathy. If  the patient has onset of symptoms raises concern for pulmonary embolus then chest CT angiogram would be suggested. 2. Small right pleural effusion. Chronic left pleural thickening with bilateral calcified pleural plaques. 3. No overt pulmonary edema.   Electronically Signed   By: Sherryl Barters M.D.   On: 05/30/2014 18:11   Ct Angio Chest Pe W/cm &/or Wo Cm  05/30/2014   CLINICAL DATA:  Chest pain and shortness of breath  EXAM: CT ANGIOGRAPHY CHEST WITH CONTRAST  TECHNIQUE: Multidetector CT imaging of the chest was performed using the standard protocol during bolus administration of intravenous contrast. Multiplanar CT image reconstructions and MIPs were obtained to evaluate the vascular anatomy.  CONTRAST:  162mL OMNIPAQUE IOHEXOL 350 MG/ML SOLN  COMPARISON:  05/21/2014  FINDINGS: There again noted chronic changes in the left hemi thorax with calcified pleural plaques and mild scarring. Increased density is identified however in the posterior costophrenic angle on the left best seen on image number 83 of series 6. This measures approximately 2.9 x 2.2 cm. It has increased in size from the prior exam at which time it measured 2.0 x 1.7 cm. Calcified pleural plaques are again noted on the right. The known right upper lobe mass lesion is stable in appearance when compare with the prior exam. Increasing right-sided pleural effusion is noted however.  The aorta and pulmonary artery are well visualized. No evidence of pulmonary emboli are seen. Persistent right hilar lymph node is identified. It now measures approximately 16 mm in short  axis. No other significant lymphadenopathy is noted. Visualized upper abdomen is within normal limits. The vague hypodense lesion in the liver was not covered on this examination. The osseous structures show no acute abnormality.  Review of the MIP images confirms the above findings.  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  Stable appearing right upper lobe mass lesion. There is however in enlarging right-sided pleural effusion when compared with the prior study.  Although not mentioned in the body of the report, there is increasing infiltrate in the right middle lobe with volume loss in the right middle lobe. This would account in part for the fullness of the right hilum on the plain film examination.  No evidence of pulmonary emboli.  Persistent and mildly enlarged right hilar lymph node.  Increase in the degree of infiltrated density in the posterior aspect of the left lower lobe. This is increased significantly in the past 9 days and likely represents pneumonic infiltrate.   Electronically Signed   By: Inez Catalina M.D.   On: 05/30/2014 19:11    Medications:  Prior to Admission:  Prescriptions prior to admission  Medication Sig Dispense Refill  . aspirin EC 81 MG tablet Take 81 mg by mouth at bedtime.      . metoprolol (LOPRESSOR) 100 MG tablet Take 100 mg by mouth at bedtime.      . ranitidine (ZANTAC) 150 MG tablet Take 150 mg by mouth daily.      . simvastatin (ZOCOR) 40 MG tablet Take 40 mg by mouth every evening.      . tiotropium (SPIRIVA) 18 MCG inhalation capsule Place 18 mcg into inhaler and inhale daily.      Marland Kitchen albuterol (PROVENTIL HFA;VENTOLIN HFA) 108 (90 BASE) MCG/ACT inhaler Inhale 1-2 puffs into the lungs every 6 (six) hours as needed for wheezing or shortness of breath.      . prochlorperazine (COMPAZINE) 10 MG tablet Take 1 tablet (10 mg  total) by mouth every 6 (six) hours as needed for nausea or vomiting.  60  tablet  0  . Wound Cleansers (RADIAPLEX EX) Apply topically.       Scheduled: . aspirin EC  81 mg Oral QHS  . ceFEPime (MAXIPIME) IV  1 g Intravenous 3 times per day  . famotidine  20 mg Oral Daily  . heparin  5,000 Units Subcutaneous 3 times per day  . ipratropium-albuterol  3 mL Nebulization Q4H  . metoprolol  100 mg Oral QHS  . senna  1 tablet Oral BID  . simvastatin  40 mg Oral QPM  . sodium chloride  3 mL Intravenous Q12H  . vancomycin  1,000 mg Intravenous Q8H   Continuous:  ILN:[id number], acetaminophen, gi cocktail, morphine injection, polyethylene glycol, prochlorperazine  Assesment: He has healthcare associated pneumonia. He has COPD at baseline. He has acute respiratory failure and I suspect he has some element of chronic respiratory failure as well based on his blood gases. He has lung cancer and is undergoing chemotherapy. This appears to be stable and in fact he says he's been improving. 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 No changes in treatment.    LOS: 1 day   Nezar Buckles L 05/31/2014, 8:04 AM

## 2014-06-01 LAB — BASIC METABOLIC PANEL
ANION GAP: 8 (ref 5–15)
BUN: 31 mg/dL — AB (ref 6–23)
CALCIUM: 8.8 mg/dL (ref 8.4–10.5)
CHLORIDE: 99 meq/L (ref 96–112)
CO2: 34 mEq/L — ABNORMAL HIGH (ref 19–32)
CREATININE: 1.06 mg/dL (ref 0.50–1.35)
GFR calc Af Amer: 84 mL/min — ABNORMAL LOW (ref >90)
GFR calc non Af Amer: 72 mL/min — ABNORMAL LOW (ref >90)
Glucose, Bld: 99 mg/dL (ref 70–99)
Potassium: 4.9 mEq/L (ref 3.7–5.3)
Sodium: 141 mEq/L (ref 137–147)

## 2014-06-01 LAB — CBC WITH DIFFERENTIAL/PLATELET
BASOS ABS: 0 10*3/uL (ref 0.0–0.1)
BASOS PCT: 0 % (ref 0–1)
EOS PCT: 0 % (ref 0–5)
Eosinophils Absolute: 0 10*3/uL (ref 0.0–0.7)
HEMATOCRIT: 40.7 % (ref 39.0–52.0)
HEMOGLOBIN: 12.3 g/dL — AB (ref 13.0–17.0)
Lymphocytes Relative: 4 % — ABNORMAL LOW (ref 12–46)
Lymphs Abs: 0.2 10*3/uL — ABNORMAL LOW (ref 0.7–4.0)
MCH: 32.4 pg (ref 26.0–34.0)
MCHC: 30.2 g/dL (ref 30.0–36.0)
MCV: 107.1 fL — AB (ref 78.0–100.0)
MONO ABS: 0.5 10*3/uL (ref 0.1–1.0)
MONOS PCT: 9 % (ref 3–12)
Neutro Abs: 4.7 10*3/uL (ref 1.7–7.7)
Neutrophils Relative %: 87 % — ABNORMAL HIGH (ref 43–77)
Platelets: 168 10*3/uL (ref 150–400)
RBC: 3.8 MIL/uL — ABNORMAL LOW (ref 4.22–5.81)
RDW: 16.4 % — AB (ref 11.5–15.5)
WBC: 5.4 10*3/uL (ref 4.0–10.5)

## 2014-06-01 LAB — LEGIONELLA ANTIGEN, URINE

## 2014-06-01 LAB — STREP PNEUMONIAE URINARY ANTIGEN: STREP PNEUMO URINARY ANTIGEN: NEGATIVE

## 2014-06-01 NOTE — Progress Notes (Signed)
Subjective: He has made a remarkable improvement. He is off BiPAP. He feels well. He is still coughing. He is still somewhat short of breath but improved  Objective: Vital signs in last 24 hours: Temp:  [97.5 F (36.4 C)-98.4 F (36.9 C)] 97.7 F (36.5 C) (10/23 0400) Pulse Rate:  [66-88] 74 (10/23 0700) Resp:  [13-23] 18 (10/23 0700) BP: (83-130)/(39-98) 87/39 mmHg (10/23 0700) SpO2:  [84 %-100 %] 97 % (10/23 0720) FiO2 (%):  [40 %] 40 % (10/23 0452) Weight:  [129.9 kg (286 lb 6 oz)] 129.9 kg (286 lb 6 oz) (10/23 0500) Weight change: 5.614 kg (12 lb 6 oz) Last BM Date: 05/30/14  Intake/Output from previous day: 10/22 0701 - 10/23 0700 In: 750 [IV Piggyback:750] Out: 1025 [Urine:1025]  PHYSICAL EXAM General appearance: alert, cooperative, mild distress and morbidly obese Resp: rhonchi bilaterally Cardio: regular rate and rhythm, S1, S2 normal, no murmur, click, rub or gallop GI: soft, non-tender; bowel sounds normal; no masses,  no organomegaly Extremities: extremities normal, atraumatic, no cyanosis or edema  Lab Results:  Results for orders placed during the hospital encounter of 05/30/14 (from the past 48 hour(s))  CBC WITH DIFFERENTIAL     Status: Abnormal   Collection Time    05/30/14  4:56 PM      Result Value Ref Range   WBC 7.8  4.0 - 10.5 K/uL   RBC 4.46  4.22 - 5.81 MIL/uL   Hemoglobin 14.4  13.0 - 17.0 g/dL   HCT 46.8  39.0 - 52.0 %   MCV 104.9 (*) 78.0 - 100.0 fL   MCH 32.3  26.0 - 34.0 pg   MCHC 30.8  30.0 - 36.0 g/dL   RDW 16.5 (*) 11.5 - 15.5 %   Platelets 234  150 - 400 K/uL   Neutrophils Relative % 96 (*) 43 - 77 %   Neutro Abs 7.5  1.7 - 7.7 K/uL   Lymphocytes Relative 3 (*) 12 - 46 %   Lymphs Abs 0.2 (*) 0.7 - 4.0 K/uL   Monocytes Relative 1 (*) 3 - 12 %   Monocytes Absolute 0.0 (*) 0.1 - 1.0 K/uL   Eosinophils Relative 0  0 - 5 %   Eosinophils Absolute 0.0  0.0 - 0.7 K/uL   Basophils Relative 0  0 - 1 %   Basophils Absolute 0.0  0.0 - 0.1  K/uL  COMPREHENSIVE METABOLIC PANEL     Status: Abnormal   Collection Time    05/30/14  4:56 PM      Result Value Ref Range   Sodium 140  137 - 147 mEq/L   Potassium 4.7  3.7 - 5.3 mEq/L   Chloride 98  96 - 112 mEq/L   CO2 33 (*) 19 - 32 mEq/L   Glucose, Bld 193 (*) 70 - 99 mg/dL   BUN 19  6 - 23 mg/dL   Creatinine, Ser 1.03  0.50 - 1.35 mg/dL   Calcium 10.0  8.4 - 10.5 mg/dL   Total Protein 7.6  6.0 - 8.3 g/dL   Albumin 3.9  3.5 - 5.2 g/dL   AST 23  0 - 37 U/L   ALT 29  0 - 53 U/L   Alkaline Phosphatase 79  39 - 117 U/L   Total Bilirubin 0.3  0.3 - 1.2 mg/dL   GFR calc non Af Amer 75 (*) >90 mL/min   GFR calc Af Amer 87 (*) >90 mL/min   Comment: (NOTE)  The eGFR has been calculated using the CKD EPI equation.     This calculation has not been validated in all clinical situations.     eGFR's persistently <90 mL/min signify possible Chronic Kidney     Disease.   Anion gap 9  5 - 15  TROPONIN I     Status: None   Collection Time    05/30/14  4:56 PM      Result Value Ref Range   Troponin I <0.30  <0.30 ng/mL   Comment:            Due to the release kinetics of cTnI,     a negative result within the first hours     of the onset of symptoms does not rule out     myocardial infarction with certainty.     If myocardial infarction is still suspected,     repeat the test at appropriate intervals.  PRO B NATRIURETIC PEPTIDE     Status: Abnormal   Collection Time    05/30/14  4:56 PM      Result Value Ref Range   Pro B Natriuretic peptide (BNP) 628.7 (*) 0 - 125 pg/mL  CULTURE, BLOOD (ROUTINE X 2)     Status: None   Collection Time    05/30/14  7:36 PM      Result Value Ref Range   Specimen Description BLOOD RIGHT HAND     Special Requests BOTTLES DRAWN AEROBIC AND ANAEROBIC 6CC     Culture NO GROWTH 1 DAY     Report Status PENDING    CULTURE, BLOOD (ROUTINE X 2)     Status: None   Collection Time    05/30/14  7:45 PM      Result Value Ref Range   Specimen Description  BLOOD LEFT HAND     Special Requests BOTTLES DRAWN AEROBIC AND ANAEROBIC 6CC     Culture NO GROWTH 1 DAY     Report Status PENDING    TROPONIN I     Status: None   Collection Time    05/30/14  7:45 PM      Result Value Ref Range   Troponin I <0.30  <0.30 ng/mL   Comment:            Due to the release kinetics of cTnI,     a negative result within the first hours     of the onset of symptoms does not rule out     myocardial infarction with certainty.     If myocardial infarction is still suspected,     repeat the test at appropriate intervals.  BLOOD GAS, ARTERIAL     Status: Abnormal   Collection Time    05/30/14  8:10 PM      Result Value Ref Range   O2 Content 6.0     Delivery systems NASAL CANNULA     pH, Arterial 7.183 (*) 7.350 - 7.450   Comment: CRITICAL RESULT CALLED TO, READ BACK BY AND VERIFIED WITH:     JUSTIN MIZE,RN BY PEVIANY LAWSON,RRT      CRITICAL RESULT CALLED TO, READ BACK BY AND VERIFIED WITH:     JUSTIN MIZE,RN ON 05/30/2014 BY PEVIANY LAWSON,RRT AT 2021.   pCO2 arterial 92.8 (*) 35.0 - 45.0 mmHg   Comment: CRITICAL RESULT CALLED TO, READ BACK BY AND VERIFIED WITH:     JUSTIN MIZE,RN ON 05/30/2014 BY PEVIANY LAWSON,RRT AT 2021.   pO2, Arterial 76.6 (*) 80.0 -  100.0 mmHg   Bicarbonate 33.5 (*) 20.0 - 24.0 mEq/L   TCO2 31.4  0 - 100 mmol/L   Acid-Base Excess 5.5 (*) 0.0 - 2.0 mmol/L   O2 Saturation 91.7     Patient temperature 37.0     Collection site RIGHT RADIAL     Drawn by [phone]     Sample type ARTERIAL     Allens test (pass/fail) PASS  PASS  MRSA PCR SCREENING     Status: None   Collection Time    05/30/14  9:20 PM      Result Value Ref Range   MRSA by PCR NEGATIVE  NEGATIVE   Comment:            The GeneXpert MRSA Assay (FDA     approved for NASAL specimens     only), is one component of a     comprehensive MRSA colonization     surveillance program. It is not     intended to diagnose MRSA     infection nor to guide or     monitor treatment  for     MRSA infections.  BLOOD GAS, ARTERIAL     Status: Abnormal   Collection Time    05/31/14 12:00 AM      Result Value Ref Range   FIO2 55.00     Delivery systems BILEVEL POSITIVE AIRWAY PRESSURE     Inspiratory PAP 17     Expiratory PAP 5     pH, Arterial 7.204 (*) 7.350 - 7.450   pCO2 arterial 85.8 (*) 35.0 - 45.0 mmHg   Comment: CRITICAL RESULT CALLED TO, READ BACK BY AND VERIFIED WITH:     ROBBIW WAGNOR RN BY K KNICK RRT,RCP ON 05/31/2014 AT 0004   pO2, Arterial 68.0 (*) 80.0 - 100.0 mmHg   Bicarbonate 32.6 (*) 20.0 - 24.0 mEq/L   TCO2 30.6  0 - 100 mmol/L   Acid-Base Excess 5.1 (*) 0.0 - 2.0 mmol/L   O2 Saturation 89.5     Patient temperature 37.0     Collection site RIGHT RADIAL     Drawn by [id number]     Sample type ARTERIAL     Allens test (pass/fail) PASS  PASS  HIV ANTIBODY (ROUTINE TESTING)     Status: None   Collection Time    05/31/14 12:31 AM      Result Value Ref Range   HIV 1&2 Ab, 4th Generation NONREACTIVE  NONREACTIVE   Comment: (NOTE)     A NONREACTIVE HIV Ag/Ab result does not exclude HIV infection since     the time frame for seroconversion is variable. If acute HIV infection     is suspected, a HIV-1 RNA Qualitative TMA test is recommended.     HIV-1/2 Antibody Diff         Not indicated.     HIV-1 RNA, Qual TMA           Not indicated.     PLEASE NOTE: This information has been disclosed to you from records     whose confidentiality may be protected by state law. If your state     requires such protection, then the state law prohibits you from making     any further disclosure of the information without the specific written     consent of the person to whom it pertains, or as otherwise permitted     by law. A general authorization for the release of medical or other  information is NOT sufficient for this purpose.     The performance of this assay has not been clinically validated in     patients less than 56 years old.     Performed at [facility]  TROPONIN I     Status: None   Collection Time    05/31/14 12:31 AM      Result Value Ref Range   Troponin I <0.30  <0.30 ng/mL   Comment:            Due to the release kinetics of cTnI,     a negative result within the first hours     of the onset of symptoms does not rule out     myocardial infarction with certainty.     If myocardial infarction is still suspected,     repeat the test at appropriate intervals.  COMPREHENSIVE METABOLIC PANEL     Status: Abnormal   Collection Time    05/31/14  4:39 AM      Result Value Ref Range   Sodium 142  137 - 147 mEq/L   Potassium 5.9 (*) 3.7 - 5.3 mEq/L   Comment: DELTA CHECK NOTED   Chloride 102  96 - 112 mEq/L   CO2 33 (*) 19 - 32 mEq/L   Glucose, Bld 152 (*) 70 - 99 mg/dL   BUN 22  6 - 23 mg/dL   Creatinine, Ser 1.00  0.50 - 1.35 mg/dL   Calcium 9.0  8.4 - 10.5 mg/dL   Total Protein 6.5  6.0 - 8.3 g/dL   Albumin 3.4 (*) 3.5 - 5.2 g/dL   AST 19  0 - 37 U/L   ALT 29  0 - 53 U/L   Alkaline Phosphatase 64  39 - 117 U/L   Total Bilirubin 0.2 (*) 0.3 - 1.2 mg/dL   GFR calc non Af Amer 78 (*) >90 mL/min   GFR calc Af Amer 90 (*) >90 mL/min   Comment: (NOTE)     The eGFR has been calculated using the CKD EPI equation.     This calculation has not been validated in all clinical situations.     eGFR's persistently <90 mL/min signify possible Chronic Kidney     Disease.   Anion gap 7  5 - 15  CBC     Status: Abnormal   Collection Time    05/31/14  4:39 AM      Result Value Ref Range   WBC 5.6  4.0 - 10.5 K/uL   RBC 3.98 (*) 4.22 - 5.81 MIL/uL   Hemoglobin 12.9 (*) 13.0 - 17.0 g/dL   HCT 42.0  39.0 - 52.0 %   MCV 105.5 (*) 78.0 - 100.0 fL   MCH 32.4  26.0 - 34.0 pg   MCHC 30.7  30.0 - 36.0 g/dL   RDW 16.2 (*) 11.5 - 15.5 %   Platelets 214  150 - 400 K/uL  BLOOD GAS, ARTERIAL     Status: Abnormal   Collection Time    05/31/14  4:45 AM      Result Value Ref Range   FIO2 60.00     Delivery systems BILEVEL POSITIVE  AIRWAY PRESSURE     Inspiratory PAP 20     Expiratory PAP 5     pH, Arterial 7.196 (*) 7.350 - 7.450   Comment: CRITICAL RESULT CALLED TO, READ BACK BY AND VERIFIED WITH:     ROBBIE WAGONER RN BY K KNICK RRT  RCP ON 05/31/2014 AT 0448   pCO2 arterial 87.9 (*) 35.0 - 45.0 mmHg   Comment: CRITICAL RESULT CALLED TO, READ BACK BY AND VERIFIED WITH:     ROBBIE WAGONER RN BY K KNCK RRT RCP ON 05/31/2014 AT 0448   pO2, Arterial 78.7 (*) 80.0 - 100.0 mmHg   Bicarbonate 32.7 (*) 20.0 - 24.0 mEq/L   TCO2 30.7  0 - 100 mmol/L   Acid-Base Excess 5.1 (*) 0.0 - 2.0 mmol/L   O2 Saturation 92.5     Patient temperature 37.0     Collection site RIGHT RADIAL     Drawn by [id number]     Sample type ARTERIAL     Allens test (pass/fail) PASS  PASS  STREP PNEUMONIAE URINARY ANTIGEN     Status: None   Collection Time    05/31/14  2:25 PM      Result Value Ref Range   Strep Pneumo Urinary Antigen NEGATIVE  NEGATIVE   Comment:            Infection due to S. pneumoniae     cannot be absolutely ruled out     since the antigen present     may be below the detection limit     of the test.     Performed at [facility]  CBC WITH DIFFERENTIAL     Status: Abnormal   Collection Time    06/01/14  4:44 AM      Result Value Ref Range   WBC 5.4  4.0 - 10.5 K/uL   RBC 3.80 (*) 4.22 - 5.81 MIL/uL   Hemoglobin 12.3 (*) 13.0 - 17.0 g/dL   HCT 40.7  39.0 - 52.0 %   MCV 107.1 (*) 78.0 - 100.0 fL   MCH 32.4  26.0 - 34.0 pg   MCHC 30.2  30.0 - 36.0 g/dL   RDW 16.4 (*) 11.5 - 15.5 %   Platelets 168  150 - 400 K/uL   Neutrophils Relative % 87 (*) 43 - 77 %   Neutro Abs 4.7  1.7 - 7.7 K/uL   Lymphocytes Relative 4 (*) 12 - 46 %   Lymphs Abs 0.2 (*) 0.7 - 4.0 K/uL   Monocytes Relative 9  3 - 12 %   Monocytes Absolute 0.5  0.1 - 1.0 K/uL   Eosinophils Relative 0  0 - 5 %   Eosinophils Absolute 0.0  0.0 - 0.7 K/uL   Basophils Relative 0  0 - 1 %   Basophils Absolute 0.0  0.0 - 0.1 K/uL  BASIC METABOLIC PANEL      Status: Abnormal   Collection Time    06/01/14  4:44 AM      Result Value Ref Range   Sodium 141  137 - 147 mEq/L   Potassium 4.9  3.7 - 5.3 mEq/L   Comment: DELTA CHECK NOTED   Chloride 99  96 - 112 mEq/L   CO2 34 (*) 19 - 32 mEq/L   Glucose, Bld 99  70 - 99 mg/dL   BUN 31 (*) 6 - 23 mg/dL   Creatinine, Ser 1.06  0.50 - 1.35 mg/dL   Calcium 8.8  8.4 - 10.5 mg/dL   GFR calc non Af Amer 72 (*) >90 mL/min   GFR calc Af Amer 84 (*) >90 mL/min   Comment: (NOTE)     The eGFR has been calculated using the CKD EPI equation.     This calculation has not been validated  in all clinical situations.     eGFR's persistently <90 mL/min signify possible Chronic Kidney     Disease.   Anion gap 8  5 - 15    ABGS  Recent Labs  05/31/14 0445  PHART 7.196*  PO2ART 78.7*  TCO2 30.7  HCO3 32.7*   CULTURES Recent Results (from the past 240 hour(s))  CULTURE, BLOOD (ROUTINE X 2)     Status: None   Collection Time    05/30/14  7:36 PM      Result Value Ref Range Status   Specimen Description BLOOD RIGHT HAND   Final   Special Requests BOTTLES DRAWN AEROBIC AND ANAEROBIC 6CC   Final   Culture NO GROWTH 1 DAY   Final   Report Status PENDING   Incomplete  CULTURE, BLOOD (ROUTINE X 2)     Status: None   Collection Time    05/30/14  7:45 PM      Result Value Ref Range Status   Specimen Description BLOOD LEFT HAND   Final   Special Requests BOTTLES DRAWN AEROBIC AND ANAEROBIC 6CC   Final   Culture NO GROWTH 1 DAY   Final   Report Status PENDING   Incomplete  MRSA PCR SCREENING     Status: None   Collection Time    05/30/14  9:20 PM      Result Value Ref Range Status   MRSA by PCR NEGATIVE  NEGATIVE Final   Comment:            The GeneXpert MRSA Assay (FDA     approved for NASAL specimens     only), is one component of a     comprehensive MRSA colonization     surveillance program. It is not     intended to diagnose MRSA     infection nor to guide or     monitor treatment for      MRSA infections.   Studies/Results: Dg Chest 2 View  05/30/2014   CLINICAL DATA:  Chest pain radiating down the left arm. Shortness of breath. Lung cancer. coronary artery disease.  EXAM: CHEST  2 VIEW  COMPARISON:  05/21/2014  FINDINGS: Small right pleural effusion. Calcified pleural plaques bilaterally. The right upper lobe primary lung malignancy is relatively indistinct on today's exam. Fullness of the right hilum. No pneumothorax.  IMPRESSION: 1. Accentuated right hilar prominence, potentially vascular but possibly from adenopathy. If the patient has onset of symptoms raises concern for pulmonary embolus then chest CT angiogram would be suggested. 2. Small right pleural effusion. Chronic left pleural thickening with bilateral calcified pleural plaques. 3. No overt pulmonary edema.   Electronically Signed   By: Sherryl Barters M.D.   On: 05/30/2014 18:11   Ct Angio Chest Pe W/cm &/or Wo Cm  05/30/2014   CLINICAL DATA:  Chest pain and shortness of breath  EXAM: CT ANGIOGRAPHY CHEST WITH CONTRAST  TECHNIQUE: Multidetector CT imaging of the chest was performed using the standard protocol during bolus administration of intravenous contrast. Multiplanar CT image reconstructions and MIPs were obtained to evaluate the vascular anatomy.  CONTRAST:  179m OMNIPAQUE IOHEXOL 350 MG/ML SOLN  COMPARISON:  05/21/2014  FINDINGS: There again noted chronic changes in the left hemi thorax with calcified pleural plaques and mild scarring. Increased density is identified however in the posterior costophrenic angle on the left best seen on image number 83 of series 6. This measures approximately 2.9 x 2.2 cm. It has increased in size from  the prior exam at which time it measured 2.0 x 1.7 cm. Calcified pleural plaques are again noted on the right. The known right upper lobe mass lesion is stable in appearance when compare with the prior exam. Increasing right-sided pleural effusion is noted however.  The aorta and  pulmonary artery are well visualized. No evidence of pulmonary emboli are seen. Persistent right hilar lymph node is identified. It now measures approximately 16 mm in short axis. No other significant lymphadenopathy is noted. Visualized upper abdomen is within normal limits. The vague hypodense lesion in the liver was not covered on this examination. The osseous structures show no acute abnormality.  Review of the MIP images confirms the above findings.  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  Stable appearing right upper lobe mass lesion. There is however in enlarging right-sided pleural effusion when compared with the prior study.  Although not mentioned in the body of the report, there is increasing infiltrate in the right middle lobe with volume loss in the right middle lobe. This would account in part for the fullness of the right hilum on the plain film examination.  No evidence of pulmonary emboli.  Persistent and mildly enlarged right hilar lymph node.  Increase in the degree of infiltrated density in the posterior aspect of the left lower lobe. This is increased significantly in the past 9 days and likely represents pneumonic infiltrate.   Electronically Signed   By: Inez Catalina M.D.   On: 05/30/2014 19:11   Portable Chest 1 View  05/31/2014   CLINICAL DATA:  63 year old with healthcare associated pneumonia. History of COPD and lung cancer. Patient is undergoing chemotherapy.  EXAM: PORTABLE CHEST - 1 VIEW  COMPARISON:  05/30/2014  FINDINGS: Increasing densities in the right lower chest and right mid lung. Streaky densities at the left lung base. Heart size is grossly stable. The trachea is midline.  IMPRESSION: Increased densities in the right mid and lower chest. Findings suggest a combination of pleural fluid and atelectasis. Infection cannot be excluded.  Densities at the left lung base are most compatible with atelectasis.    Electronically Signed   By: Markus Daft M.D.   On: 05/31/2014 08:13    Medications:  Prior to Admission:  Prescriptions prior to admission  Medication Sig Dispense Refill  . aspirin EC 81 MG tablet Take 81 mg by mouth at bedtime.      . metoprolol (LOPRESSOR) 100 MG tablet Take 100 mg by mouth at bedtime.      . ranitidine (ZANTAC) 150 MG tablet Take 150 mg by mouth daily.      . simvastatin (ZOCOR) 40 MG tablet Take 40 mg by mouth every evening.      . tiotropium (SPIRIVA) 18 MCG inhalation capsule Place 18 mcg into inhaler and inhale daily.      Marland Kitchen albuterol (PROVENTIL HFA;VENTOLIN HFA) 108 (90 BASE) MCG/ACT inhaler Inhale 1-2 puffs into the lungs every 6 (six) hours as needed for wheezing or shortness of breath.      . prochlorperazine (COMPAZINE) 10 MG tablet Take 1 tablet (10 mg total) by mouth every 6 (six) hours as needed for nausea or vomiting.  60 tablet  0  . Wound Cleansers (RADIAPLEX EX) Apply topically.       Scheduled: . aspirin EC  81 mg Oral QHS  . ceFEPime (MAXIPIME) IV  1 g Intravenous 3 times per day  . famotidine  20 mg Oral Daily  .  heparin  5,000 Units Subcutaneous 3 times per day  . ipratropium-albuterol  3 mL Nebulization Q4H  . metoprolol  100 mg Oral QHS  . nicotine  21 mg Transdermal Daily  . senna  1 tablet Oral BID  . simvastatin  40 mg Oral QPM  . sodium chloride  3 mL Intravenous Q12H  . vancomycin  1,000 mg Intravenous Q8H   Continuous:  KIS:[id number], acetaminophen, gi cocktail, morphine injection, polyethylene glycol, prochlorperazine  Assesment: He was admitted with healthcare associated pneumonia and acute respiratory failure. He is improved today. He is on appropriate treatment for healthcare associated pneumonia. 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 I don't think he requires intensive care unit or step down care any longer so on going to plan to transfer him to the floor Active Problems:   HCAP  (healthcare-associated pneumonia)    Plan: Transfer from ICU continue IV antibiotics etc.    LOS: 2 days   Meilani Edmundson L 06/01/2014, 8:15 AM

## 2014-06-01 NOTE — Progress Notes (Signed)
Patient wore BIPAP in ICU last night-transferred to floor today, no unit was transferred with him, asked if he wanted to wear a CPAP tonight and he said no not tonight. Patient is on 3LNC and tolerating well. RT will continue to monitor.

## 2014-06-01 NOTE — Progress Notes (Signed)
Pt moved to room 325. Pt was transported via wheelchair with Christiana @ 3L. No distress on transfer. A call was made to his spouse's cell phone and home number with no response.

## 2014-06-02 LAB — VANCOMYCIN, TROUGH: Vancomycin Tr: 25.1 ug/mL (ref 10.0–20.0)

## 2014-06-02 MED ORDER — LEVOFLOXACIN 500 MG PO TABS: 500.0000 mg | ORAL_TABLET | Freq: Every day | ORAL | Status: AC

## 2014-06-02 MED ORDER — VANCOMYCIN HCL 10 G IV SOLR
1250.0000 mg | Freq: Two times a day (BID) | INTRAVENOUS | Status: DC
  Filled 2014-06-02 (×4): qty 1250

## 2014-06-02 NOTE — Progress Notes (Signed)
O2 Saturations:  Patient's O2 saturation is 89% on room air, at rest. Patient's O2 saturation is 86% on room air, with activity. Patient's O2 saturation is 93% on 2L [place], at rest.

## 2014-06-02 NOTE — Discharge Summary (Signed)
Physician Discharge Summary  Patient ID: Carlos Goodwin MRN: [id number] DOB/AGE: 1950/09/28 63 y.o. Primary Care Physician:HAWKINS,EDWARD L, MD Admit date: 05/30/2014 Discharge date: 06/02/2014    Discharge Diagnoses:  1. Healthcare associated pneumonia with acute respiratory failure, resolving. 2. COPD, will require home oxygen. 3. History of lung cancer, appears to be stable. 4. Obesity. 5. Hypertension.     Medication List         albuterol 108 (90 BASE) MCG/ACT inhaler  Commonly known as:  PROVENTIL HFA;VENTOLIN HFA  Inhale 1-2 puffs into the lungs every 6 (six) hours as needed for wheezing or shortness of breath.     aspirin EC 81 MG tablet  Take 81 mg by mouth at bedtime.     levofloxacin 500 MG tablet  Commonly known as:  LEVAQUIN  Take 1 tablet (500 mg total) by mouth daily.     metoprolol 100 MG tablet  Commonly known as:  LOPRESSOR  Take 100 mg by mouth at bedtime.     prochlorperazine 10 MG tablet  Commonly known as:  COMPAZINE  Take 1 tablet (10 mg total) by mouth every 6 (six) hours as needed for nausea or vomiting.     RADIAPLEX EX  Apply topically.     ranitidine 150 MG tablet  Commonly known as:  ZANTAC  Take 150 mg by mouth daily.     simvastatin 40 MG tablet  Commonly known as:  ZOCOR  Take 40 mg by mouth every evening.     tiotropium 18 MCG inhalation capsule  Commonly known as:  SPIRIVA  Place 18 mcg into inhaler and inhale daily.        Discharged Condition: Stable and improved.    Consults: None.  Significant Diagnostic Studies: Dg Chest 2 View  05/30/2014   CLINICAL DATA:  Chest pain radiating down the left arm. Shortness of breath. Lung cancer. coronary artery disease.  EXAM: CHEST  2 VIEW  COMPARISON:  05/21/2014  FINDINGS: Small right pleural effusion. Calcified pleural plaques bilaterally. The right upper lobe primary lung malignancy is relatively indistinct on today's exam. Fullness of the right hilum. No pneumothorax.   IMPRESSION: 1. Accentuated right hilar prominence, potentially vascular but possibly from adenopathy. If the patient has onset of symptoms raises concern for pulmonary embolus then chest CT angiogram would be suggested. 2. Small right pleural effusion. Chronic left pleural thickening with bilateral calcified pleural plaques. 3. No overt pulmonary edema.   Electronically Signed   By: Sherryl Barters M.D.   On: 05/30/2014 18:11   Ct Chest W Contrast  05/21/2014   CLINICAL DATA:  Cough, lung cancer restaging, completed chemotherapy and radiation therapy July 2015  EXAM: CT CHEST WITH CONTRAST  TECHNIQUE: Multidetector CT imaging of the chest was performed during intravenous contrast administration.  CONTRAST:  97mL OMNIPAQUE IOHEXOL 300 MG/ML  SOLN  COMPARISON:  03/09/2014  FINDINGS: Irregular right upper lobe pleural parenchymal mass is smaller, now 2.9 x 1.1 cm image 11. Subpleural 3 mm right upper lobe pulmonary nodule image 15 is stable. 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 Small pleural effusions are increased from previously with minimal associated curvilinear atelectasis. Contiguous right upper lobe bronchial wall thickening extending to the right hilum is reidentified, with persistent mild narrowing of the right upper lobe bronchus. Patchy areas of curvilinear opacity are reidentified in a subpleural location bilaterally, not significantly changed. No new suspicious mass, nodule, or consolidation is identified.  Too small to characterize 5 mm right hepatic lobe  hypodense lesion image 72 stable. Heart size is normal. Slight decrease in size of right hilar lymph nodes with conglomerate nodes measuring 1.2 cm in short axis diameter compared to 1.6 cm previously, current image 30. Small pretracheal nodes are stable. Left anterior chest wall probable sebaceous cyst reidentified image 17.  No acute osseous abnormality or lytic or sclerotic osseous lesion.  IMPRESSION:  Decrease in size of right upper lobe primary lung malignancy compatible with response to treatment.  Increased bilateral trace pleural effusions.  Other stable findings as above.   Electronically Signed   By: Conchita Paris M.D.   On: 05/21/2014 17:16   Ct Angio Chest Pe W/cm &/or Wo Cm  05/30/2014   CLINICAL DATA:  Chest pain and shortness of breath  EXAM: CT ANGIOGRAPHY CHEST WITH CONTRAST  TECHNIQUE: Multidetector CT imaging of the chest was performed using the standard protocol during bolus administration of intravenous contrast. Multiplanar CT image reconstructions and MIPs were obtained to evaluate the vascular anatomy.  CONTRAST:  121mL OMNIPAQUE IOHEXOL 350 MG/ML SOLN  COMPARISON:  05/21/2014  FINDINGS: There again noted chronic changes in the left hemi thorax with calcified pleural plaques and mild scarring. Increased density is identified however in the posterior costophrenic angle on the left best seen on image number 83 of series 6. This measures approximately 2.9 x 2.2 cm. It has increased in size from the prior exam at which time it measured 2.0 x 1.7 cm. Calcified pleural plaques are again noted on the right. The known right upper lobe mass lesion is stable in appearance when compare with the prior exam. Increasing right-sided pleural effusion is noted however.  The aorta and pulmonary artery are well visualized. No evidence of pulmonary emboli are seen. Persistent right hilar lymph node is identified. It now measures approximately 16 mm in short axis. No other significant lymphadenopathy is noted. Visualized upper abdomen is within normal limits. The vague hypodense lesion in the liver was not covered on this examination. The osseous structures show no acute abnormality.  Review of the MIP images confirms the above findings.  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  Stable appearing right upper lobe mass lesion. There is  however in enlarging right-sided pleural effusion when compared with the prior study.  Although not mentioned in the body of the report, there is increasing infiltrate in the right middle lobe with volume loss in the right middle lobe. This would account in part for the fullness of the right hilum on the plain film examination.  No evidence of pulmonary emboli.  Persistent and mildly enlarged right hilar lymph node.  Increase in the degree of infiltrated density in the posterior aspect of the left lower lobe. This is increased significantly in the past 9 days and likely represents pneumonic infiltrate.   Electronically Signed   By: Inez Catalina M.D.   On: 05/30/2014 19:11   Portable Chest 1 View  05/31/2014   CLINICAL DATA:  63 year old with healthcare associated pneumonia. History of COPD and lung cancer. Patient is undergoing chemotherapy.  EXAM: PORTABLE CHEST - 1 VIEW  COMPARISON:  05/30/2014  FINDINGS: Increasing densities in the right lower chest and right mid lung. Streaky densities at the left lung base. Heart size is grossly stable. The trachea is midline.  IMPRESSION: Increased densities in the right mid and lower chest. Findings suggest a combination of pleural fluid and atelectasis. Infection cannot be excluded.  Densities at the left lung base  are most compatible with atelectasis.   Electronically Signed   By: Markus Daft M.D.   On: 05/31/2014 08:13    Lab Results: Basic Metabolic Panel:  Recent Labs  05/31/14 0439 06/01/14 0444  NA 142 141  K 5.9* 4.9  CL 102 99  CO2 33* 34*  GLUCOSE 152* 99  BUN 22 31*  CREATININE 1.00 1.06  CALCIUM 9.0 8.8   Liver Function Tests:  Recent Labs  05/30/14 1656 05/31/14 0439  AST 23 19  ALT 29 29  ALKPHOS 79 64  BILITOT 0.3 0.2*  PROT 7.6 6.5  ALBUMIN 3.9 3.4*     CBC:  Recent Labs  05/30/14 1656 05/31/14 0439 06/01/14 0444  WBC 7.8 5.6 5.4  NEUTROABS 7.5  --  4.7  HGB 14.4 12.9* 12.3*  HCT 46.8 42.0 40.7  MCV 104.9* 105.5*  107.1*  PLT 234 214 168    Recent Results (from the past 240 hour(s))  CULTURE, BLOOD (ROUTINE X 2)     Status: None   Collection Time    05/30/14  7:36 PM      Result Value Ref Range Status   Specimen Description BLOOD RIGHT HAND   Final   Special Requests BOTTLES DRAWN AEROBIC AND ANAEROBIC 6CC   Final   Culture NO GROWTH 3 DAYS   Final   Report Status PENDING   Incomplete  CULTURE, BLOOD (ROUTINE X 2)     Status: None   Collection Time    05/30/14  7:45 PM      Result Value Ref Range Status   Specimen Description BLOOD LEFT HAND   Final   Special Requests BOTTLES DRAWN AEROBIC AND ANAEROBIC 6CC   Final   Culture NO GROWTH 3 DAYS   Final   Report Status PENDING   Incomplete  MRSA PCR SCREENING     Status: None   Collection Time    05/30/14  9:20 PM      Result Value Ref Range Status   MRSA by PCR NEGATIVE  NEGATIVE Final   Comment:            The GeneXpert MRSA Assay (FDA     approved for NASAL specimens     only), is one component of a     comprehensive MRSA colonization     surveillance program. It is not     intended to diagnose MRSA     infection nor to guide or     monitor treatment for     MRSA infections.     Hospital Course: This 63 year old man presents to the hospital with symptoms of dyspnea. Please see initial history as outlined below: HPI: Carlos Goodwin is a 63 y.o. male  Presenting to [facility] ED due to SOB and chest pain. Shortness of breath has been progressing slowly over the past week. Increased cold or allergy type symptoms recently . No home O2. Continues to smoke 1/2ppd. Denies fevers, n/v. Albuterol w/ benefit.  CP started this afternoon about 2 hours after a chemo infusion (lung cancer). L sided. Radiation down L arm. Feels like something is sitting on his chest. Improved w/ oxygen in ED.  The patient was admitted and treated with antibiotics and supplemental oxygen. Unfortunately his respiratory status deteriorated and he required to go to step down unit  where he received BiPAP per day or 2. Fortunately, he didn't improve. He's been on the floor now for at least 24 hours and he feels much improved. He  appears to be back to his baseline he does require oxygen. He does not have oxygen at home. He tells me that he will have oxygen waiting for him today. He is not febrile. He is going to be discharged home today with another seven-day course of oral antibiotics. He will follow with primary care physician. Discharge Exam: Blood pressure 103/58, pulse 81, temperature 98 F (36.7 C), temperature source Oral, resp. rate 20, height 5\' 10"  (1.778 m), weight 129.9 kg (286 lb 6 oz), SpO2 92.00%. He looks systemically well. He is obese. He is not having increased work of breathing. There is no peripheral or central cyanosis. He does have bilateral scattered wheezing which I think is probably his baseline. There is no bronchial breathing. He is alert and oriented.  Disposition: Home. He will follow with primary care physician.      Discharge Instructions   Diet - low sodium heart healthy    Complete by:  As directed      Increase activity slowly    Complete by:  As directed            Follow-up Information   Follow up with HAWKINS,EDWARD L, MD. Call in 1 week.   Specialty:  Pulmonary Disease   Contact information:   [address] [place] [address] [phone]       Signed: Doree Albee   06/02/2014, 9:09 AM

## 2014-06-02 NOTE — Progress Notes (Signed)
ANTIBIOTIC CONSULT NOTE - follow up  Pharmacy Consult for Vancomycin Indication: pneumonia  Allergies  Allergen Reactions  . Taxol [Paclitaxel] Shortness Of Breath    Decreased O2 sats.  . Plavix [Clopidogrel Bisulfate]     Hives and rash  . Other Rash    States that in 2004 he was placed on a blood thinner that made him break out in a rash.?plavix    Patient Measurements: Height: 5\' 10"  (177.8 cm) Weight: 286 lb 6 oz (129.9 kg) IBW/kg (Calculated) : 73  Vital Signs: Temp: 98 F (36.7 C) (10/24 0600) Temp Source: Oral (10/24 0600) BP: 103/58 mmHg (10/24 0600) Pulse Rate: 81 (10/24 0600) Intake/Output from previous day: 10/23 0701 - 10/24 0700 In: 730 [P.O.:480; IV Piggyback:250] Out: 300 [Urine:300] Intake/Output from this shift:    Labs:  Recent Labs  05/30/14 1656 05/31/14 0439 06/01/14 0444  WBC 7.8 5.6 5.4  HGB 14.4 12.9* 12.3*  PLT 234 214 168  CREATININE 1.03 1.00 1.06   Estimated Creatinine Clearance: 95.4 ml/min (by C-G formula based on Cr of 1.06).  Recent Labs  06/02/14 0610  VANCOTROUGH 25.1*    Microbiology: Recent Results (from the past 720 hour(s))  CULTURE, BLOOD (ROUTINE X 2)     Status: None   Collection Time    05/30/14  7:36 PM      Result Value Ref Range Status   Specimen Description BLOOD RIGHT HAND   Final   Special Requests BOTTLES DRAWN AEROBIC AND ANAEROBIC 6CC   Final   Culture NO GROWTH 3 DAYS   Final   Report Status PENDING   Incomplete  CULTURE, BLOOD (ROUTINE X 2)     Status: None   Collection Time    05/30/14  7:45 PM      Result Value Ref Range Status   Specimen Description BLOOD LEFT HAND   Final   Special Requests BOTTLES DRAWN AEROBIC AND ANAEROBIC 6CC   Final   Culture NO GROWTH 3 DAYS   Final   Report Status PENDING   Incomplete  MRSA PCR SCREENING     Status: None   Collection Time    05/30/14  9:20 PM      Result Value Ref Range Status   MRSA by PCR NEGATIVE  NEGATIVE Final   Comment:            The  GeneXpert MRSA Assay (FDA     approved for NASAL specimens     only), is one component of a     comprehensive MRSA colonization     surveillance program. It is not     intended to diagnose MRSA     infection nor to guide or     monitor treatment for     MRSA infections.   Medical History: Past Medical History  Diagnosis Date  . COPD (chronic obstructive pulmonary disease)   . Coronary artery disease   . Sleep apnea   . Hypertension   . High cholesterol   . MI (myocardial infarction) 2004  . GERD (gastroesophageal reflux disease)   . Family history of anesthesia complication     Daughter has nausea and vomiting  . Shortness of breath   . lung ca dx'd 10/2013    lungs x 3 spots   Anti-infectives   Start     Dose/Rate Route Frequency Ordered Stop   05/31/14 0600  vancomycin (VANCOCIN) IVPB 1000 mg/200 mL premix  Status:  Discontinued     1,000 mg  200 mL/hr over 60 Minutes Intravenous Every 8 hours 05/30/14 2141 06/02/14 0843   05/30/14 2200  ceFEPIme (MAXIPIME) 1 g in dextrose 5 % 50 mL IVPB     1 g 100 mL/hr over 30 Minutes Intravenous 3 times per day 05/30/14 2127 06/07/14 2159   05/30/14 2200  vancomycin (VANCOCIN) 2,000 mg in sodium chloride 0.9 % 500 mL IVPB     2,000 mg 250 mL/hr over 120 Minutes Intravenous  Once 05/30/14 2141 05/31/14 0018     Assessment: 62yo morbidly obese male admitted with worsening SOB.  Pt has good renal fxn.  Blood cultures pending.  Vancomycin trough level is above goal.  Afebrile with normal WBC.  Goal of Therapy:  Vancomycin trough level 15-20 mcg/ml  Plan:  Reduce Vancomycin to 1250mg  IV q12hrs Check trough level weekly or sooner if indicated Continue Cefepime 1gm IV q8h Deescalate antibiotics when improved / appropriate Monitor labs, renal fxn, and cultures  [place] Crane, Travus Oren A 06/02/2014,8:43 AM

## 2014-06-02 NOTE — Progress Notes (Signed)
NURSING PROGRESS NOTE  DREUX MCGROARTY [id number] Discharge Data: 06/02/2014 3:08 PM Attending Provider: No att. providers found WLN:[id number],ERDEYC Carlean Jews, MD   Elayne Guerin to be D/C'd Home per MD order.    All IV's discontinued and monitored for bleeding.  All belongings returned to patient for patient to take home.  [facility] delivered portable oxygen tank to patient. Patient educated with teach back on how to use O2 tank.  AVS summary and prescription reviewed. Patient educated on use of antibiotic.  Patient left floor via wheelchair, escorted by NT.  Last Documented Vital Signs:  Blood pressure 103/58, pulse 81, temperature 98 F (36.7 C), temperature source Oral, resp. rate 20, height 5\' 10"  (1.778 m), weight 129.9 kg (286 lb 6 oz), SpO2 83.00%.  Cecilie Kicks D

## 2014-06-02 NOTE — Plan of Care (Signed)
Problem: Consults Goal: Pneumonia Patient Education See Patient Educatio Module for education specifics.  Outcome: Completed/Met Date Met:  06/02/14 Handout explained and provided with discharge summary.  Problem: Phase I Progression Outcomes Goal: OOB as tolerated unless otherwise ordered Outcome: Completed/Met Date Met:  06/02/14 Patient steady and able to get up ad lib.  Problem: Phase III Progression Outcomes Goal: O2 sats > or equal to 93% on room air Outcome: Adequate for Discharge Home O2 established for patient.

## 2014-06-02 NOTE — Progress Notes (Signed)
CRITICAL VALUE ALERT  Critical value received:  Vancomycin Trough: 25.1  Date of notification:  06/02/2014  Time of notification:  0815  Critical value read back:Yes.    Nurse who received alert:  Cecilie Kicks, RN  MD notified (1st page):  Sinda Du, MD  Time of first page:  [phone]  MD notified (2nd page): Hart Robinsons, [facility] (Pharmacy)  Time of second page: [phone]  Responding MD:  Hart Robinsons, [facility] (Pharmacy)  Time MD responded:  [phone]

## 2014-06-04 LAB — CULTURE, BLOOD (ROUTINE X 2)
Culture: NO GROWTH
Culture: NO GROWTH

## 2014-06-05 ENCOUNTER — Telehealth: Payer: Self-pay | Admitting: Medical Oncology

## 2014-06-05 NOTE — Telephone Encounter (Signed)
Pt confirmed appts for tomorrow.

## 2014-06-06 ENCOUNTER — Ambulatory Visit: Payer: Medicare Other

## 2014-06-06 ENCOUNTER — Other Ambulatory Visit (HOSPITAL_BASED_OUTPATIENT_CLINIC_OR_DEPARTMENT_OTHER): Payer: Medicare Other

## 2014-06-06 DIAGNOSIS — C3491 Malignant neoplasm of unspecified part of right bronchus or lung: Secondary | ICD-10-CM

## 2014-06-06 DIAGNOSIS — C3411 Malignant neoplasm of upper lobe, right bronchus or lung: Secondary | ICD-10-CM

## 2014-06-06 LAB — COMPREHENSIVE METABOLIC PANEL (CC13)
ALK PHOS: 64 U/L (ref 40–150)
ALT: 77 U/L — ABNORMAL HIGH (ref 0–55)
ANION GAP: 6 meq/L (ref 3–11)
AST: 50 U/L — ABNORMAL HIGH (ref 5–34)
Albumin: 3.5 g/dL (ref 3.5–5.0)
BILIRUBIN TOTAL: 0.44 mg/dL (ref 0.20–1.20)
BUN: 24.5 mg/dL (ref 7.0–26.0)
CO2: 31 meq/L — AB (ref 22–29)
CREATININE: 0.9 mg/dL (ref 0.7–1.3)
Calcium: 10 mg/dL (ref 8.4–10.4)
Chloride: 103 mEq/L (ref 98–109)
GLUCOSE: 98 mg/dL (ref 70–140)
Potassium: 5.5 mEq/L — ABNORMAL HIGH (ref 3.5–5.1)
Sodium: 140 mEq/L (ref 136–145)
Total Protein: 6.4 g/dL (ref 6.4–8.3)

## 2014-06-06 LAB — CBC WITH DIFFERENTIAL/PLATELET
BASO%: 1.8 % (ref 0.0–2.0)
BASOS ABS: 0 10*3/uL (ref 0.0–0.1)
EOS ABS: 0 10*3/uL (ref 0.0–0.5)
EOS%: 1.8 % (ref 0.0–7.0)
HCT: 39 % (ref 38.4–49.9)
HGB: 12.7 g/dL — ABNORMAL LOW (ref 13.0–17.1)
LYMPH%: 14.4 % (ref 14.0–49.0)
MCH: 32 pg (ref 27.2–33.4)
MCHC: 32.6 g/dL (ref 32.0–36.0)
MCV: 98.2 fL — AB (ref 79.3–98.0)
MONO#: 0 10*3/uL — ABNORMAL LOW (ref 0.1–0.9)
MONO%: 0.5 % (ref 0.0–14.0)
NEUT%: 81.5 % — ABNORMAL HIGH (ref 39.0–75.0)
NEUTROS ABS: 1.8 10*3/uL (ref 1.5–6.5)
PLATELETS: 62 10*3/uL — AB (ref 140–400)
RBC: 3.97 10*6/uL — ABNORMAL LOW (ref 4.20–5.82)
RDW: 14.3 % (ref 11.0–14.6)
WBC: 2.2 10*3/uL — ABNORMAL LOW (ref 4.0–10.3)
lymph#: 0.3 10*3/uL — ABNORMAL LOW (ref 0.9–3.3)
nRBC: 0 % (ref 0–0)

## 2014-06-06 NOTE — Progress Notes (Signed)
Per Dr Julien Nordmann; hold Gemzar today due to platelets on 62. Patient instructed to return in 2 weeks as scheduled and to call with any questions or concerns. Patient and spouse verbalized understanding.

## 2014-06-06 NOTE — Patient Instructions (Signed)
Hyperkalemia Hyperkalemia is when you have too much potassium in your blood. This can be a life-threatening condition. Potassium is normally removed (excreted) from the body by the kidneys. CAUSES  The potassium level in your body can become too high for the following reasons:  You take in too much potassium. You can do this by:  Using salt substitutes. They contain large amounts of potassium.  Taking potassium supplements from your caregiver. The dose may be too high for you.  Eating foods or taking nutritional products with potassium.  You excrete too little potassium. This can happen if:  Your kidneys are not functioning properly. Kidney (renal) disease is a very common cause of hyperkalemia.  You are taking medicines that lower your excretion of potassium, such as certain diuretic medicines.  You have an adrenal gland disease called Addison's disease.  You have a urinary tract obstruction, such as kidney stones.  You are on treatment to mechanically clean your blood (dialysis) and you skip a treatment.  You release a high amount of potassium from your cells into your blood. You may have a condition that causes potassium to move from your cells to your bloodstream. This can happen with:  Injury to muscles or other tissues. Most potassium is stored in the muscles.  Severe burns or infections.  Acidic blood plasma (acidosis). Acidosis can result from many diseases, such as uncontrolled diabetes. SYMPTOMS  Usually, there are no symptoms unless the potassium is dangerously high or has risen very quickly. Symptoms may include:  Irregular or very slow heartbeat.  Feeling sick to your stomach (nauseous).  Tiredness (fatigue).  Nerve problems such as tingling of the skin, numbness of the hands or feet, weakness, or paralysis. DIAGNOSIS  A simple blood test can measure the amount of potassium in your body. An electrocardiogram test of the heart can also help make the diagnosis.  The heart may beat dangerously fast or slow down and stop beating with severe hyperkalemia.  TREATMENT  Treatment depends on how bad the condition is and on the underlying cause.  If the hyperkalemia is an emergency (causing heart problems or paralysis), many different medicines can be used alone or together to lower the potassium level briefly. This may include an insulin injection even if you are not diabetic. Emergency dialysis may be needed to remove potassium from the body.  If the hyperkalemia is less severe or dangerous, the underlying cause is treated. This can include taking medicines if needed. Your prescription medicines may be changed. You may also need to take a medicine to help your body get rid of potassium. You may need to eat a diet low in potassium. HOME CARE INSTRUCTIONS   Take medicines and supplements as directed by your caregiver.  Do not take any over-the-counter medicines, supplements, natural products, herbs, or vitamins without reviewing them with your caregiver. Certain supplements and natural food products can have high amounts of potassium. Other products (such as ibuprofen) can damage weak kidneys and raise your potassium.  You may be asked to do repeat lab tests. Be sure to follow these directions.  If you have kidney disease, you may need to follow a low potassium diet. SEEK MEDICAL CARE IF:   You notice an irregular or very slow heartbeat.  You feel lightheaded.  You develop weakness that is unusual for you. SEEK IMMEDIATE MEDICAL CARE IF:   You have shortness of breath.  You have chest discomfort.  You pass out (faint). MAKE SURE YOU:   Understand  these instructions.  Will watch your condition.  Will get help right away if you are not doing well or get worse. Document Released: 07/17/2002 Document Revised: 10/19/2011 Document Reviewed: 11/01/2013 [facility] Patient Information 2015 [place], [place]. This information is not intended to replace  advice given to you by your health care provider. Make sure you discuss any questions you have with your health care provider.  Potassium Content of Foods Potassium is a mineral found in many foods and drinks. It helps keep fluids and minerals balanced in your body and affects how steadily your heart beats. Potassium also helps control your blood pressure and keep your muscles and nervous system healthy. Certain health conditions and medicines may change the balance of potassium in your body. When this happens, you can help balance your level of potassium through the foods that you do or do not eat. Your health care provider or dietitian may recommend an amount of potassium that you should have each day. The following lists of foods provide the amount of potassium (in parentheses) per serving in each item. HIGH IN POTASSIUM  The following foods and beverages have 200 mg or more of potassium per serving:  Apricots, 2 raw or 5 dry (200 mg).  Artichoke, 1 medium (345 mg).  Avocado, raw,  each (245 mg).  Banana, 1 medium (425 mg).  Beans, lima, or baked beans, canned,  cup (280 mg).  Beans, white, canned,  cup (595 mg).  Beef roast, 3 oz (320 mg).  Beef, ground, 3 oz (270 mg).  Beets, raw or cooked,  cup (260 mg).  Bran muffin, 2 oz (300 mg).  Broccoli,  cup (230 mg).  Brussels sprouts,  cup (250 mg).  Cantaloupe,  cup (215 mg).  Cereal, 100% bran,  cup (200-400 mg).  Cheeseburger, single, fast food, 1 each (225-400 mg).  Chicken, 3 oz (220 mg).  Clams, canned, 3 oz (535 mg).  Crab, 3 oz (225 mg).  Dates, 5 each (270 mg).  Dried beans and peas,  cup (300-475 mg).  Figs, dried, 2 each (260 mg).  Fish: halibut, tuna, cod, snapper, 3 oz (480 mg).  Fish: salmon, haddock, swordfish, perch, 3 oz (300 mg).  Fish, tuna, canned 3 oz (200 mg).  [place] fries, fast food, 3 oz (470 mg).  Granola with fruit and nuts,  cup (200 mg).  Grapefruit juice,  cup (200  mg).  Greens, beet,  cup (655 mg).  Honeydew melon,  cup (200 mg).  Kale, raw, 1 cup (300 mg).  Kiwi, 1 medium (240 mg).  Kohlrabi, rutabaga, parsnips,  cup (280 mg).  Lentils,  cup (365 mg).  Mango, 1 each (325 mg).  Milk, chocolate, 1 cup (420 mg).  Milk: nonfat, low-fat, whole, buttermilk, 1 cup (350-380 mg).  Molasses, 1 Tbsp (295 mg).  Mushrooms,  cup (280) mg.  Nectarine, 1 each (275 mg).  Nuts: almonds, peanuts, hazelnuts, [place], cashew, mixed, 1 oz (200 mg).  Nuts, pistachios, 1 oz (295 mg).  Orange, 1 each (240 mg).  Orange juice,  cup (235 mg).  Papaya, medium,  fruit (390 mg).  Peanut butter, chunky, 2 Tbsp (240 mg).  Peanut butter, smooth, 2 Tbsp (210 mg).  Pear, 1 medium (200 mg).  Pomegranate, 1 whole (400 mg).  Pomegranate juice,  cup (215 mg).  Pork, 3 oz (350 mg).  Potato chips, salted, 1 oz (465 mg).  Potato, baked with skin, 1 medium (925 mg).  Potatoes, boiled,  cup (255 mg).  Potatoes, mashed,  cup (330 mg).  Prune juice,  cup (370 mg).  Prunes, 5 each (305 mg).  Pudding, chocolate,  cup (230 mg).  Pumpkin, canned,  cup (250 mg).  Raisins, seedless,  cup (270 mg).  Seeds, sunflower or pumpkin, 1 oz (240 mg).  Soy milk, 1 cup (300 mg).  Spinach,  cup (420 mg).  Spinach, canned,  cup (370 mg).  Sweet potato, baked with skin, 1 medium (450 mg).  Swiss chard,  cup (480 mg).  Tomato or vegetable juice,  cup (275 mg).  Tomato sauce or puree,  cup (400-550 mg).  Tomato, raw, 1 medium (290 mg).  Tomatoes, canned,  cup (200-300 mg).  [place], 3 oz (250 mg).  Wheat germ, 1 oz (250 mg).  Winter squash,  cup (250 mg).  Yogurt, plain or fruited, 6 oz (260-435 mg).  Zucchini,  cup (220 mg). MODERATE IN POTASSIUM The following foods and beverages have 50-200 mg of potassium per serving:  Apple, 1 each (150 mg).  Apple juice,  cup (150 mg).  Applesauce,  cup (90 mg).  Apricot nectar,   cup (140 mg).  Asparagus, small spears,  cup or 6 spears (155 mg).  Bagel, cinnamon raisin, 1 each (130 mg).  Bagel, egg or plain, 4 in., 1 each (70 mg).  Beans, green,  cup (90 mg).  Beans, yellow,  cup (190 mg).  Beer, regular, 12 oz (100 mg).  Beets, canned,  cup (125 mg).  Blackberries,  cup (115 mg).  Blueberries,  cup (60 mg).  Bread, whole wheat, 1 slice (70 mg).  Broccoli, raw,  cup (145 mg).  Cabbage,  cup (150 mg).  Carrots, cooked or raw,  cup (180 mg).  Cauliflower, raw,  cup (150 mg).  Celery, raw,  cup (155 mg).  Cereal, bran flakes, cup (120-150 mg).  Cheese, cottage,  cup (110 mg).  Cherries, 10 each (150 mg).  Chocolate, 1 oz bar (165 mg).  Coffee, brewed 6 oz (90 mg).  Corn,  cup or 1 ear (195 mg).  Cucumbers,  cup (80 mg).  Egg, large, 1 each (60 mg).  Eggplant,  cup (60 mg).  Endive, raw, cup (80 mg).  English muffin, 1 each (65 mg).  Fish, orange roughy, 3 oz (150 mg).  Frankfurter, beef or pork, 1 each (75 mg).  Fruit cocktail,  cup (115 mg).  Grape juice,  cup (170 mg).  Grapefruit,  fruit (175 mg).  Grapes,  cup (155 mg).  Greens: kale, turnip, collard,  cup (110-150 mg).  Ice cream or frozen yogurt, chocolate,  cup (175 mg).  Ice cream or frozen yogurt, vanilla,  cup (120-150 mg).  Lemons, limes, 1 each (80 mg).  Lettuce, all types, 1 cup (100 mg).  Mixed vegetables,  cup (150 mg).  Mushrooms, raw,  cup (110 mg).  Nuts: walnuts, pecans, or macadamia, 1 oz (125 mg).  Oatmeal,  cup (80 mg).  Okra,  cup (110 mg).  Onions, raw,  cup (120 mg).  Peach, 1 each (185 mg).  Peaches, canned,  cup (120 mg).  Pears, canned,  cup (120 mg).  Peas, green, frozen,  cup (90 mg).  Peppers, green,  cup (130 mg).  Peppers, red,  cup (160 mg).  Pineapple juice,  cup (165 mg).  Pineapple, fresh or canned,  cup (100 mg).  Plums, 1 each (105 mg).  Pudding, vanilla,  cup (150  mg).  Raspberries,  cup (90 mg).  Rhubarb,  cup (115 mg).  Rice, wild,  cup (80 mg).  Shrimp, 3 oz (155 mg).  Spinach, raw, 1 cup (170 mg).  Strawberries,  cup (125 mg).  Summer squash  cup (175-200 mg).  Swiss chard, raw, 1 cup (135 mg).  Tangerines, 1 each (140 mg).  Tea, brewed, 6 oz (65 mg).  Turnips,  cup (140 mg).  Watermelon,  cup (85 mg).  Wine, red, table, 5 oz (180 mg).  Wine, white, table, 5 oz (100 mg). LOW IN POTASSIUM The following foods and beverages have less than 50 mg of potassium per serving.  Bread, white, 1 slice (30 mg).  Carbonated beverages, 12 oz (less than 5 mg).  Cheese, 1 oz (20-30 mg).  Cranberries,  cup (45 mg).  Cranberry juice cocktail,  cup (20 mg).  Fats and oils, 1 Tbsp (less than 5 mg).  Hummus, 1 Tbsp (32 mg).  Nectar: papaya, mango, or pear,  cup (35 mg).  Rice, white or brown,  cup (50 mg).  Spaghetti or macaroni,  cup cooked (30 mg).  Tortilla, flour or corn, 1 each (50 mg).  Waffle, 4 in., 1 each (50 mg).  Water chestnuts,  cup (40 mg). Document Released: 03/10/2005 Document Revised: 08/01/2013 Document Reviewed: 06/23/2013 [facility] Patient Information 2015 [place], [place]. This information is not intended to replace advice given to you by your health care provider. Make sure you discuss any questions you have with your health care provider.

## 2014-06-14 DIAGNOSIS — J189 Pneumonia, unspecified organism: Secondary | ICD-10-CM

## 2014-06-14 HISTORY — DX: Pneumonia, unspecified organism: J18.9

## 2014-06-20 ENCOUNTER — Telehealth: Payer: Self-pay | Admitting: Internal Medicine

## 2014-06-20 ENCOUNTER — Ambulatory Visit: Payer: Medicare Other

## 2014-06-20 ENCOUNTER — Encounter: Payer: Self-pay | Admitting: Internal Medicine

## 2014-06-20 ENCOUNTER — Ambulatory Visit (HOSPITAL_BASED_OUTPATIENT_CLINIC_OR_DEPARTMENT_OTHER): Payer: Medicare Other | Admitting: Internal Medicine

## 2014-06-20 ENCOUNTER — Other Ambulatory Visit (HOSPITAL_BASED_OUTPATIENT_CLINIC_OR_DEPARTMENT_OTHER): Payer: Medicare Other

## 2014-06-20 VITALS — BP 125/65 | HR 83 | Temp 98.3°F | Resp 21 | Ht 70.0 in | Wt 280.8 lb

## 2014-06-20 DIAGNOSIS — D61810 Antineoplastic chemotherapy induced pancytopenia: Secondary | ICD-10-CM

## 2014-06-20 DIAGNOSIS — T451X5A Adverse effect of antineoplastic and immunosuppressive drugs, initial encounter: Principal | ICD-10-CM | POA: Insufficient documentation

## 2014-06-20 DIAGNOSIS — D701 Agranulocytosis secondary to cancer chemotherapy: Secondary | ICD-10-CM

## 2014-06-20 DIAGNOSIS — C3411 Malignant neoplasm of upper lobe, right bronchus or lung: Secondary | ICD-10-CM

## 2014-06-20 DIAGNOSIS — C3491 Malignant neoplasm of unspecified part of right bronchus or lung: Secondary | ICD-10-CM

## 2014-06-20 LAB — CBC WITH DIFFERENTIAL/PLATELET
BASO%: 0.9 % (ref 0.0–2.0)
BASOS ABS: 0 10*3/uL (ref 0.0–0.1)
EOS%: 2.6 % (ref 0.0–7.0)
Eosinophils Absolute: 0 10*3/uL (ref 0.0–0.5)
HEMATOCRIT: 37.1 % — AB (ref 38.4–49.9)
HGB: 12.1 g/dL — ABNORMAL LOW (ref 13.0–17.1)
LYMPH#: 0.4 10*3/uL — AB (ref 0.9–3.3)
LYMPH%: 35.1 % (ref 14.0–49.0)
MCH: 31.8 pg (ref 27.2–33.4)
MCHC: 32.6 g/dL (ref 32.0–36.0)
MCV: 97.6 fL (ref 79.3–98.0)
MONO#: 0.3 10*3/uL (ref 0.1–0.9)
MONO%: 29.8 % — ABNORMAL HIGH (ref 0.0–14.0)
NEUT#: 0.4 10*3/uL — CL (ref 1.5–6.5)
NEUT%: 31.6 % — ABNORMAL LOW (ref 39.0–75.0)
Platelets: 67 10*3/uL — ABNORMAL LOW (ref 140–400)
RBC: 3.8 10*6/uL — ABNORMAL LOW (ref 4.20–5.82)
RDW: 14.4 % (ref 11.0–14.6)
WBC: 1.1 10*3/uL — ABNORMAL LOW (ref 4.0–10.3)

## 2014-06-20 LAB — COMPREHENSIVE METABOLIC PANEL (CC13)
ALK PHOS: 63 U/L (ref 40–150)
ALT: 21 U/L (ref 0–55)
AST: 14 U/L (ref 5–34)
Albumin: 3.6 g/dL (ref 3.5–5.0)
Anion Gap: 6 mEq/L (ref 3–11)
BILIRUBIN TOTAL: 0.3 mg/dL (ref 0.20–1.20)
BUN: 13.5 mg/dL (ref 7.0–26.0)
CALCIUM: 9.5 mg/dL (ref 8.4–10.4)
CO2: 30 mEq/L — ABNORMAL HIGH (ref 22–29)
Chloride: 104 mEq/L (ref 98–109)
Creatinine: 0.9 mg/dL (ref 0.7–1.3)
Glucose: 104 mg/dl (ref 70–140)
Potassium: 4.5 mEq/L (ref 3.5–5.1)
Sodium: 141 mEq/L (ref 136–145)
Total Protein: 6.5 g/dL (ref 6.4–8.3)

## 2014-06-20 MED ORDER — PEGFILGRASTIM INJECTION 6 MG/0.6ML
6.0000 mg | Freq: Once | SUBCUTANEOUS | Status: AC
  Administered 2014-06-20: 6 mg via SUBCUTANEOUS
  Filled 2014-06-20: qty 0.6

## 2014-06-20 NOTE — Telephone Encounter (Signed)
Gave avs & cal for Dec. °

## 2014-06-20 NOTE — Progress Notes (Signed)
[facility] [phone]   [phone]  OFFICE PROGRESS NOTE  Alonza Bogus, MD [place] [address]   DIAGNOSIS: Stage IIIA (T2a, N2, M0) non-small cell lung cancer consistent with adenocarcinoma diagnosed in May of 2015, presented with right upper lobe lung mass in addition to mediastinal lymphadenopathy.  PRIOR THERAPY:   1) Concurrent chemoradiation with weekly carboplatin for AUC of 2 and paclitaxel 45 mg/M2, status post 7 cycle, with partial response. Paclitaxel was discontinued at cycle #3 secondary to hypersensitivity reaction.  2) Systemic chemotherapy with carboplatin for AUC of 5 on day 1 and gemcitabine 1000 mg/M2 on days 1 and 8 every 3 weeks. Status post 3 cycles. First dose 03/28/2014. Discontinue today secondary to intolerance with significant pancytopenia.   CURRENT THERAPY: Observation.  INTERVAL HISTORY: Carlos Goodwin 63 y.o. male returns to the clinic today for followup visit accompanied by his wife. He is feeling fine today except for the shortness of breath. He is currently on home oxygen. He was admitted to [facility] after the last cycle of his chemotherapy was pneumonia. He is feeling much better today. He denied having any significant chest pain, or hemoptysis. He has cough productive of whitish sputum. He denied having any significant fever or chills, no nausea or vomiting. He has no significant weight loss or night sweats. He has several delays and missed treatment secondary to pancytopenia. He is here today for evaluation and discussion of his treatment options.   MEDICAL HISTORY: Past Medical History  Diagnosis Date  . COPD (chronic obstructive pulmonary disease)   . Coronary artery disease   . Sleep apnea   . Hypertension   . High cholesterol   . MI (myocardial infarction) 2004  . GERD (gastroesophageal reflux disease)   . Family history of anesthesia complication    Daughter has nausea and vomiting  . Shortness of breath   . lung ca dx'd 10/2013    lungs x 3 spots    ALLERGIES:  is allergic to taxol; plavix; and other.  MEDICATIONS:  Current Outpatient Prescriptions  Medication Sig Dispense Refill  . albuterol (PROVENTIL HFA;VENTOLIN HFA) 108 (90 BASE) MCG/ACT inhaler Inhale 1-2 puffs into the lungs every 6 (six) hours as needed for wheezing or shortness of breath.    Marland Kitchen aspirin EC 81 MG tablet Take 81 mg by mouth at bedtime.    . metoprolol (LOPRESSOR) 100 MG tablet Take 100 mg by mouth at bedtime.    . ranitidine (ZANTAC) 150 MG tablet Take 150 mg by mouth daily.    . simvastatin (ZOCOR) 40 MG tablet Take 40 mg by mouth every evening.    . tiotropium (SPIRIVA) 18 MCG inhalation capsule Place 18 mcg into inhaler and inhale daily.    . prochlorperazine (COMPAZINE) 10 MG tablet Take 1 tablet (10 mg total) by mouth every 6 (six) hours as needed for nausea or vomiting. 60 tablet 0  . Wound Cleansers (RADIAPLEX EX) Apply topically.     No current facility-administered medications for this visit.    SURGICAL HISTORY:  Past Surgical History  Procedure Laterality Date  . Appendectomy    . Coronary angioplasty  2004    1 stent  . Anterior cruciate ligament repair Right   . Tonsillectomy    . Prostate biopsy N/A 06/13/2013    Procedure: ULTRASOUND GUIDED PROSTATE BIOPSY;  Surgeon: Marissa Nestle, MD;  Location: [facility];  Service: Urology;  Laterality: N/A;  . Video bronchoscopy with endobronchial navigation N/A 12/12/2013    Procedure: VIDEO BRONCHOSCOPY WITH ENDOBRONCHIAL NAVIGATION;  Surgeon: Melrose Nakayama, MD;  Location: [facility];  Service: Thoracic;  Laterality: N/A;  . Video bronchoscopy with endobronchial ultrasound N/A 12/12/2013    Procedure: VIDEO BRONCHOSCOPY WITH ENDOBRONCHIAL ULTRASOUND;  Surgeon: Melrose Nakayama, MD;  Location: [facility];  Service: Thoracic;  Laterality: N/A;    REVIEW OF SYSTEMS:  Constitutional: positive for  fatigue Eyes: negative Ears, nose, mouth, throat, and face: negative Respiratory: positive for cough and dyspnea on exertion Cardiovascular: negative Gastrointestinal: negative Genitourinary:negative Integument/breast: negative Hematologic/lymphatic: negative Musculoskeletal:negative Neurological: negative Behavioral/Psych: negative Endocrine: negative Allergic/Immunologic: negative   PHYSICAL EXAMINATION: General appearance: alert, cooperative and no distress Head: Normocephalic, without obvious abnormality, atraumatic Neck: no adenopathy, no JVD, supple, symmetrical, trachea midline and thyroid not enlarged, symmetric, no tenderness/mass/nodules Lymph nodes: Cervical, supraclavicular, and axillary nodes normal. Resp: clear to auscultation bilaterally Back: symmetric, no curvature. ROM normal. No CVA tenderness. Cardio: regular rate and rhythm, S1, S2 normal, no murmur, click, rub or gallop GI: soft, non-tender; bowel sounds normal; no masses,  no organomegaly Extremities: extremities normal, atraumatic, no cyanosis or edema Neurologic: Alert and oriented X 3, normal strength and tone. Normal symmetric reflexes. Normal coordination and gait  ECOG PERFORMANCE STATUS: 1 - Symptomatic but completely ambulatory  Blood pressure 125/65, pulse 83, temperature 98.3 F (36.8 C), temperature source Oral, resp. rate 21, height 5\' 10"  (1.778 m), weight 280 lb 12.8 oz (127.37 kg), SpO2 94 %.  LABORATORY DATA: Lab Results  Component Value Date   WBC 1.1* 06/20/2014   HGB 12.1* 06/20/2014   HCT 37.1* 06/20/2014   MCV 97.6 06/20/2014   PLT 67* 06/20/2014      Chemistry      Component Value Date/Time   NA 141 06/20/2014 0850   NA 141 06/01/2014 0444   K 4.5 06/20/2014 0850   K 4.9 06/01/2014 0444   CL 99 06/01/2014 0444   CO2 30* 06/20/2014 0850   CO2 34* 06/01/2014 0444   BUN 13.5 06/20/2014 0850   BUN 31* 06/01/2014 0444   CREATININE 0.9 06/20/2014 0850   CREATININE 1.06  06/01/2014 0444      Component Value Date/Time   CALCIUM 9.5 06/20/2014 0850   CALCIUM 8.8 06/01/2014 0444   ALKPHOS 63 06/20/2014 0850   ALKPHOS 64 05/31/2014 0439   AST 14 06/20/2014 0850   AST 19 05/31/2014 0439   ALT 21 06/20/2014 0850   ALT 29 05/31/2014 0439   BILITOT 0.30 06/20/2014 0850   BILITOT 0.2* 05/31/2014 0439       RADIOGRAPHIC STUDIES:  ASSESSMENT AND PLAN: This is a very pleasant 63 years old white male recently diagnosed with a stage IIIA non-small cell lung cancer, completed a course of concurrent chemoradiation with weekly carboplatin and paclitaxel is status post 7 cycle. He tolerated the first cycle of his treatment fairly well. Paclitaxel was discontinued secondary to hypersensitivity reaction at cycle #3. He was started recently on systemic chemotherapy with carboplatin and gemcitabine but unfortunately had significant pancytopenia and he has several delays as well as missed treatment because of the pancytopenia. His CT angiogram after cycle #3 showed improvement in his disease but unfortunately the patient is unable to tolerate this treatment with the persistent pancytopenia and missed treatment. I will arrange for the patient to receive Neulasta today for the persistent chemotherapy-induced neutropenia. I will discontinue the current treatment with carboplatin and gemcitabine. I will  see the patient back for follow-up visit after Thanksgiving for reevaluation and discussion of other treatment options, including but not limited to Alimta, immunotherapy or Tarceva. The patient was advised to call immediately if he has any concerning symptoms in the interval. The patient voices understanding of current disease status and treatment options and is in agreement with the current care plan.  All questions were answered. The patient knows to call the clinic with any problems, questions or concerns. We can certainly see the patient much sooner if necessary.  Disclaimer:  This note was dictated with voice recognition software. Similar sounding words can inadvertently be transcribed and may not be corrected upon review.

## 2014-06-20 NOTE — Patient Instructions (Signed)
Smoking Cessation, Tips for Success  If you are ready to quit smoking, congratulations! You have chosen to help yourself be healthier. Cigarettes bring nicotine, tar, carbon monoxide, and other irritants into your body. Your lungs, heart, and blood vessels will be able to work better without these poisons. There are many different ways to quit smoking. Nicotine gum, nicotine patches, a nicotine inhaler, or nicotine nasal spray can help with physical craving. Hypnosis, support groups, and medicines help break the habit of smoking.  WHAT THINGS CAN I DO TO MAKE QUITTING EASIER?   Here are some tips to help you quit for good:  · Pick a date when you will quit smoking completely. Tell all of your friends and family about your plan to quit on that date.  · Do not try to slowly cut down on the number of cigarettes you are smoking. Pick a quit date and quit smoking completely starting on that day.  · Throw away all cigarettes.    · Clean and remove all ashtrays from your home, work, and car.  · On a card, write down your reasons for quitting. Carry the card with you and read it when you get the urge to smoke.  · Cleanse your body of nicotine. Drink enough water and fluids to keep your urine clear or pale yellow. Do this after quitting to flush the nicotine from your body.  · Learn to predict your moods. Do not let a bad situation be your excuse to have a cigarette. Some situations in your life might tempt you into wanting a cigarette.  · Never have "just one" cigarette. It leads to wanting another and another. Remind yourself of your decision to quit.  · Change habits associated with smoking. If you smoked while driving or when feeling stressed, try other activities to replace smoking. Stand up when drinking your coffee. Brush your teeth after eating. Sit in a different chair when you read the paper. Avoid alcohol while trying to quit, and try to drink fewer caffeinated beverages. Alcohol and caffeine may urge you to  smoke.  · Avoid foods and drinks that can trigger a desire to smoke, such as sugary or spicy foods and alcohol.  · Ask people who smoke not to smoke around you.  · Have something planned to do right after eating or having a cup of coffee. For example, plan to take a walk or exercise.  · Try a relaxation exercise to calm you down and decrease your stress. Remember, you may be tense and nervous for the first 2 weeks after you quit, but this will pass.  · Find new activities to keep your hands busy. Play with a pen, coin, or rubber band. Doodle or draw things on paper.  · Brush your teeth right after eating. This will help cut down on the craving for the taste of tobacco after meals. You can also try mouthwash.    · Use oral substitutes in place of cigarettes. Try using lemon drops, carrots, cinnamon sticks, or chewing gum. Keep them handy so they are available when you have the urge to smoke.  · When you have the urge to smoke, try deep breathing.  · Designate your home as a nonsmoking area.  · If you are a heavy smoker, ask your health care provider about a prescription for nicotine chewing gum. It can ease your withdrawal from nicotine.  · Reward yourself. Set aside the cigarette money you save and buy yourself something nice.  · Look for   support from others. Join a support group or smoking cessation program. Ask someone at home or at work to help you with your plan to quit smoking.  · Always ask yourself, "Do I need this cigarette or is this just a reflex?" Tell yourself, "Today, I choose not to smoke," or "I do not want to smoke." You are reminding yourself of your decision to quit.  · Do not replace cigarette smoking with electronic cigarettes (commonly called e-cigarettes). The safety of e-cigarettes is unknown, and some may contain harmful chemicals.  · If you relapse, do not give up! Plan ahead and think about what you will do the next time you get the urge to smoke.  HOW WILL I FEEL WHEN I QUIT SMOKING?  You  may have symptoms of withdrawal because your body is used to nicotine (the addictive substance in cigarettes). You may crave cigarettes, be irritable, feel very hungry, cough often, get headaches, or have difficulty concentrating. The withdrawal symptoms are only temporary. They are strongest when you first quit but will go away within 10-14 days. When withdrawal symptoms occur, stay in control. Think about your reasons for quitting. Remind yourself that these are signs that your body is healing and getting used to being without cigarettes. Remember that withdrawal symptoms are easier to treat than the major diseases that smoking can cause.   Even after the withdrawal is over, expect periodic urges to smoke. However, these cravings are generally short lived and will go away whether you smoke or not. Do not smoke!  WHAT RESOURCES ARE AVAILABLE TO HELP ME QUIT SMOKING?  Your health care provider can direct you to community resources or hospitals for support, which may include:  · Group support.  · Education.  · Hypnosis.  · Therapy.  Document Released: 04/24/2004 Document Revised: 12/11/2013 Document Reviewed: 01/12/2013  [employer]® Patient Information ©2015 [employer]. This information is not intended to replace advice given to you by your health care provider. Make sure you discuss any questions you have with your health care provider.

## 2014-06-27 ENCOUNTER — Inpatient Hospital Stay (HOSPITAL_COMMUNITY)
Admission: EM | Admit: 2014-06-27 | Discharge: 2014-06-30 | DRG: 190 | Disposition: A | Discharge status: Home / Self Care | Payer: Medicare Other | Attending: Pulmonary Disease | Admitting: Pulmonary Disease

## 2014-06-27 ENCOUNTER — Encounter (HOSPITAL_COMMUNITY): Payer: Self-pay

## 2014-06-27 ENCOUNTER — Emergency Department (HOSPITAL_COMMUNITY): Payer: Medicare Other

## 2014-06-27 DIAGNOSIS — Z923 Personal history of irradiation: Secondary | ICD-10-CM | POA: Diagnosis not present

## 2014-06-27 DIAGNOSIS — Z7952 Long term (current) use of systemic steroids: Secondary | ICD-10-CM

## 2014-06-27 DIAGNOSIS — D72829 Elevated white blood cell count, unspecified: Secondary | ICD-10-CM

## 2014-06-27 DIAGNOSIS — F17210 Nicotine dependence, cigarettes, uncomplicated: Secondary | ICD-10-CM | POA: Diagnosis present

## 2014-06-27 DIAGNOSIS — R062 Wheezing: Secondary | ICD-10-CM | POA: Diagnosis not present

## 2014-06-27 DIAGNOSIS — G4730 Sleep apnea, unspecified: Secondary | ICD-10-CM | POA: Diagnosis present

## 2014-06-27 DIAGNOSIS — Z8701 Personal history of pneumonia (recurrent): Secondary | ICD-10-CM | POA: Diagnosis not present

## 2014-06-27 DIAGNOSIS — I2510 Atherosclerotic heart disease of native coronary artery without angina pectoris: Secondary | ICD-10-CM | POA: Diagnosis present

## 2014-06-27 DIAGNOSIS — Z7982 Long term (current) use of aspirin: Secondary | ICD-10-CM | POA: Diagnosis not present

## 2014-06-27 DIAGNOSIS — Z9981 Dependence on supplemental oxygen: Secondary | ICD-10-CM | POA: Diagnosis not present

## 2014-06-27 DIAGNOSIS — Y95 Nosocomial condition: Secondary | ICD-10-CM | POA: Diagnosis present

## 2014-06-27 DIAGNOSIS — J441 Chronic obstructive pulmonary disease with (acute) exacerbation: Secondary | ICD-10-CM | POA: Diagnosis present

## 2014-06-27 DIAGNOSIS — I252 Old myocardial infarction: Secondary | ICD-10-CM | POA: Diagnosis not present

## 2014-06-27 DIAGNOSIS — E7800 Pure hypercholesterolemia, unspecified: Secondary | ICD-10-CM | POA: Diagnosis present

## 2014-06-27 DIAGNOSIS — Z6841 Body Mass Index (BMI) 40.0 and over, adult: Secondary | ICD-10-CM | POA: Diagnosis not present

## 2014-06-27 DIAGNOSIS — J189 Pneumonia, unspecified organism: Secondary | ICD-10-CM | POA: Diagnosis present

## 2014-06-27 DIAGNOSIS — Z8042 Family history of malignant neoplasm of prostate: Secondary | ICD-10-CM

## 2014-06-27 DIAGNOSIS — I10 Essential (primary) hypertension: Secondary | ICD-10-CM | POA: Diagnosis present

## 2014-06-27 DIAGNOSIS — C3491 Malignant neoplasm of unspecified part of right bronchus or lung: Secondary | ICD-10-CM | POA: Diagnosis present

## 2014-06-27 DIAGNOSIS — Z9221 Personal history of antineoplastic chemotherapy: Secondary | ICD-10-CM

## 2014-06-27 DIAGNOSIS — E785 Hyperlipidemia, unspecified: Secondary | ICD-10-CM | POA: Diagnosis present

## 2014-06-27 DIAGNOSIS — Z801 Family history of malignant neoplasm of trachea, bronchus and lung: Secondary | ICD-10-CM

## 2014-06-27 DIAGNOSIS — K219 Gastro-esophageal reflux disease without esophagitis: Secondary | ICD-10-CM | POA: Diagnosis present

## 2014-06-27 DIAGNOSIS — Z833 Family history of diabetes mellitus: Secondary | ICD-10-CM | POA: Diagnosis not present

## 2014-06-27 DIAGNOSIS — Z792 Long term (current) use of antibiotics: Secondary | ICD-10-CM | POA: Diagnosis not present

## 2014-06-27 DIAGNOSIS — Z720 Tobacco use: Secondary | ICD-10-CM

## 2014-06-27 HISTORY — DX: Pneumonia, unspecified organism: J18.9

## 2014-06-27 LAB — I-STAT CG4 LACTIC ACID, ED: Lactic Acid, Venous: 1.45 mmol/L (ref 0.5–2.2)

## 2014-06-27 LAB — COMPREHENSIVE METABOLIC PANEL
ALBUMIN: 3.8 g/dL (ref 3.5–5.2)
ALT: 24 U/L (ref 0–53)
ANION GAP: 12 (ref 5–15)
AST: 18 U/L (ref 0–37)
Alkaline Phosphatase: 199 U/L — ABNORMAL HIGH (ref 39–117)
BUN: 22 mg/dL (ref 6–23)
CALCIUM: 9.8 mg/dL (ref 8.4–10.5)
CHLORIDE: 96 meq/L (ref 96–112)
CO2: 34 mEq/L — ABNORMAL HIGH (ref 19–32)
CREATININE: 0.98 mg/dL (ref 0.50–1.35)
GFR calc Af Amer: >90 mL/min (ref >90)
GFR calc non Af Amer: 85 mL/min — ABNORMAL LOW (ref >90)
Glucose, Bld: 138 mg/dL — ABNORMAL HIGH (ref 70–99)
Potassium: 4.9 mEq/L (ref 3.7–5.3)
Sodium: 142 mEq/L (ref 137–147)
TOTAL PROTEIN: 7.1 g/dL (ref 6.0–8.3)
Total Bilirubin: 0.2 mg/dL — ABNORMAL LOW (ref 0.3–1.2)

## 2014-06-27 LAB — PRO B NATRIURETIC PEPTIDE: PRO B NATRI PEPTIDE: 1181 pg/mL — AB (ref 0–125)

## 2014-06-27 LAB — BLOOD GAS, ARTERIAL
Acid-Base Excess: 10 mmol/L — ABNORMAL HIGH (ref 0.0–2.0)
Bicarbonate: 35.4 mEq/L — ABNORMAL HIGH (ref 20.0–24.0)
DRAWN BY: 234301
O2 CONTENT: 4 L/min
O2 Saturation: 90.3 %
PCO2 ART: 62.6 mmHg — AB (ref 35.0–45.0)
PO2 ART: 62.4 mmHg — AB (ref 80.0–100.0)
Patient temperature: 37
TCO2: 32.4 mmol/L (ref 0–100)
pH, Arterial: 7.371 (ref 7.350–7.450)

## 2014-06-27 LAB — CBC WITH DIFFERENTIAL/PLATELET
BASOS ABS: 0 10*3/uL (ref 0.0–0.1)
BASOS PCT: 0 % (ref 0–1)
Eosinophils Absolute: 0 10*3/uL (ref 0.0–0.7)
Eosinophils Relative: 0 % (ref 0–5)
HCT: 38.3 % — ABNORMAL LOW (ref 39.0–52.0)
HEMOGLOBIN: 12.3 g/dL — AB (ref 13.0–17.0)
LYMPHS PCT: 1 % — AB (ref 12–46)
Lymphs Abs: 0.3 10*3/uL — ABNORMAL LOW (ref 0.7–4.0)
MCH: 32.5 pg (ref 26.0–34.0)
MCHC: 32.1 g/dL (ref 30.0–36.0)
MCV: 101.3 fL — ABNORMAL HIGH (ref 78.0–100.0)
Monocytes Absolute: 1.6 10*3/uL — ABNORMAL HIGH (ref 0.1–1.0)
Monocytes Relative: 6 % (ref 3–12)
Neutro Abs: 25.2 10*3/uL — ABNORMAL HIGH (ref 1.7–7.7)
Neutrophils Relative %: 93 % — ABNORMAL HIGH (ref 43–77)
Platelets: 105 10*3/uL — ABNORMAL LOW (ref 150–400)
RBC: 3.78 MIL/uL — ABNORMAL LOW (ref 4.22–5.81)
RDW: 15.3 % (ref 11.5–15.5)
WBC: 27.1 10*3/uL — ABNORMAL HIGH (ref 4.0–10.5)

## 2014-06-27 LAB — TROPONIN I: Troponin I: <0.3 ng/mL (ref <0.30)

## 2014-06-27 MED ORDER — ASPIRIN EC 81 MG PO TBEC
81.0000 mg | DELAYED_RELEASE_TABLET | Freq: Every day | ORAL | Status: DC
  Administered 2014-06-27 – 2014-06-29 (×3): 81 mg via ORAL
  Filled 2014-06-27 (×3): qty 1

## 2014-06-27 MED ORDER — METHYLPREDNISOLONE SODIUM SUCC 125 MG IJ SOLR
125.0000 mg | Freq: Once | INTRAMUSCULAR | Status: AC
  Administered 2014-06-27: 125 mg via INTRAVENOUS
  Filled 2014-06-27: qty 2

## 2014-06-27 MED ORDER — SODIUM CHLORIDE 0.9 % IJ SOLN: 3.0000 mL | INTRAMUSCULAR | Status: DC | PRN

## 2014-06-27 MED ORDER — METHYLPREDNISOLONE SODIUM SUCC 125 MG IJ SOLR
60.0000 mg | Freq: Four times a day (QID) | INTRAMUSCULAR | Status: DC
  Administered 2014-06-27 – 2014-06-29 (×6): 60 mg via INTRAVENOUS
  Filled 2014-06-27 (×6): qty 2

## 2014-06-27 MED ORDER — NICOTINE 14 MG/24HR TD PT24
14.0000 mg | MEDICATED_PATCH | Freq: Every day | TRANSDERMAL | Status: DC
  Administered 2014-06-28 – 2014-06-30 (×3): 14 mg via TRANSDERMAL
  Filled 2014-06-27 (×3): qty 1

## 2014-06-27 MED ORDER — SODIUM CHLORIDE 0.9 % IV SOLN
1250.0000 mg | Freq: Two times a day (BID) | INTRAVENOUS | Status: DC
  Administered 2014-06-28 – 2014-06-29 (×3): 1250 mg via INTRAVENOUS
  Filled 2014-06-27 (×10): qty 1250

## 2014-06-27 MED ORDER — IPRATROPIUM-ALBUTEROL 0.5-2.5 (3) MG/3ML IN SOLN
3.0000 mL | Freq: Once | RESPIRATORY_TRACT | Status: AC
  Administered 2014-06-27: 3 mL via RESPIRATORY_TRACT
  Filled 2014-06-27: qty 3

## 2014-06-27 MED ORDER — SIMVASTATIN 20 MG PO TABS
40.0000 mg | ORAL_TABLET | Freq: Every evening | ORAL | Status: DC
  Administered 2014-06-27 – 2014-06-29 (×3): 40 mg via ORAL
  Filled 2014-06-27 (×4): qty 2

## 2014-06-27 MED ORDER — HEPARIN SODIUM (PORCINE) 5000 UNIT/ML IJ SOLN
5000.0000 [IU] | Freq: Three times a day (TID) | INTRAMUSCULAR | Status: DC
  Administered 2014-06-27 – 2014-06-30 (×8): 5000 [IU] via SUBCUTANEOUS
  Filled 2014-06-27 (×8): qty 1

## 2014-06-27 MED ORDER — ALBUTEROL (5 MG/ML) CONTINUOUS INHALATION SOLN
15.0000 mg/h | INHALATION_SOLUTION | RESPIRATORY_TRACT | Status: DC
  Administered 2014-06-27: 15 mg/h via RESPIRATORY_TRACT
  Filled 2014-06-27: qty 20

## 2014-06-27 MED ORDER — ACETAMINOPHEN 650 MG RE SUPP: 650.0000 mg | Freq: Four times a day (QID) | RECTAL | Status: DC | PRN

## 2014-06-27 MED ORDER — DEXTROSE 5 % IV SOLN
INTRAVENOUS | Status: AC
  Filled 2014-06-27 (×2): qty 1

## 2014-06-27 MED ORDER — PIPERACILLIN-TAZOBACTAM 3.375 G IVPB
INTRAVENOUS | Status: AC
  Filled 2014-06-27: qty 50

## 2014-06-27 MED ORDER — PIPERACILLIN-TAZOBACTAM 3.375 G IVPB
3.3750 g | Freq: Three times a day (TID) | INTRAVENOUS | Status: DC
  Administered 2014-06-27 – 2014-06-28 (×2): 3.375 g via INTRAVENOUS
  Filled 2014-06-27 (×3): qty 50

## 2014-06-27 MED ORDER — ACETAMINOPHEN 325 MG PO TABS: 650.0000 mg | ORAL_TABLET | Freq: Four times a day (QID) | ORAL | Status: DC | PRN

## 2014-06-27 MED ORDER — ONDANSETRON HCL 4 MG PO TABS: 4.0000 mg | ORAL_TABLET | Freq: Four times a day (QID) | ORAL | Status: DC | PRN

## 2014-06-27 MED ORDER — IPRATROPIUM-ALBUTEROL 0.5-2.5 (3) MG/3ML IN SOLN
3.0000 mL | RESPIRATORY_TRACT | Status: DC
  Administered 2014-06-27 – 2014-06-30 (×15): 3 mL via RESPIRATORY_TRACT
  Filled 2014-06-27 (×16): qty 3

## 2014-06-27 MED ORDER — CEFEPIME HCL 1 G IJ SOLR
1.0000 g | Freq: Three times a day (TID) | INTRAMUSCULAR | Status: DC
  Administered 2014-06-27 – 2014-06-29 (×5): 1 g via INTRAVENOUS
  Filled 2014-06-27 (×15): qty 1

## 2014-06-27 MED ORDER — SODIUM CHLORIDE 0.9 % IV SOLN: 250.0000 mL | INTRAVENOUS | Status: DC | PRN

## 2014-06-27 MED ORDER — ONDANSETRON HCL 4 MG/2ML IJ SOLN: 4.0000 mg | Freq: Four times a day (QID) | INTRAMUSCULAR | Status: DC | PRN

## 2014-06-27 MED ORDER — METOPROLOL TARTRATE 50 MG PO TABS
100.0000 mg | ORAL_TABLET | Freq: Every day | ORAL | Status: DC
  Administered 2014-06-27 – 2014-06-29 (×3): 100 mg via ORAL
  Filled 2014-06-27 (×3): qty 2

## 2014-06-27 MED ORDER — SODIUM CHLORIDE 0.9 % IJ SOLN
3.0000 mL | Freq: Two times a day (BID) | INTRAMUSCULAR | Status: DC
  Administered 2014-06-29: 3 mL via INTRAVENOUS

## 2014-06-27 MED ORDER — VANCOMYCIN HCL IN DEXTROSE 1-5 GM/200ML-% IV SOLN
1000.0000 mg | INTRAVENOUS | Status: AC
  Administered 2014-06-27 (×2): 1000 mg via INTRAVENOUS
  Filled 2014-06-27: qty 200

## 2014-06-27 MED ORDER — FUROSEMIDE 10 MG/ML IJ SOLN
20.0000 mg | Freq: Once | INTRAMUSCULAR | Status: AC
  Administered 2014-06-27: 20 mg via INTRAVENOUS
  Filled 2014-06-27: qty 2

## 2014-06-27 NOTE — ED Notes (Signed)
Persistent sob s/p inpatient tx for pneumonia, placed on prednisone 2 days ago, worse day

## 2014-06-27 NOTE — Progress Notes (Signed)
ANTIBIOTIC CONSULT NOTE - INITIAL  Pharmacy Consult for Vancomycin & Zosyn Indication: pneumonia  Allergies  Allergen Reactions  . Taxol [Paclitaxel] Shortness Of Breath    Decreased O2 sats.  . Plavix [Clopidogrel Bisulfate]     Hives and rash  . Other Rash    States that in 2004 he was placed on a blood thinner that made him break out in a rash.?plavix     Patient Measurements: Height: 5\' 10"  (177.8 cm) Weight: 274 lb (124.286 kg) IBW/kg (Calculated) : 73 Adjusted Body Weight:   Vital Signs: BP: 118/61 mmHg (11/18 1954) Pulse Rate: 103 (11/18 1954) Intake/Output from previous day:   Intake/Output from this shift:    Labs:  Recent Labs  06/27/14 1809  WBC 27.1*  HGB 12.3*  PLT 105*  CREATININE 0.98   Estimated Creatinine Clearance: 100.7 mL/min (by C-G formula based on Cr of 0.98). No results for input(s): VANCOTROUGH, VANCOPEAK, VANCORANDOM, GENTTROUGH, GENTPEAK, GENTRANDOM, TOBRATROUGH, TOBRAPEAK, TOBRARND, AMIKACINPEAK, AMIKACINTROU, AMIKACIN in the last 72 hours.   Microbiology: Recent Results (from the past 720 hour(s))  Culture, blood (routine x 2)     Status: None   Collection Time: 05/30/14  7:36 PM  Result Value Ref Range Status   Specimen Description BLOOD RIGHT HAND  Final   Special Requests BOTTLES DRAWN AEROBIC AND ANAEROBIC 6CC  Final   Culture NO GROWTH 5 DAYS  Final   Report Status 06/04/2014 FINAL  Final  Culture, blood (routine x 2)     Status: None   Collection Time: 05/30/14  7:45 PM  Result Value Ref Range Status   Specimen Description BLOOD LEFT HAND  Final   Special Requests BOTTLES DRAWN AEROBIC AND ANAEROBIC 6CC  Final   Culture NO GROWTH 5 DAYS  Final   Report Status 06/04/2014 FINAL  Final  MRSA PCR Screening     Status: None   Collection Time: 05/30/14  9:20 PM  Result Value Ref Range Status   MRSA by PCR NEGATIVE NEGATIVE Final    Comment:        The GeneXpert MRSA Assay (FDA approved for NASAL specimens only), is one  component of a comprehensive MRSA colonization surveillance program. It is not intended to diagnose MRSA infection nor to guide or monitor treatment for MRSA infections.    Medical History: Past Medical History  Diagnosis Date  . COPD (chronic obstructive pulmonary disease)   . Coronary artery disease   . Sleep apnea   . Hypertension   . High cholesterol   . MI (myocardial infarction) 2004  . GERD (gastroesophageal reflux disease)   . Family history of anesthesia complication     Daughter has nausea and vomiting  . Shortness of breath   . lung ca dx'd 10/2013    lungs x 3 spots  . Pneumonia 06/14/2014    Medications:  Scheduled:   Assessment: Obese patient Calculated normalized CrCl 75 ml/min  Goal of Therapy:  Vancomycin trough level 15-20 mcg/ml  Plan:  Zosyn 3.375 GM IV every 8 hours, infuse each dose over 4 hours Vancomycin 2 GM IV loading dose, then 1250 mg IV every 12 hours Vancomycin trough at steady state Monitor renal function Labs per protocol Abner Greenspan, Christon Gallaway Bennett 06/27/2014,8:00 PM

## 2014-06-27 NOTE — ED Notes (Signed)
CRITICAL VALUE ALERT  Critical value received:  PC02  Date of notification:  06/27/14  Time of notification:  1031  Critical value read back:Yes.    Nurse who received alert:  Chirstina Haan,RN  MD notified (1st page):  Ward  Time of first page:  [phone]

## 2014-06-27 NOTE — H&P (Signed)
[facility] History and Physical  Carlos Goodwin WUX:[id number] DOB: 12/03/1950 DOA: 06/27/2014  Referring physician: Dr. Leonides Schanz - [facility]  PCP: Alonza Bogus, MD   Chief Complaint: SOB  HPI: Carlos Goodwin is a 63 y.o. male  Started getting SOB 4 days ago. Little phlegm production. 2 days ago started on prednisone 40mg  and an ABX. Initially improved, but worsened overnight and into today. Felt like he couldn't breath.   Now on 2L at home. Initially after discharge would only use intermittently but needed continuous prior to current admission.  Of note pt admitted on 05/30/14 for CP r/o, HCAP and acute respiratory failure. At that time his ABG was pH 7.183, CO2 92.8, O2 76.6, Bicarb 33.5.  Saw oncology on 06/20/14 and was given Neupogen   Review of Systems:  Constitutional:  No fevers,  HEENT:  No headaches, Difficulty swallowing,Tooth/dental problems,Sore throat,  No sneezing, itching, ear ache, nasal congestion, post nasal drip,  Cardio-vascular:  No chest pain, Orthopnea, PND, swelling in lower extremities, anasarca, dizziness, palpitations  GI:  No heartburn, indigestion, abdominal pain, nausea, vomiting, diarrhea, change in bowel habits, loss of appetite  Resp:  Per HPI Skin:  no rash or lesions.  GU:  no dysuria, change in color of urine, no urgency or frequency. No flank pain.  Musculoskeletal:  No joint pain or swelling. No decreased range of motion. No back pain.  Psych:  No change in mood or affect. No depression or anxiety. No memory loss.   Past Medical History  Diagnosis Date  . COPD (chronic obstructive pulmonary disease)   . Coronary artery disease   . Sleep apnea   . Hypertension   . High cholesterol   . MI (myocardial infarction) 2004  . GERD (gastroesophageal reflux disease)   . Family history of anesthesia complication     Daughter has nausea and vomiting  . Shortness of breath   . lung ca dx'd 10/2013    lungs x 3 spots  . Pneumonia  06/14/2014   Past Surgical History  Procedure Laterality Date  . Appendectomy    . Coronary angioplasty  2004    1 stent  . Anterior cruciate ligament repair Right   . Tonsillectomy    . Prostate biopsy N/A 06/13/2013    Procedure: ULTRASOUND GUIDED PROSTATE BIOPSY;  Surgeon: Marissa Nestle, MD;  Location: [facility];  Service: Urology;  Laterality: N/A;  . Video bronchoscopy with endobronchial navigation N/A 12/12/2013    Procedure: VIDEO BRONCHOSCOPY WITH ENDOBRONCHIAL NAVIGATION;  Surgeon: Melrose Nakayama, MD;  Location: [facility];  Service: Thoracic;  Laterality: N/A;  . Video bronchoscopy with endobronchial ultrasound N/A 12/12/2013    Procedure: VIDEO BRONCHOSCOPY WITH ENDOBRONCHIAL ULTRASOUND;  Surgeon: Melrose Nakayama, MD;  Location: [facility];  Service: Thoracic;  Laterality: N/A;   Social History:  reports that he has been smoking Cigarettes.  He has a 50 pack-year smoking history. He has never used smokeless tobacco. He reports that he does not drink alcohol or use illicit drugs.  Allergies  Allergen Reactions  . Taxol [Paclitaxel] Shortness Of Breath    Decreased O2 sats.  . Plavix [Clopidogrel Bisulfate]     Hives and rash  . Other Rash    States that in 2004 he was placed on a blood thinner that made him break out in a rash.?plavix     Family History  Problem Relation Age of Onset  . Diabetes Mother   . Cancer Father  asssumed lung cancer  . Cancer Maternal Grandfather     prostate cancer     Prior to Admission medications   Medication Sig Start Date End Date Taking? Authorizing Provider  aspirin EC 81 MG tablet Take 81 mg by mouth at bedtime.   Yes Historical Provider, MD  cephALEXin (KEFLEX) 500 MG capsule Take 500 mg by mouth 3 (three) times daily. 06/25/14  Yes Historical Provider, MD  metoprolol (LOPRESSOR) 100 MG tablet Take 100 mg by mouth at bedtime.   Yes Historical Provider, MD  predniSONE (STERAPRED UNI-PAK) 10 MG tablet Take by mouth daily. Started on  decreasing doses 2 days ago   Yes Historical Provider, MD  ranitidine (ZANTAC) 150 MG tablet Take 150 mg by mouth daily.   Yes Historical Provider, MD  simvastatin (ZOCOR) 40 MG tablet Take 40 mg by mouth every evening.   Yes Historical Provider, MD  tiotropium (SPIRIVA) 18 MCG inhalation capsule Place 18 mcg into inhaler and inhale daily.   Yes Historical Provider, MD  albuterol (PROVENTIL HFA;VENTOLIN HFA) 108 (90 BASE) MCG/ACT inhaler Inhale 1-2 puffs into the lungs every 6 (six) hours as needed for wheezing or shortness of breath.    Historical Provider, MD  prochlorperazine (COMPAZINE) 10 MG tablet Take 1 tablet (10 mg total) by mouth every 6 (six) hours as needed for nausea or vomiting. 12/14/13   Curt Bears, MD  Wound Cleansers (RADIAPLEX EX) Apply topically.    Historical Provider, MD   Physical Exam: Filed Vitals:   06/27/14 1911 06/27/14 1954 06/27/14 2051 06/27/14 2104  BP: 109/49 118/61 119/63 137/59  Pulse: 93 103 109 111  Temp:    98.5 F (36.9 C)  TempSrc:    Oral  Resp: 22 24 20 20   Height:    5\' 10"  (1.778 m)  Weight:    128.7 kg (283 lb 11.7 oz)  SpO2: 96% 93% 86% 87%    Wt Readings from Last 3 Encounters:  06/27/14 128.7 kg (283 lb 11.7 oz)  06/20/14 127.37 kg (280 lb 12.8 oz)  06/01/14 129.9 kg (286 lb 6 oz)    General: Appears calm and comfortable Eyes: PERRL, normal lids, irises & conjunctiva ENT:  grossly normal hearing, lips & tongue Neck: no LAD, masses or thyromegaly Cardiovascular: RRR, no m/r/g. Respiratory: Diffuse wheezing in all lung fields w/ coruse breat wounds in bases, mild incrased WOB and on O2 [place] Abdomen: soft, ntnd Skin:  no rash or induration seen on limited exam Musculoskeletal:  grossly normal tone BUE/BLE Psychiatric:  grossly normal mood and affect, speech fluent and appropriate Neurologic:  grossly non-focal.          Labs on Admission:  Basic Metabolic Panel:  Recent Labs Lab 06/27/14 1809  NA 142  K 4.9  CL 96  CO2  34*  GLUCOSE 138*  BUN 22  CREATININE 0.98  CALCIUM 9.8   Liver Function Tests:  Recent Labs Lab 06/27/14 1809  AST 18  ALT 24  ALKPHOS 199*  BILITOT 0.2*  PROT 7.1  ALBUMIN 3.8   No results for input(s): LIPASE, AMYLASE in the last 168 hours. No results for input(s): AMMONIA in the last 168 hours. CBC:  Recent Labs Lab 06/27/14 1809  WBC 27.1*  NEUTROABS 25.2*  HGB 12.3*  HCT 38.3*  MCV 101.3*  PLT 105*   Cardiac Enzymes:  Recent Labs Lab 06/27/14 1809  TROPONINI <0.30    BNP (last 3 results)  Recent Labs  05/30/14 1656 06/27/14 1809  PROBNP 628.7* 1181.0*   CBG: No results for input(s): GLUCAP in the last 168 hours.  Radiological Exams on Admission: Dg Chest Port 1 View  06/27/2014   CLINICAL DATA:  Initial encounter for wheezing and shortness of breath, recent personal history of pneumonia, personal history of lung cancer  EXAM: PORTABLE CHEST - 1 VIEW  COMPARISON:  05/31/2014  FINDINGS: Stable moderate cardiac enlargement. Mild vascular congestion. Hazy density right lower lobe extending somewhat into the mid lung, similar to prior study again suggesting effusion and consolidation, slightly improved. There is also retrocardiac left lower lobe opacity, which is mildly more prominent when compared to the prior study. Known calcified pleural plaque anterior right upper lobe, measuring about 3 cm, stable.  IMPRESSION: Mildly worse left lower lobe opacity when compared to the prior study. Mildly improved right lower lobe opacity. There is likely a continued right effusion, with bilateral lower lobe airspace disease reflecting a combination of atelectasis and possibly pneumonitis.   Electronically Signed   By: Skipper Cliche M.D.   On: 06/27/2014 18:43    EKG: Independently reviewed. Sinus tach, no change since previous. No sign of ischemia   Assessment/Plan Principal Problem:   COPD exacerbation Active Problems:   Sleep apnea   Hypertension   High  cholesterol   Tobacco abuse   Cancer of right lung parenchyma   HCAP (healthcare-associated pneumonia)   Leukocytosis  COPD exacerbation/Acute Respiratory Failure: ABG pH 7.3 CO2 62.6, O2 62.4 Bicarb 35.4. On previous admission on 05/30/14 his ABG was pH 7.183, CO2 92.8, O2 76.6, Bicarb 33.5. Improving after breathing treatments and steroids. Likely initiated from HCAP as discussed below - Admit - Solumedrol 60mg  Q6 (0100 - DUonebs Q4 - cefepim and vanc as below (already given Vanc and Zos) - [place] O2, CPAP at night.   HCAP: unsure if pt ever cleared first HCAP given that his lungs and immune system are compromised due to his COPD and lung cancer. Vandc and Zos in ED. CXR showing worse LLL airspace disease. Unsure if this is pneumonia of fluid buildup from malignancy - Cefepime and Vanc (stop Zos) - IV Lasix 20mg  x1 - f/u BCX  Leukocytosis: WBC 27.1 on admission. Likely multifactorial. Some elevation from infection, stress and prednisone but pt received ?triple? Dose of Neupogen at last oncology appt on 06/20/14.  - CBC in am - other therapies as above  Lung Cancer: stable. Last chemo 05/30/14.  - f/u oncology upon DC  Tobacco abuse: continues to smoke 1/2ppd - Nicotine patch  HLD:  Continue zocor  HTN:  - continue metoprolol   Code Status: Limited - OK for intubation but no CPR DVT Prophylaxis: Hep  Family Communication: Wife adn daughter present for admission  Disposition Plan: pending improvement  Time spent: >70 min in direct pt care adn coordination  [place] [email or web address] Password [email or web address]

## 2014-06-27 NOTE — ED Provider Notes (Addendum)
TIME SEEN: 6:15 PM  CHIEF COMPLAINT: shortness of breath  HPI: Pt is a 63 y.o. M with history of COPD who recently was placed on home oxygen at 2 L nasal cannula, hypertension, hyperlipidemia, coronary artery disease, lung cancer currently receiving chemotherapy (last was May 30 2014), recent admission for healthcare associated pneumonia who was discharged on 06/02/14 with one week course of Levaquin who presents to the emergency department with increased shortness of breath over the past several days. Patient poor use of his primary care physician 2 days ago and was placed on prednisone for COPD exacerbation. He reports that he has just progressively gotten worse. He does have shortness of breath worse with exertion but now is occurring at rest. He does have chest pain but only when he takes his oxygen off. He has had a mild dry cough but no fever. No lower extremity swelling or pain. No history of PE or DVT.  Patient recently had a CT scan of his chest on 05/30/14 which showed no pulmonary embolus.  ROS: See HPI Constitutional: no fever  Eyes: no drainage  ENT: no runny nose   Cardiovascular:   chest pain  Resp: SOB  GI: no vomiting GU: no dysuria Integumentary: no rash  Allergy: no hives  Musculoskeletal: no leg swelling  Neurological: no slurred speech ROS otherwise negative  PAST MEDICAL HISTORY/PAST SURGICAL HISTORY:  Past Medical History  Diagnosis Date  . COPD (chronic obstructive pulmonary disease)   . Coronary artery disease   . Sleep apnea   . Hypertension   . High cholesterol   . MI (myocardial infarction) 2004  . GERD (gastroesophageal reflux disease)   . Family history of anesthesia complication     Daughter has nausea and vomiting  . Shortness of breath   . lung ca dx'd 10/2013    lungs x 3 spots  . Pneumonia 06/14/2014    MEDICATIONS:  Prior to Admission medications   Medication Sig Start Date End Date Taking? Authorizing Provider  albuterol (PROVENTIL  HFA;VENTOLIN HFA) 108 (90 BASE) MCG/ACT inhaler Inhale 1-2 puffs into the lungs every 6 (six) hours as needed for wheezing or shortness of breath.    Historical Provider, MD  aspirin EC 81 MG tablet Take 81 mg by mouth at bedtime.    Historical Provider, MD  metoprolol (LOPRESSOR) 100 MG tablet Take 100 mg by mouth at bedtime.    Historical Provider, MD  prochlorperazine (COMPAZINE) 10 MG tablet Take 1 tablet (10 mg total) by mouth every 6 (six) hours as needed for nausea or vomiting. 12/14/13   Curt Bears, MD  ranitidine (ZANTAC) 150 MG tablet Take 150 mg by mouth daily.    Historical Provider, MD  simvastatin (ZOCOR) 40 MG tablet Take 40 mg by mouth every evening.    Historical Provider, MD  tiotropium (SPIRIVA) 18 MCG inhalation capsule Place 18 mcg into inhaler and inhale daily.    Historical Provider, MD  Wound Cleansers (RADIAPLEX EX) Apply topically.    Historical Provider, MD    ALLERGIES:  Allergies  Allergen Reactions  . Taxol [Paclitaxel] Shortness Of Breath    Decreased O2 sats.  . Plavix [Clopidogrel Bisulfate]     Hives and rash  . Other Rash    States that in 2004 he was placed on a blood thinner that made him break out in a rash.?plavix     SOCIAL HISTORY:  History  Substance Use Topics  . Smoking status: Current Every Day Smoker -- 1.00 packs/day  for 50 years    Types: Cigarettes  . Smokeless tobacco: Never Used     Comment: Pt wants to quit smoking on his own; wants no materials  . Alcohol Use: No     Comment: social alcohol    FAMILY HISTORY: Family History  Problem Relation Age of Onset  . Diabetes Mother   . Cancer Father     asssumed lung cancer  . Cancer Maternal Grandfather     prostate cancer    EXAM: Pulse 101  Resp 28  Ht 5\' 10"  (1.778 m)  Wt 274 lb (124.286 kg)  BMI 39.32 kg/m2  SpO2 65% CONSTITUTIONAL: Alert and oriented and responds appropriately to questions. In mild respiratory distress HEAD: Normocephalic EYES: Conjunctivae clear,  PERRL ENT: normal nose; no rhinorrhea; moist mucous membranes; pharynx without lesions noted NECK: Supple, no meningismus, no LAD  CARD: RRR; S1 and S2 appreciated; no murmurs, no clicks, no rubs, no gallops RESP: Normal chest excursion without splinting; patient is mildly tachypneic, speaking short sentences, diffuse expiratory wheezing, diminished at his bases bilaterally, oxygen saturation 98-92% on 2 L at rest ABD/GI: Normal bowel sounds; non-distended; soft, non-tender, no rebound, no guarding BACK:  The back appears normal and is non-tender to palpation, there is no CVA tenderness EXT: Normal ROM in all joints; non-tender to palpation; no edema; normal capillary refill; no cyanosis    SKIN: Normal color for age and race; warm NEURO: Moves all extremities equally PSYCH: The patient's mood and manner are appropriate. Grooming and personal hygiene are appropriate.  MEDICAL DECISION MAKING: Pt here with possible COPD exacerbation. He was initially extremely hypoxic in triage but reports he was not wearing his home oxygen. He does have diffuse expiratory wheezing and has been on prednisone for the past several days. He does not use breathing treatments at home but has been using an albuterol inhaler. No recent infectious symptoms but was recently here for age. He does have risk factors for pulmonary embolus but has had a negative recent CT scan of his chest. Will obtain cardiac labs, chest x-ray. We'll give continuous albuterol, Solu-Medrol. Patient does not significantly improve I feel he will need admission to the hospital.  ED PROGRESS: Patient has a leukocytosis of 27.1 however patient has been on Neupogen and prednisone.  He also has mildly worse left lower lobe opacity when compared to prior studies. Right lower lobe opacities improving. Given concern for possible healthcare associated pneumonia, will give IV vancomycin and Zosyn. Will obtain blood cultures.  Troponin negative.  He is still  having diffuse expiratory wheezing and mild tachypnea but reports he is feeling better. Will admit for possible healthcare associated pneumonia and COPD exacerbation.   7:54 PM  Spoke with Dr. Ferne Coe for admission to tele, inpt.     EKG Interpretation  Date/Time:  Wednesday June 27 2014 18:04:13 EST Ventricular Rate:  101 PR Interval:  143 QRS Duration: 83 QT Interval:  355 QTC Calculation: 460 R Axis:   70 Text Interpretation:  Sinus tachycardia Low voltage, precordial leads No significant change since last tracing Confirmed by Ramey Ketcherside,  DO, Twanisha Foulk ([id number]) on 06/27/2014 6:12:56 PM        CRITICAL CARE Performed by: Nyra Jabs   Total critical care time: 40 minutes  Critical care time was exclusive of separately billable procedures and treating other patients.  Critical care was necessary to treat or prevent imminent or life-threatening deterioration.  Critical care was time spent personally by me on the following  activities: development of treatment plan with patient and/or surrogate as well as nursing, discussions with consultants, evaluation of patient's response to treatment, examination of patient, obtaining history from patient or surrogate, ordering and performing treatments and interventions, ordering and review of laboratory studies, ordering and review of radiographic studies, pulse oximetry and re-evaluation of patient's condition.   [place], DO 06/27/14 [place], DO 06/27/14 1956

## 2014-06-28 LAB — COMPREHENSIVE METABOLIC PANEL
ALK PHOS: 203 U/L — AB (ref 39–117)
ALT: 21 U/L (ref 0–53)
AST: 16 U/L (ref 0–37)
Albumin: 3.7 g/dL (ref 3.5–5.2)
Anion gap: 12 (ref 5–15)
BILIRUBIN TOTAL: 0.2 mg/dL — AB (ref 0.3–1.2)
BUN: 25 mg/dL — ABNORMAL HIGH (ref 6–23)
CALCIUM: 9.5 mg/dL (ref 8.4–10.5)
CHLORIDE: 97 meq/L (ref 96–112)
CO2: 33 meq/L — AB (ref 19–32)
Creatinine, Ser: 1.13 mg/dL (ref 0.50–1.35)
GFR calc Af Amer: 78 mL/min — ABNORMAL LOW (ref >90)
GFR, EST NON AFRICAN AMERICAN: 67 mL/min — AB (ref >90)
Glucose, Bld: 159 mg/dL — ABNORMAL HIGH (ref 70–99)
POTASSIUM: 5.1 meq/L (ref 3.7–5.3)
SODIUM: 142 meq/L (ref 137–147)
Total Protein: 7.1 g/dL (ref 6.0–8.3)

## 2014-06-28 LAB — CBC
HCT: 40.9 % (ref 39.0–52.0)
Hemoglobin: 12.6 g/dL — ABNORMAL LOW (ref 13.0–17.0)
MCH: 31.4 pg (ref 26.0–34.0)
MCHC: 30.8 g/dL (ref 30.0–36.0)
MCV: 102 fL — AB (ref 78.0–100.0)
PLATELETS: 109 10*3/uL — AB (ref 150–400)
RBC: 4.01 MIL/uL — ABNORMAL LOW (ref 4.22–5.81)
RDW: 15.3 % (ref 11.5–15.5)
WBC: 33.1 10*3/uL — ABNORMAL HIGH (ref 4.0–10.5)

## 2014-06-28 MED ORDER — SIMETHICONE 80 MG PO CHEW
160.0000 mg | CHEWABLE_TABLET | Freq: Four times a day (QID) | ORAL | Status: DC | PRN
  Administered 2014-06-28: 160 mg via ORAL
  Filled 2014-06-28: qty 2

## 2014-06-28 MED ORDER — CETYLPYRIDINIUM CHLORIDE 0.05 % MT LIQD
7.0000 mL | Freq: Two times a day (BID) | OROMUCOSAL | Status: DC
  Administered 2014-06-28 – 2014-06-30 (×5): 7 mL via OROMUCOSAL

## 2014-06-28 NOTE — Plan of Care (Signed)
Problem: Phase I Progression Outcomes Goal: Pain controlled with appropriate interventions Outcome: Completed/Met Date Met:  06/28/14 Goal: OOB as tolerated unless otherwise ordered Outcome: Completed/Met Date Met:  06/28/14 Goal: Initial discharge plan identified Outcome: Completed/Met Date Met:  06/28/14 Goal: Voiding-avoid urinary catheter unless indicated Outcome: Completed/Met Date Met:  06/28/14 Goal: Hemodynamically stable Outcome: Completed/Met Date Met:  06/28/14

## 2014-06-28 NOTE — Progress Notes (Signed)
Subjective: He was admitted last night with COPD exacerbation. He still has some infiltrate on his chest x-ray but that may be related to his previous pneumonia. He says he feels better but is still short of breath and coughing.  Objective: Vital signs in last 24 hours: Temp:  [97.5 F (36.4 C)-98.5 F (36.9 C)] 97.5 F (36.4 C) (11/19 0543) Pulse Rate:  [80-111] 80 (11/19 0543) Resp:  [18-28] 18 (11/19 0543) BP: (109-137)/(49-73) 123/73 mmHg (11/19 0543) SpO2:  [65 %-96 %] 93 % (11/19 0732) Weight:  [124.286 kg (274 lb)-128.7 kg (283 lb 11.7 oz)] 128.7 kg (283 lb 11.7 oz) (11/18 2104) Weight change:  Last BM Date: 06/27/14  Intake/Output from previous day: 11/18 0701 - 11/19 0700 In: 680 [P.O.:480; IV Piggyback:200] Out: -   PHYSICAL EXAM General appearance: alert, cooperative, mild distress and morbidly obese Resp: rhonchi bilaterally Cardio: regular rate and rhythm, S1, S2 normal, no murmur, click, rub or gallop GI: soft, non-tender; bowel sounds normal; no masses,  no organomegaly Extremities: extremities normal, atraumatic, no cyanosis or edema  Lab Results:  Results for orders placed or performed during the hospital encounter of 06/27/14 (from the past 48 hour(s))  CBC with Differential     Status: Abnormal   Collection Time: 06/27/14  6:09 PM  Result Value Ref Range   WBC 27.1 (H) 4.0 - 10.5 K/uL   RBC 3.78 (L) 4.22 - 5.81 MIL/uL   Hemoglobin 12.3 (L) 13.0 - 17.0 g/dL   HCT 38.3 (L) 39.0 - 52.0 %   MCV 101.3 (H) 78.0 - 100.0 fL   MCH 32.5 26.0 - 34.0 pg   MCHC 32.1 30.0 - 36.0 g/dL   RDW 15.3 11.5 - 15.5 %   Platelets 105 (L) 150 - 400 K/uL   Neutrophils Relative % 93 (H) 43 - 77 %   Lymphocytes Relative 1 (L) 12 - 46 %   Monocytes Relative 6 3 - 12 %   Eosinophils Relative 0 0 - 5 %   Basophils Relative 0 0 - 1 %   Neutro Abs 25.2 (H) 1.7 - 7.7 K/uL   Lymphs Abs 0.3 (L) 0.7 - 4.0 K/uL   Monocytes Absolute 1.6 (H) 0.1 - 1.0 K/uL   Eosinophils Absolute 0.0  0.0 - 0.7 K/uL   Basophils Absolute 0.0 0.0 - 0.1 K/uL   RBC Morphology RARE NRBCs    WBC Morphology WHITE COUNT CONFIRMED ON SMEAR     Comment: MILD LEFT SHIFT (1-5% METAS, OCC MYELO, OCC BANDS) TOXIC GRANULATION    Smear Review PLATELET COUNT CONFIRMED BY SMEAR     Comment: PLATELETS APPEAR DECREASED LARGE PLATELETS PRESENT GIANT PLATELETS SEEN   Comprehensive metabolic panel     Status: Abnormal   Collection Time: 06/27/14  6:09 PM  Result Value Ref Range   Sodium 142 137 - 147 mEq/L   Potassium 4.9 3.7 - 5.3 mEq/L   Chloride 96 96 - 112 mEq/L   CO2 34 (H) 19 - 32 mEq/L   Glucose, Bld 138 (H) 70 - 99 mg/dL   BUN 22 6 - 23 mg/dL   Creatinine, Ser 0.98 0.50 - 1.35 mg/dL   Calcium 9.8 8.4 - 10.5 mg/dL   Total Protein 7.1 6.0 - 8.3 g/dL   Albumin 3.8 3.5 - 5.2 g/dL   AST 18 0 - 37 U/L   ALT 24 0 - 53 U/L   Alkaline Phosphatase 199 (H) 39 - 117 U/L   Total Bilirubin 0.2 (L) 0.3 -  1.2 mg/dL   GFR calc non Af Amer 85 (L) >90 mL/min   GFR calc Af Amer >90 >90 mL/min    Comment: (NOTE) The eGFR has been calculated using the CKD EPI equation. This calculation has not been validated in all clinical situations. eGFR's persistently <90 mL/min signify possible Chronic Kidney Disease.    Anion gap 12 5 - 15  Troponin I     Status: None   Collection Time: 06/27/14  6:09 PM  Result Value Ref Range   Troponin I <0.30 <0.30 ng/mL    Comment:        Due to the release kinetics of cTnI, a negative result within the first hours of the onset of symptoms does not rule out myocardial infarction with certainty. If myocardial infarction is still suspected, repeat the test at appropriate intervals.   Pro b natriuretic peptide (BNP)     Status: Abnormal   Collection Time: 06/27/14  6:09 PM  Result Value Ref Range   Pro B Natriuretic peptide (BNP) 1181.0 (H) 0 - 125 pg/mL  Blood gas, arterial ([facility] & [facility] ONLY)     Status: Abnormal   Collection Time: 06/27/14  6:20 PM  Result Value Ref Range    O2 Content 4.0 L/min   Delivery systems NASAL CANNULA    pH, Arterial 7.371 7.350 - 7.450   pCO2 arterial 62.6 (HH) 35.0 - 45.0 mmHg    Comment: CRITICAL RESULT CALLED TO, READ BACK BY AND VERIFIED WITH: OSBORNE,T.RN 06/27/14 AT 1833 BY BROADNAX,L.RRT    pO2, Arterial 62.4 (L) 80.0 - 100.0 mmHg   Bicarbonate 35.4 (H) 20.0 - 24.0 mEq/L   TCO2 32.4 0 - 100 mmol/L   Acid-Base Excess 10.0 (H) 0.0 - 2.0 mmol/L   O2 Saturation 90.3 %   Patient temperature 37.0    Collection site RIGHT RADIAL    Drawn by [id number]    Sample type ARTERIAL    Allens test (pass/fail) PASS PASS  Blood culture (routine x 2)     Status: None (Preliminary result)   Collection Time: 06/27/14  7:33 PM  Result Value Ref Range   Specimen Description BLOOD RIGHT ARM    Special Requests BOTTLES DRAWN AEROBIC AND ANAEROBIC 6CC EACH    Culture PENDING    Report Status PENDING   I-Stat CG4 Lactic Acid, ED     Status: None   Collection Time: 06/27/14  7:40 PM  Result Value Ref Range   Lactic Acid, Venous 1.45 0.5 - 2.2 mmol/L  Blood culture (routine x 2)     Status: None (Preliminary result)   Collection Time: 06/27/14  7:43 PM  Result Value Ref Range   Specimen Description BLOOD LEFT HAND    Special Requests BOTTLES DRAWN AEROBIC AND ANAEROBIC 8CC EACH    Culture PENDING    Report Status PENDING   Comprehensive metabolic panel     Status: Abnormal   Collection Time: 06/28/14  7:05 AM  Result Value Ref Range   Sodium 142 137 - 147 mEq/L   Potassium 5.1 3.7 - 5.3 mEq/L   Chloride 97 96 - 112 mEq/L   CO2 33 (H) 19 - 32 mEq/L   Glucose, Bld 159 (H) 70 - 99 mg/dL   BUN 25 (H) 6 - 23 mg/dL   Creatinine, Ser 1.13 0.50 - 1.35 mg/dL   Calcium 9.5 8.4 - 10.5 mg/dL   Total Protein 7.1 6.0 - 8.3 g/dL   Albumin 3.7 3.5 - 5.2 g/dL  AST 16 0 - 37 U/L   ALT 21 0 - 53 U/L   Alkaline Phosphatase 203 (H) 39 - 117 U/L   Total Bilirubin 0.2 (L) 0.3 - 1.2 mg/dL   GFR calc non Af Amer 67 (L) >90 mL/min   GFR calc Af Amer 78  (L) >90 mL/min    Comment: (NOTE) The eGFR has been calculated using the CKD EPI equation. This calculation has not been validated in all clinical situations. eGFR's persistently <90 mL/min signify possible Chronic Kidney Disease.    Anion gap 12 5 - 15  CBC     Status: Abnormal   Collection Time: 06/28/14  7:05 AM  Result Value Ref Range   WBC 33.1 (H) 4.0 - 10.5 K/uL   RBC 4.01 (L) 4.22 - 5.81 MIL/uL   Hemoglobin 12.6 (L) 13.0 - 17.0 g/dL   HCT 40.9 39.0 - 52.0 %   MCV 102.0 (H) 78.0 - 100.0 fL   MCH 31.4 26.0 - 34.0 pg   MCHC 30.8 30.0 - 36.0 g/dL   RDW 15.3 11.5 - 15.5 %   Platelets 109 (L) 150 - 400 K/uL    Comment: SPECIMEN CHECKED FOR CLOTS PLATELET COUNT CONFIRMED BY SMEAR     ABGS  Recent Labs  06/27/14 1820  PHART 7.371  PO2ART 62.4*  TCO2 32.4  HCO3 35.4*   CULTURES Recent Results (from the past 240 hour(s))  Blood culture (routine x 2)     Status: None (Preliminary result)   Collection Time: 06/27/14  7:33 PM  Result Value Ref Range Status   Specimen Description BLOOD RIGHT ARM  Final   Special Requests BOTTLES DRAWN AEROBIC AND ANAEROBIC [facility] EACH  Final   Culture PENDING  Incomplete   Report Status PENDING  Incomplete  Blood culture (routine x 2)     Status: None (Preliminary result)   Collection Time: 06/27/14  7:43 PM  Result Value Ref Range Status   Specimen Description BLOOD LEFT HAND  Final   Special Requests BOTTLES DRAWN AEROBIC AND ANAEROBIC [place]  Final   Culture PENDING  Incomplete   Report Status PENDING  Incomplete   Studies/Results: Dg Chest Port 1 View  06/27/2014   CLINICAL DATA:  Initial encounter for wheezing and shortness of breath, recent personal history of pneumonia, personal history of lung cancer  EXAM: PORTABLE CHEST - 1 VIEW  COMPARISON:  05/31/2014  FINDINGS: Stable moderate cardiac enlargement. Mild vascular congestion. Hazy density right lower lobe extending somewhat into the mid lung, similar to prior study again  suggesting effusion and consolidation, slightly improved. There is also retrocardiac left lower lobe opacity, which is mildly more prominent when compared to the prior study. Known calcified pleural plaque anterior right upper lobe, measuring about 3 cm, stable.  IMPRESSION: Mildly worse left lower lobe opacity when compared to the prior study. Mildly improved right lower lobe opacity. There is likely a continued right effusion, with bilateral lower lobe airspace disease reflecting a combination of atelectasis and possibly pneumonitis.   Electronically Signed   By: Skipper Cliche M.D.   On: 06/27/2014 18:43    Medications:  Prior to Admission:  Prescriptions prior to admission  Medication Sig Dispense Refill Last Dose  . aspirin EC 81 MG tablet Take 81 mg by mouth at bedtime.   06/26/2014 at Unknown time  . cephALEXin (KEFLEX) 500 MG capsule Take 500 mg by mouth 3 (three) times daily.  0 06/27/2014 at Unknown time  . metoprolol (LOPRESSOR)  100 MG tablet Take 100 mg by mouth at bedtime.   06/26/2014 at 2100  . predniSONE (STERAPRED UNI-PAK) 10 MG tablet Take by mouth daily. Started on decreasing doses 2 days ago   06/27/2014 at 11am  . ranitidine (ZANTAC) 150 MG tablet Take 150 mg by mouth daily.   06/26/2014 at Unknown time  . simvastatin (ZOCOR) 40 MG tablet Take 40 mg by mouth every evening.   06/26/2014 at Unknown time  . tiotropium (SPIRIVA) 18 MCG inhalation capsule Place 18 mcg into inhaler and inhale daily.   06/26/2014 at Unknown time  . albuterol (PROVENTIL HFA;VENTOLIN HFA) 108 (90 BASE) MCG/ACT inhaler Inhale 1-2 puffs into the lungs every 6 (six) hours as needed for wheezing or shortness of breath.   unknown  . prochlorperazine (COMPAZINE) 10 MG tablet Take 1 tablet (10 mg total) by mouth every 6 (six) hours as needed for nausea or vomiting. 60 tablet 0 never  . Wound Cleansers (RADIAPLEX EX) Apply topically.   never   Scheduled: . antiseptic oral rinse  7 mL Mouth Rinse BID  .  aspirin EC  81 mg Oral QHS  . ceFEPime (MAXIPIME) IV  1 g Intravenous 3 times per day  . heparin  5,000 Units Subcutaneous 3 times per day  . ipratropium-albuterol  3 mL Nebulization Q4H  . methylPREDNISolone (SOLU-MEDROL) injection  60 mg Intravenous Q6H  . metoprolol  100 mg Oral QHS  . nicotine  14 mg Transdermal Daily  . piperacillin-tazobactam (ZOSYN)  IV  3.375 g Intravenous Q8H  . simvastatin  40 mg Oral QPM  . sodium chloride  3 mL Intravenous Q12H  . vancomycin  1,250 mg Intravenous Q12H   Continuous: . albuterol 15 mg/hr (06/27/14 1904)   UMP:NTIRWE chloride, acetaminophen **OR** acetaminophen, ondansetron **OR** ondansetron (ZOFRAN) IV, sodium chloride  Assesment: He is admitted with COPD exacerbation and perhaps some element of healthcare associated pneumonia. He failed outpatient treatment. In addition to that he has known have coronary artery occlusive disease although this seems stable, lung cancer which is being treated and his white blood count I believe is elevated because of receiving a bone marrow stimulant. He has sleep apnea on C Pap and he's using that in the hospital Principal Problem:   COPD exacerbation Active Problems:   Sleep apnea   Hypertension   High cholesterol   Tobacco abuse   Cancer of right lung parenchyma   HCAP (healthcare-associated pneumonia)   Leukocytosis    Plan: Continue current treatments    LOS: 1 day   Ronette Hank L 06/28/2014, 8:40 AM

## 2014-06-28 NOTE — Care Management Utilization Note (Signed)
UR review complete.  

## 2014-06-29 MED ORDER — GUAIFENESIN ER 600 MG PO TB12
1200.0000 mg | ORAL_TABLET | Freq: Two times a day (BID) | ORAL | Status: DC
  Administered 2014-06-29 – 2014-06-30 (×3): 1200 mg via ORAL
  Filled 2014-06-29 (×3): qty 2

## 2014-06-29 MED ORDER — ALUM & MAG HYDROXIDE-SIMETH 200-200-20 MG/5ML PO SUSP
30.0000 mL | Freq: Once | ORAL | Status: AC
Start: 2014-06-29 — End: 2014-06-29
  Administered 2014-06-29: 30 mL via ORAL
  Filled 2014-06-29: qty 30

## 2014-06-29 MED ORDER — PREDNISONE 20 MG PO TABS
40.0000 mg | ORAL_TABLET | Freq: Every day | ORAL | Status: DC
  Administered 2014-06-30: 40 mg via ORAL
  Filled 2014-06-29: qty 2

## 2014-06-29 MED ORDER — LEVOFLOXACIN 750 MG PO TABS
750.0000 mg | ORAL_TABLET | Freq: Every day | ORAL | Status: DC
  Administered 2014-06-29 – 2014-06-30 (×2): 750 mg via ORAL
  Filled 2014-06-29 (×2): qty 1

## 2014-06-29 NOTE — Progress Notes (Signed)
Attempt to restart IV unsuccessful x2. Patient c/o pain below IV site. Blood return noted when flushed patient c/o pain when IV flushed.

## 2014-06-29 NOTE — Plan of Care (Signed)
Problem: Phase I Progression Outcomes Goal: OOB as tolerated unless otherwise ordered Outcome: Progressing Ambulated hall tolerated well

## 2014-06-29 NOTE — Plan of Care (Signed)
Problem: Phase I Progression Outcomes Goal: Dyspnea controlled at rest Outcome: Progressing Oxygen in use via nasal cannula

## 2014-06-29 NOTE — Progress Notes (Signed)
Several attempts made to restart IV. PACU nurse attempt with use of ultrasound not able to restart IV page MD x4 to inform him of no IV site. No return call at this time.

## 2014-06-29 NOTE — Progress Notes (Signed)
Informed Dr. Willey Blade of patient not having IV site and several attempts made to start IV . New orders given.

## 2014-06-29 NOTE — Progress Notes (Signed)
Subjective: He says he feels better. He has no new complaints. His breathing is okay. He is still coughing.  Objective: Vital signs in last 24 hours: Temp:  [97.5 F (36.4 C)-98.3 F (36.8 C)] 97.5 F (36.4 C) (11/20 0536) Pulse Rate:  [58-87] 58 (11/20 0536) Resp:  [18] 18 (11/20 0536) BP: (124-136)/(62-71) 136/71 mmHg (11/20 0536) SpO2:  [87 %-98 %] 89 % (11/20 0741) Weight change:  Last BM Date: 06/27/14  Intake/Output from previous day: 11/19 0701 - 11/20 0700 In: 780 [P.O.:480; IV Piggyback:300] Out: -   PHYSICAL EXAM General appearance: alert, cooperative and mild distress Resp: rhonchi bilaterally Cardio: regular rate and rhythm, S1, S2 normal, no murmur, click, rub or gallop GI: soft, non-tender; bowel sounds normal; no masses,  no organomegaly Extremities: extremities normal, atraumatic, no cyanosis or edema  Lab Results:  Results for orders placed or performed during the hospital encounter of 06/27/14 (from the past 48 hour(s))  CBC with Differential     Status: Abnormal   Collection Time: 06/27/14  6:09 PM  Result Value Ref Range   WBC 27.1 (H) 4.0 - 10.5 K/uL   RBC 3.78 (L) 4.22 - 5.81 MIL/uL   Hemoglobin 12.3 (L) 13.0 - 17.0 g/dL   HCT 38.3 (L) 39.0 - 52.0 %   MCV 101.3 (H) 78.0 - 100.0 fL   MCH 32.5 26.0 - 34.0 pg   MCHC 32.1 30.0 - 36.0 g/dL   RDW 15.3 11.5 - 15.5 %   Platelets 105 (L) 150 - 400 K/uL   Neutrophils Relative % 93 (H) 43 - 77 %   Lymphocytes Relative 1 (L) 12 - 46 %   Monocytes Relative 6 3 - 12 %   Eosinophils Relative 0 0 - 5 %   Basophils Relative 0 0 - 1 %   Neutro Abs 25.2 (H) 1.7 - 7.7 K/uL   Lymphs Abs 0.3 (L) 0.7 - 4.0 K/uL   Monocytes Absolute 1.6 (H) 0.1 - 1.0 K/uL   Eosinophils Absolute 0.0 0.0 - 0.7 K/uL   Basophils Absolute 0.0 0.0 - 0.1 K/uL   RBC Morphology RARE NRBCs    WBC Morphology WHITE COUNT CONFIRMED ON SMEAR     Comment: MILD LEFT SHIFT (1-5% METAS, OCC MYELO, OCC BANDS) TOXIC GRANULATION    Smear Review  PLATELET COUNT CONFIRMED BY SMEAR     Comment: PLATELETS APPEAR DECREASED LARGE PLATELETS PRESENT GIANT PLATELETS SEEN   Comprehensive metabolic panel     Status: Abnormal   Collection Time: 06/27/14  6:09 PM  Result Value Ref Range   Sodium 142 137 - 147 mEq/L   Potassium 4.9 3.7 - 5.3 mEq/L   Chloride 96 96 - 112 mEq/L   CO2 34 (H) 19 - 32 mEq/L   Glucose, Bld 138 (H) 70 - 99 mg/dL   BUN 22 6 - 23 mg/dL   Creatinine, Ser 0.98 0.50 - 1.35 mg/dL   Calcium 9.8 8.4 - 10.5 mg/dL   Total Protein 7.1 6.0 - 8.3 g/dL   Albumin 3.8 3.5 - 5.2 g/dL   AST 18 0 - 37 U/L   ALT 24 0 - 53 U/L   Alkaline Phosphatase 199 (H) 39 - 117 U/L   Total Bilirubin 0.2 (L) 0.3 - 1.2 mg/dL   GFR calc non Af Amer 85 (L) >90 mL/min   GFR calc Af Amer >90 >90 mL/min    Comment: (NOTE) The eGFR has been calculated using the CKD EPI equation. This calculation has not  been validated in all clinical situations. eGFR's persistently <90 mL/min signify possible Chronic Kidney Disease.    Anion gap 12 5 - 15  Troponin I     Status: None   Collection Time: 06/27/14  6:09 PM  Result Value Ref Range   Troponin I <0.30 <0.30 ng/mL    Comment:        Due to the release kinetics of cTnI, a negative result within the first hours of the onset of symptoms does not rule out myocardial infarction with certainty. If myocardial infarction is still suspected, repeat the test at appropriate intervals.   Pro b natriuretic peptide (BNP)     Status: Abnormal   Collection Time: 06/27/14  6:09 PM  Result Value Ref Range   Pro B Natriuretic peptide (BNP) 1181.0 (H) 0 - 125 pg/mL  Blood gas, arterial ([facility] & [facility] ONLY)     Status: Abnormal   Collection Time: 06/27/14  6:20 PM  Result Value Ref Range   O2 Content 4.0 L/min   Delivery systems NASAL CANNULA    pH, Arterial 7.371 7.350 - 7.450   pCO2 arterial 62.6 (HH) 35.0 - 45.0 mmHg    Comment: CRITICAL RESULT CALLED TO, READ BACK BY AND VERIFIED WITH: OSBORNE,T.RN 06/27/14 AT  1833 BY BROADNAX,L.RRT    pO2, Arterial 62.4 (L) 80.0 - 100.0 mmHg   Bicarbonate 35.4 (H) 20.0 - 24.0 mEq/L   TCO2 32.4 0 - 100 mmol/L   Acid-Base Excess 10.0 (H) 0.0 - 2.0 mmol/L   O2 Saturation 90.3 %   Patient temperature 37.0    Collection site RIGHT RADIAL    Drawn by [id number]    Sample type ARTERIAL    Allens test (pass/fail) PASS PASS  Blood culture (routine x 2)     Status: None (Preliminary result)   Collection Time: 06/27/14  7:33 PM  Result Value Ref Range   Specimen Description BLOOD RIGHT ARM    Special Requests BOTTLES DRAWN AEROBIC AND ANAEROBIC 6CC EACH    Culture NO GROWTH 1 DAY    Report Status PENDING   I-Stat CG4 Lactic Acid, ED     Status: None   Collection Time: 06/27/14  7:40 PM  Result Value Ref Range   Lactic Acid, Venous 1.45 0.5 - 2.2 mmol/L  Blood culture (routine x 2)     Status: None (Preliminary result)   Collection Time: 06/27/14  7:43 PM  Result Value Ref Range   Specimen Description BLOOD LEFT HAND    Special Requests BOTTLES DRAWN AEROBIC AND ANAEROBIC 8CC EACH    Culture NO GROWTH 1 DAY    Report Status PENDING   Comprehensive metabolic panel     Status: Abnormal   Collection Time: 06/28/14  7:05 AM  Result Value Ref Range   Sodium 142 137 - 147 mEq/L   Potassium 5.1 3.7 - 5.3 mEq/L   Chloride 97 96 - 112 mEq/L   CO2 33 (H) 19 - 32 mEq/L   Glucose, Bld 159 (H) 70 - 99 mg/dL   BUN 25 (H) 6 - 23 mg/dL   Creatinine, Ser 1.13 0.50 - 1.35 mg/dL   Calcium 9.5 8.4 - 10.5 mg/dL   Total Protein 7.1 6.0 - 8.3 g/dL   Albumin 3.7 3.5 - 5.2 g/dL   AST 16 0 - 37 U/L   ALT 21 0 - 53 U/L   Alkaline Phosphatase 203 (H) 39 - 117 U/L   Total Bilirubin 0.2 (L) 0.3 - 1.2 mg/dL  GFR calc non Af Amer 67 (L) >90 mL/min   GFR calc Af Amer 78 (L) >90 mL/min    Comment: (NOTE) The eGFR has been calculated using the CKD EPI equation. This calculation has not been validated in all clinical situations. eGFR's persistently <90 mL/min signify possible Chronic  Kidney Disease.    Anion gap 12 5 - 15  CBC     Status: Abnormal   Collection Time: 06/28/14  7:05 AM  Result Value Ref Range   WBC 33.1 (H) 4.0 - 10.5 K/uL   RBC 4.01 (L) 4.22 - 5.81 MIL/uL   Hemoglobin 12.6 (L) 13.0 - 17.0 g/dL   HCT 40.9 39.0 - 52.0 %   MCV 102.0 (H) 78.0 - 100.0 fL   MCH 31.4 26.0 - 34.0 pg   MCHC 30.8 30.0 - 36.0 g/dL   RDW 15.3 11.5 - 15.5 %   Platelets 109 (L) 150 - 400 K/uL    Comment: SPECIMEN CHECKED FOR CLOTS PLATELET COUNT CONFIRMED BY SMEAR     ABGS  Recent Labs  06/27/14 1820  PHART 7.371  PO2ART 62.4*  TCO2 32.4  HCO3 35.4*   CULTURES Recent Results (from the past 240 hour(s))  Blood culture (routine x 2)     Status: None (Preliminary result)   Collection Time: 06/27/14  7:33 PM  Result Value Ref Range Status   Specimen Description BLOOD RIGHT ARM  Final   Special Requests BOTTLES DRAWN AEROBIC AND ANAEROBIC 6CC EACH  Final   Culture NO GROWTH 1 DAY  Final   Report Status PENDING  Incomplete  Blood culture (routine x 2)     Status: None (Preliminary result)   Collection Time: 06/27/14  7:43 PM  Result Value Ref Range Status   Specimen Description BLOOD LEFT HAND  Final   Special Requests BOTTLES DRAWN AEROBIC AND ANAEROBIC [place]  Final   Culture NO GROWTH 1 DAY  Final   Report Status PENDING  Incomplete   Studies/Results: Dg Chest Port 1 View  06/27/2014   CLINICAL DATA:  Initial encounter for wheezing and shortness of breath, recent personal history of pneumonia, personal history of lung cancer  EXAM: PORTABLE CHEST - 1 VIEW  COMPARISON:  05/31/2014  FINDINGS: Stable moderate cardiac enlargement. Mild vascular congestion. Hazy density right lower lobe extending somewhat into the mid lung, similar to prior study again suggesting effusion and consolidation, slightly improved. There is also retrocardiac left lower lobe opacity, which is mildly more prominent when compared to the prior study. Known calcified pleural plaque anterior  right upper lobe, measuring about 3 cm, stable.  IMPRESSION: Mildly worse left lower lobe opacity when compared to the prior study. Mildly improved right lower lobe opacity. There is likely a continued right effusion, with bilateral lower lobe airspace disease reflecting a combination of atelectasis and possibly pneumonitis.   Electronically Signed   By: Skipper Cliche M.D.   On: 06/27/2014 18:43    Medications:  Prior to Admission:  Prescriptions prior to admission  Medication Sig Dispense Refill Last Dose  . albuterol (PROVENTIL HFA;VENTOLIN HFA) 108 (90 BASE) MCG/ACT inhaler Inhale 1-2 puffs into the lungs every 6 (six) hours as needed for wheezing or shortness of breath.   unknown  . aspirin EC 81 MG tablet Take 81 mg by mouth at bedtime.   06/26/2014 at Unknown time  . cephALEXin (KEFLEX) 500 MG capsule Take 500 mg by mouth 3 (three) times daily.  0 06/27/2014 at Unknown time  .  metoprolol (LOPRESSOR) 100 MG tablet Take 100 mg by mouth at bedtime.   06/26/2014 at 2100  . predniSONE (STERAPRED UNI-PAK) 10 MG tablet Take by mouth daily. Started on decreasing doses 2 days ago   06/27/2014 at 11am  . ranitidine (ZANTAC) 150 MG tablet Take 150 mg by mouth daily.   06/26/2014 at Unknown time  . simvastatin (ZOCOR) 40 MG tablet Take 40 mg by mouth every evening.   06/26/2014 at Unknown time  . tiotropium (SPIRIVA) 18 MCG inhalation capsule Place 18 mcg into inhaler and inhale daily.   06/26/2014 at Unknown time  . prochlorperazine (COMPAZINE) 10 MG tablet Take 1 tablet (10 mg total) by mouth every 6 (six) hours as needed for nausea or vomiting. (Patient not taking: Reported on 06/28/2014) 60 tablet 0 never   Scheduled: . antiseptic oral rinse  7 mL Mouth Rinse BID  . aspirin EC  81 mg Oral QHS  . ceFEPime (MAXIPIME) IV  1 g Intravenous 3 times per day  . guaiFENesin  1,200 mg Oral BID  . heparin  5,000 Units Subcutaneous 3 times per day  . ipratropium-albuterol  3 mL Nebulization Q4H  .  methylPREDNISolone (SOLU-MEDROL) injection  60 mg Intravenous Q6H  . metoprolol  100 mg Oral QHS  . nicotine  14 mg Transdermal Daily  . simvastatin  40 mg Oral QPM  . sodium chloride  3 mL Intravenous Q12H  . vancomycin  1,250 mg Intravenous Q12H   Continuous:  BPJ:PETKKO chloride, acetaminophen **OR** acetaminophen, ondansetron **OR** ondansetron (ZOFRAN) IV, simethicone, sodium chloride  Assesment: He is admitted with healthcare associated pneumonia/COPD exacerbation. He is doing much better and I think he'll probably be able to go home tomorrow Principal Problem:   COPD exacerbation Active Problems:   Sleep apnea   Hypertension   High cholesterol   Tobacco abuse   Cancer of right lung parenchyma   HCAP (healthcare-associated pneumonia)   Leukocytosis    Plan: Possible discharge tomorrow    LOS: 2 days   Loney Domingo L 06/29/2014, 9:11 AM

## 2014-06-29 NOTE — Plan of Care (Signed)
Problem: Phase I Progression Outcomes Goal: Code status addressed with pt/family Outcome: Completed/Met Date Met:  06/29/14 Partial code status

## 2014-06-29 NOTE — Care Management Note (Signed)
    Page 1 of 1   06/29/2014     11:50:06 AM CARE MANAGEMENT NOTE 06/29/2014  Patient:  Carlos Goodwin, Carlos Goodwin   Account Number:  [id number]  Date Initiated:  06/29/2014  Documentation initiated by:  Jolene Provost  Subjective/Objective Assessment:   Pt is from home, independent with ADL's and has no HH services or medications needs prior to admission. Pt has cont. home O2 though [facility] prior to admission.     Action/Plan:   Pt plans to discharge home with self care. No CM needs identified at this time.   Anticipated DC Date:  06/30/2014   Anticipated DC Plan:  [place]  CM consult      PAC Choice  DURABLE MEDICAL EQUIPMENT   Choice offered to / List presented to:  C-1 Patient   DME arranged  [facility]      DME agency  [place].        Status of service:  Completed, signed off Medicare Important Message given?  YES (If response is "NO", the following Medicare IM given date fields will be blank) Date Medicare IM given:  06/29/2014 Medicare IM given by:  Vladimir Creeks Date Additional Medicare IM given:   Additional Medicare IM given by:    Discharge Disposition:  HOME/SELF CARE  Per UR Regulation:    If discussed at Long Length of Stay Meetings, dates discussed:    Comments:  06/29/2014 [place], RN, MSN, [facility]

## 2014-06-29 NOTE — Progress Notes (Signed)
Patient does not want to wear CPAP machine tonight-says he just wants to wear 02. Pt on 5LNC 02 94%. RT will continue to monitor

## 2014-06-29 NOTE — Plan of Care (Signed)
Problem: Phase I Progression Outcomes Goal: First antibiotic given within 6hrs of admit Outcome: Completed/Met Date Met:  06/29/14

## 2014-06-29 NOTE — Progress Notes (Signed)
Patient c/o pain at IV site Vancomycin infusing. IV stopped. No redness noted at site.

## 2014-06-29 NOTE — Plan of Care (Signed)
Problem: Phase I Progression Outcomes Goal: Voiding-avoid urinary catheter unless indicated Outcome: Completed/Met Date Met:  06/29/14

## 2014-06-30 MED ORDER — LEVOFLOXACIN 750 MG PO TABS: 750.0000 mg | ORAL_TABLET | Freq: Every day | ORAL | Status: DC

## 2014-06-30 NOTE — Progress Notes (Signed)
Discharge instruction reviewed with patient. No distress noted. Patient escorted to lobby in a wheelchair by nurse tech.

## 2014-06-30 NOTE — Discharge Summary (Signed)
Physician Discharge Summary  Patient ID: Carlos Goodwin MRN: [id number] DOB/AGE: 1951-06-28 63 y.o. Primary Care Physician:Bertie Simien L, MD Admit date: 06/27/2014 Discharge date: 06/30/2014    Discharge Diagnoses:   Principal Problem:   COPD exacerbation Active Problems:   Sleep apnea   Hypertension   High cholesterol   Tobacco abuse   Cancer of right lung parenchyma   HCAP (healthcare-associated pneumonia)   Leukocytosis     Medication List    STOP taking these medications        cephALEXin 500 MG capsule  Commonly known as:  KEFLEX      TAKE these medications        albuterol 108 (90 BASE) MCG/ACT inhaler  Commonly known as:  PROVENTIL HFA;VENTOLIN HFA  Inhale 1-2 puffs into the lungs every 6 (six) hours as needed for wheezing or shortness of breath.     aspirin EC 81 MG tablet  Take 81 mg by mouth at bedtime.     levofloxacin 750 MG tablet  Commonly known as:  LEVAQUIN  Take 1 tablet (750 mg total) by mouth daily.     metoprolol 100 MG tablet  Commonly known as:  LOPRESSOR  Take 100 mg by mouth at bedtime.     predniSONE 10 MG tablet  Commonly known as:  STERAPRED UNI-PAK  Take by mouth daily. Started on decreasing doses 2 days ago     prochlorperazine 10 MG tablet  Commonly known as:  COMPAZINE  Take 1 tablet (10 mg total) by mouth every 6 (six) hours as needed for nausea or vomiting.     ranitidine 150 MG tablet  Commonly known as:  ZANTAC  Take 150 mg by mouth daily.     simvastatin 40 MG tablet  Commonly known as:  ZOCOR  Take 40 mg by mouth every evening.     tiotropium 18 MCG inhalation capsule  Commonly known as:  SPIRIVA  Place 18 mcg into inhaler and inhale daily.        Discharged Condition: Improved    Consults: None  Significant Diagnostic Studies: Dg Chest Port 1 View  06/27/2014   CLINICAL DATA:  Initial encounter for wheezing and shortness of breath, recent personal history of pneumonia, personal history of lung  cancer  EXAM: PORTABLE CHEST - 1 VIEW  COMPARISON:  05/31/2014  FINDINGS: Stable moderate cardiac enlargement. Mild vascular congestion. Hazy density right lower lobe extending somewhat into the mid lung, similar to prior study again suggesting effusion and consolidation, slightly improved. There is also retrocardiac left lower lobe opacity, which is mildly more prominent when compared to the prior study. Known calcified pleural plaque anterior right upper lobe, measuring about 3 cm, stable.  IMPRESSION: Mildly worse left lower lobe opacity when compared to the prior study. Mildly improved right lower lobe opacity. There is likely a continued right effusion, with bilateral lower lobe airspace disease reflecting a combination of atelectasis and possibly pneumonitis.   Electronically Signed   By: Skipper Cliche M.D.   On: 06/27/2014 18:43    Lab Results: Basic Metabolic Panel:  Recent Labs  06/27/14 1809 06/28/14 0705  NA 142 142  K 4.9 5.1  CL 96 97  CO2 34* 33*  GLUCOSE 138* 159*  BUN 22 25*  CREATININE 0.98 1.13  CALCIUM 9.8 9.5   Liver Function Tests:  Recent Labs  06/27/14 1809 06/28/14 0705  AST 18 16  ALT 24 21  ALKPHOS 199* 203*  BILITOT 0.2* 0.2*  PROT 7.1  7.1  ALBUMIN 3.8 3.7     CBC:  Recent Labs  06/27/14 1809 06/28/14 0705  WBC 27.1* 33.1*  NEUTROABS 25.2*  --   HGB 12.3* 12.6*  HCT 38.3* 40.9  MCV 101.3* 102.0*  PLT 105* 109*    Recent Results (from the past 240 hour(s))  Blood culture (routine x 2)     Status: None (Preliminary result)   Collection Time: 06/27/14  7:33 PM  Result Value Ref Range Status   Specimen Description BLOOD RIGHT ARM  Final   Special Requests BOTTLES DRAWN AEROBIC AND ANAEROBIC 6CC EACH  Final   Culture NO GROWTH 3 DAYS  Final   Report Status PENDING  Incomplete  Blood culture (routine x 2)     Status: None (Preliminary result)   Collection Time: 06/27/14  7:43 PM  Result Value Ref Range Status   Specimen Description  BLOOD LEFT HAND  Final   Special Requests BOTTLES DRAWN AEROBIC AND ANAEROBIC [place]  Final   Culture NO GROWTH 3 DAYS  Final   Report Status PENDING  Incomplete     Hospital Course: This is a 63 year old who has a known history of COPD and who is being treated for lung cancer with radiation and chemotherapy. He call my office about 48 hours prior to admission and said he was having cough and congestion. He did not want to come to the emergency department at that time. He was started on antibiotics and steroids and initially did a little better and then developed increasing problems and eventually did come to the emergency department and he was admitted from there. He was started on intravenous antibiotics and intravenous steroids and improved markedly quickly. He lost IV access and was switched to oral medications about 12 hours prior to discharge. He did well with this. He is back at baseline.  Discharge Exam: Blood pressure 105/62, pulse 70, temperature 98 F (36.7 C), temperature source Oral, resp. rate 20, height 5\' 10"  (1.778 m), weight 128.7 kg (283 lb 11.7 oz), SpO2 92 %. He is awake and alert. He has rhonchi on his chest exam. He is morbidly obese.  Disposition: Home he does not want home health services      Discharge Instructions    Discharge patient    Complete by:  As directed              Signed: [place] L   06/30/2014, 12:04 PM

## 2014-06-30 NOTE — Progress Notes (Signed)
Subjective: He feels much better. He wants to go home. He lost his IV access last night and he is on oral medications now.  Objective: Vital signs in last 24 hours: Temp:  [97.8 F (36.6 C)-98 F (36.7 C)] 98 F (36.7 C) (11/21 0649) Pulse Rate:  [70-75] 70 (11/21 0649) Resp:  [20] 20 (11/20 2246) BP: (105-134)/(62-68) 105/62 mmHg (11/21 0649) SpO2:  [92 %-99 %] 92 % (11/21 1145) Weight change:  Last BM Date: 06/27/14  Intake/Output from previous day:    PHYSICAL EXAM General appearance: alert, cooperative, no distress and morbidly obese Resp: clear to auscultation bilaterally Cardio: regular rate and rhythm, S1, S2 normal, no murmur, click, rub or gallop GI: soft, non-tender; bowel sounds normal; no masses,  no organomegaly Extremities: extremities normal, atraumatic, no cyanosis or edema  Lab Results:  No results found for this or any previous visit (from the past 48 hour(s)).  ABGS  Recent Labs  06/27/14 1820  PHART 7.371  PO2ART 62.4*  TCO2 32.4  HCO3 35.4*   CULTURES Recent Results (from the past 240 hour(s))  Blood culture (routine x 2)     Status: None (Preliminary result)   Collection Time: 06/27/14  7:33 PM  Result Value Ref Range Status   Specimen Description BLOOD RIGHT ARM  Final   Special Requests BOTTLES DRAWN AEROBIC AND ANAEROBIC 6CC EACH  Final   Culture NO GROWTH 3 DAYS  Final   Report Status PENDING  Incomplete  Blood culture (routine x 2)     Status: None (Preliminary result)   Collection Time: 06/27/14  7:43 PM  Result Value Ref Range Status   Specimen Description BLOOD LEFT HAND  Final   Special Requests BOTTLES DRAWN AEROBIC AND ANAEROBIC [place]  Final   Culture NO GROWTH 3 DAYS  Final   Report Status PENDING  Incomplete   Studies/Results: No results found.  Medications:  Prior to Admission:  Prescriptions prior to admission  Medication Sig Dispense Refill Last Dose  . albuterol (PROVENTIL HFA;VENTOLIN HFA) 108 (90 BASE) MCG/ACT  inhaler Inhale 1-2 puffs into the lungs every 6 (six) hours as needed for wheezing or shortness of breath.   unknown  . aspirin EC 81 MG tablet Take 81 mg by mouth at bedtime.   06/26/2014 at Unknown time  . cephALEXin (KEFLEX) 500 MG capsule Take 500 mg by mouth 3 (three) times daily.  0 06/27/2014 at Unknown time  . metoprolol (LOPRESSOR) 100 MG tablet Take 100 mg by mouth at bedtime.   06/26/2014 at 2100  . predniSONE (STERAPRED UNI-PAK) 10 MG tablet Take by mouth daily. Started on decreasing doses 2 days ago   06/27/2014 at 11am  . ranitidine (ZANTAC) 150 MG tablet Take 150 mg by mouth daily.   06/26/2014 at Unknown time  . simvastatin (ZOCOR) 40 MG tablet Take 40 mg by mouth every evening.   06/26/2014 at Unknown time  . tiotropium (SPIRIVA) 18 MCG inhalation capsule Place 18 mcg into inhaler and inhale daily.   06/26/2014 at Unknown time  . prochlorperazine (COMPAZINE) 10 MG tablet Take 1 tablet (10 mg total) by mouth every 6 (six) hours as needed for nausea or vomiting. (Patient not taking: Reported on 06/28/2014) 60 tablet 0 never   Scheduled: . antiseptic oral rinse  7 mL Mouth Rinse BID  . aspirin EC  81 mg Oral QHS  . guaiFENesin  1,200 mg Oral BID  . heparin  5,000 Units Subcutaneous 3 times per day  .  ipratropium-albuterol  3 mL Nebulization Q4H  . levofloxacin  750 mg Oral Daily  . metoprolol  100 mg Oral QHS  . nicotine  14 mg Transdermal Daily  . predniSONE  40 mg Oral Q breakfast  . simvastatin  40 mg Oral QPM  . sodium chloride  3 mL Intravenous Q12H   Continuous:  AFB:XUXYBF chloride, acetaminophen **OR** acetaminophen, ondansetron **OR** ondansetron (ZOFRAN) IV, simethicone, sodium chloride  Assesment: He was admitted with healthcare associated pneumonia and COPD exacerbation. He is known to have a cancer in the right lung which is being treated with chemotherapy and radiation. He had leukocytosis from bone marrow stimulation. He feels much better. He is ready for  discharge Principal Problem:   COPD exacerbation Active Problems:   Sleep apnea   Hypertension   High cholesterol   Tobacco abuse   Cancer of right lung parenchyma   HCAP (healthcare-associated pneumonia)   Leukocytosis    Plan: Discharge home    LOS: 3 days   Carlos Goodwin L 06/30/2014, 12:03 PM

## 2014-07-02 LAB — CULTURE, BLOOD (ROUTINE X 2)
CULTURE: NO GROWTH
CULTURE: NO GROWTH

## 2014-07-12 ENCOUNTER — Ambulatory Visit (HOSPITAL_BASED_OUTPATIENT_CLINIC_OR_DEPARTMENT_OTHER): Payer: Medicare Other | Admitting: Internal Medicine

## 2014-07-12 ENCOUNTER — Other Ambulatory Visit (HOSPITAL_BASED_OUTPATIENT_CLINIC_OR_DEPARTMENT_OTHER): Payer: Medicare Other

## 2014-07-12 ENCOUNTER — Telehealth: Payer: Self-pay | Admitting: Internal Medicine

## 2014-07-12 VITALS — BP 104/56 | HR 75 | Temp 97.9°F | Resp 19 | Ht 70.0 in | Wt 282.1 lb

## 2014-07-12 DIAGNOSIS — C3411 Malignant neoplasm of upper lobe, right bronchus or lung: Secondary | ICD-10-CM

## 2014-07-12 DIAGNOSIS — D61810 Antineoplastic chemotherapy induced pancytopenia: Secondary | ICD-10-CM

## 2014-07-12 DIAGNOSIS — C3491 Malignant neoplasm of unspecified part of right bronchus or lung: Secondary | ICD-10-CM

## 2014-07-12 DIAGNOSIS — D701 Agranulocytosis secondary to cancer chemotherapy: Secondary | ICD-10-CM

## 2014-07-12 DIAGNOSIS — T451X5A Adverse effect of antineoplastic and immunosuppressive drugs, initial encounter: Principal | ICD-10-CM

## 2014-07-12 LAB — CBC WITH DIFFERENTIAL/PLATELET
BASO%: 0.3 % (ref 0.0–2.0)
Basophils Absolute: 0 10*3/uL (ref 0.0–0.1)
EOS%: 0.9 % (ref 0.0–7.0)
Eosinophils Absolute: 0.1 10*3/uL (ref 0.0–0.5)
HCT: 42.2 % (ref 38.4–49.9)
HGB: 12.8 g/dL — ABNORMAL LOW (ref 13.0–17.1)
LYMPH#: 0.4 10*3/uL — AB (ref 0.9–3.3)
LYMPH%: 5.5 % — ABNORMAL LOW (ref 14.0–49.0)
MCH: 31.7 pg (ref 27.2–33.4)
MCHC: 30.3 g/dL — ABNORMAL LOW (ref 32.0–36.0)
MCV: 104.5 fL — ABNORMAL HIGH (ref 79.3–98.0)
MONO#: 0.8 10*3/uL (ref 0.1–0.9)
MONO%: 10 % (ref 0.0–14.0)
NEUT%: 83.3 % — ABNORMAL HIGH (ref 39.0–75.0)
NEUTROS ABS: 6.3 10*3/uL (ref 1.5–6.5)
Platelets: 157 10*3/uL (ref 140–400)
RBC: 4.04 10*6/uL — ABNORMAL LOW (ref 4.20–5.82)
RDW: 15.9 % — AB (ref 11.0–14.6)
WBC: 7.5 10*3/uL (ref 4.0–10.3)
nRBC: 0 % (ref 0–0)

## 2014-07-12 LAB — COMPREHENSIVE METABOLIC PANEL (CC13)
ALT: 18 U/L (ref 0–55)
ANION GAP: 10 meq/L (ref 3–11)
AST: 10 U/L (ref 5–34)
Albumin: 3.5 g/dL (ref 3.5–5.0)
Alkaline Phosphatase: 75 U/L (ref 40–150)
BILIRUBIN TOTAL: 0.28 mg/dL (ref 0.20–1.20)
BUN: 21.5 mg/dL (ref 7.0–26.0)
CO2: 33 meq/L — AB (ref 22–29)
CREATININE: 0.8 mg/dL (ref 0.7–1.3)
Calcium: 9.5 mg/dL (ref 8.4–10.4)
Chloride: 101 mEq/L (ref 98–109)
EGFR: >90 mL/min/{1.73_m2} (ref >90)
Glucose: 82 mg/dl (ref 70–140)
Potassium: 5.1 mEq/L (ref 3.5–5.1)
Sodium: 144 mEq/L (ref 136–145)
Total Protein: 6.5 g/dL (ref 6.4–8.3)

## 2014-07-12 NOTE — Progress Notes (Signed)
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  PRIOR THERAPY:   1) Concurrent chemoradiation with weekly carboplatin for AUC of 2 and paclitaxel 45 mg/M2, status post 7 cycle, with partial response. Paclitaxel was discontinued at cycle #3 secondary to hypersensitivity reaction.  2) Systemic chemotherapy with carboplatin for AUC of 5 on day 1 and gemcitabine 1000 mg/M2 on days 1 and 8 every 3 weeks. Status post 3 cycles. First dose 03/28/2014. Discontinue today secondary to intolerance with significant pancytopenia.   CURRENT THERAPY: Observation.  INTERVAL HISTORY: Carlos Goodwin 63 y.o. male returns to the clinic today for followup visit accompanied by his wife. He is feeling fine today except for the shortness of breath. He is currently on home oxygen. He was admitted again to the hospital recently for treatment of COPD exacerbation. He is feeling better today. He denied having any significant chest pain, or hemoptysis. He has cough productive of whitish sputum. He denied having any significant fever or chills, no nausea or vomiting. He has no significant weight loss or night sweats. He is currently off treatment secondary to frequent hospitalization with pneumonia and COPD. He is here for evaluation and discussion of his treatment options.   MEDICAL HISTORY: Past Medical History  Diagnosis Date  . COPD (chronic obstructive pulmonary disease)   . Coronary artery disease   . Sleep apnea   . Hypertension   . High cholesterol   . MI (myocardial infarction) 2004  . GERD (gastroesophageal reflux disease)   . Family history of anesthesia complication    Daughter has nausea and vomiting  . Shortness of breath   . lung ca dx'd 10/2013    lungs x 3 spots  . Pneumonia 06/14/2014    ALLERGIES:  is allergic to taxol and plavix.  MEDICATIONS:  Current Outpatient Prescriptions  Medication Sig Dispense Refill  . albuterol (PROVENTIL HFA;VENTOLIN HFA) 108 (90 BASE) MCG/ACT inhaler Inhale 1-2 puffs into the lungs every 6 (six) hours as needed for wheezing or shortness of breath.    Marland Kitchen aspirin EC 81 MG tablet Take 81 mg by mouth at bedtime.    . metoprolol (LOPRESSOR) 100 MG tablet Take 100 mg by mouth at bedtime.    . ranitidine (ZANTAC) 150 MG tablet Take 150 mg by mouth daily.    . simvastatin (ZOCOR) 40 MG tablet Take 40 mg by mouth every evening.    . tiotropium (SPIRIVA) 18 MCG inhalation capsule Place 18 mcg into inhaler and inhale daily.    . prochlorperazine (COMPAZINE) 10 MG tablet Take 1 tablet (10 mg total) by mouth every 6 (six) hours as needed for nausea or vomiting. (Patient not taking: Reported on 06/28/2014) 60 tablet 0   No current facility-administered medications for this visit.    SURGICAL HISTORY:  Past Surgical History  Procedure Laterality Date  . Appendectomy    . Coronary angioplasty  2004    1 stent  . Anterior cruciate ligament repair Right   . Tonsillectomy    . Prostate biopsy N/A 06/13/2013    Procedure: ULTRASOUND GUIDED PROSTATE BIOPSY;  Surgeon: Marissa Nestle, MD;  Location: [facility];  Service: Urology;  Laterality: N/A;  .  Video bronchoscopy with endobronchial navigation N/A 12/12/2013    Procedure: VIDEO BRONCHOSCOPY WITH ENDOBRONCHIAL NAVIGATION;  Surgeon: Melrose Nakayama, MD;  Location: [facility];  Service: Thoracic;  Laterality: N/A;  . Video bronchoscopy with endobronchial ultrasound N/A 12/12/2013    Procedure: VIDEO BRONCHOSCOPY WITH ENDOBRONCHIAL ULTRASOUND;  Surgeon: Melrose Nakayama, MD;  Location: [facility];  Service: Thoracic;  Laterality: N/A;    REVIEW OF SYSTEMS:  Constitutional: positive for  fatigue Eyes: negative Ears, nose, mouth, throat, and face: negative Respiratory: positive for cough and dyspnea on exertion Cardiovascular: negative Gastrointestinal: negative Genitourinary:negative Integument/breast: negative Hematologic/lymphatic: negative Musculoskeletal:negative Neurological: negative Behavioral/Psych: negative Endocrine: negative Allergic/Immunologic: negative   PHYSICAL EXAMINATION: General appearance: alert, cooperative and no distress Head: Normocephalic, without obvious abnormality, atraumatic Neck: no adenopathy, no JVD, supple, symmetrical, trachea midline and thyroid not enlarged, symmetric, no tenderness/mass/nodules Lymph nodes: Cervical, supraclavicular, and axillary nodes normal. Resp: clear to auscultation bilaterally Back: symmetric, no curvature. ROM normal. No CVA tenderness. Cardio: regular rate and rhythm, S1, S2 normal, no murmur, click, rub or gallop GI: soft, non-tender; bowel sounds normal; no masses,  no organomegaly Extremities: extremities normal, atraumatic, no cyanosis or edema Neurologic: Alert and oriented X 3, normal strength and tone. Normal symmetric reflexes. Normal coordination and gait  ECOG PERFORMANCE STATUS: 1 - Symptomatic but completely ambulatory  Blood pressure 104/56, pulse 75, temperature 97.9 F (36.6 C), temperature source Oral, resp. rate 19, height 5\' 10"  (1.778 m), weight 282 lb 1.6 oz (127.96 kg), SpO2 94 %.  LABORATORY DATA: Lab Results  Component Value Date   WBC 7.5 07/12/2014   HGB 12.8* 07/12/2014   HCT 42.2 07/12/2014   MCV 104.5* 07/12/2014   PLT 157 07/12/2014      Chemistry      Component Value Date/Time   NA 144 07/12/2014 0905   NA 142 06/28/2014 0705   K 5.1 07/12/2014 0905   K 5.1 06/28/2014 0705   CL 97 06/28/2014 0705   CO2 33* 07/12/2014 0905   CO2 33* 06/28/2014 0705   BUN 21.5 07/12/2014 0905   BUN 25* 06/28/2014 0705   CREATININE 0.8 07/12/2014 0905   CREATININE 1.13  06/28/2014 0705      Component Value Date/Time   CALCIUM 9.5 07/12/2014 0905   CALCIUM 9.5 06/28/2014 0705   ALKPHOS 75 07/12/2014 0905   ALKPHOS 203* 06/28/2014 0705   AST 10 07/12/2014 0905   AST 16 06/28/2014 0705   ALT 18 07/12/2014 0905   ALT 21 06/28/2014 0705   BILITOT 0.28 07/12/2014 0905   BILITOT 0.2* 06/28/2014 0705       RADIOGRAPHIC STUDIES: Dg Chest Port 1 View  06/27/2014   CLINICAL DATA:  Initial encounter for wheezing and shortness of breath, recent personal history of pneumonia, personal history of lung cancer  EXAM: PORTABLE CHEST - 1 VIEW  COMPARISON:  05/31/2014  FINDINGS: Stable moderate cardiac enlargement. Mild vascular congestion. Hazy density right lower lobe extending somewhat into the mid lung, similar to prior study again suggesting effusion and consolidation, slightly improved. There is also retrocardiac left lower lobe opacity, which is mildly more prominent when compared to the prior study. Known calcified pleural plaque anterior right upper lobe, measuring about 3 cm, stable.  IMPRESSION: Mildly worse left lower lobe opacity when compared to the prior study. Mildly improved right lower lobe opacity. There is likely a continued right effusion, with bilateral lower lobe airspace disease reflecting a combination of atelectasis and possibly pneumonitis.   Electronically Signed  By: Skipper Cliche M.D.   On: 06/27/2014 18:43    ASSESSMENT AND PLAN: This is a very pleasant 63 years old white male recently diagnosed with a stage IIIA non-small cell lung cancer, completed a course of concurrent chemoradiation with weekly carboplatin and paclitaxel is status post 7 cycle. He tolerated the first cycle of his treatment fairly well. Paclitaxel was discontinued secondary to hypersensitivity reaction at cycle #3. He was started on systemic chemotherapy with carboplatin and gemcitabine but unfortunately had significant pancytopenia and he has several delays as well as  missed treatment because of the pancytopenia. His CT angiogram after cycle #3 showed improvement in his disease but unfortunately the patient is unable to tolerate this treatment with the persistent pancytopenia and missed treatment. I recommended for the patient to continue on observation for now with repeat CT scan of the chest in 6 weeks. I would see him back for follow-up visit at that time and if he has evidence for disease progression, I may consider the patient for treatment with either immunotherapy or oral Tarceva. The patient and his wife agreed to the current plan. The patient was advised to call immediately if he has any concerning symptoms in the interval. The patient voices understanding of current disease status and treatment options and is in agreement with the current care plan.  All questions were answered. The patient knows to call the clinic with any problems, questions or concerns. We can certainly see the patient much sooner if necessary.  Disclaimer: This note was dictated with voice recognition software. Similar sounding words can inadvertently be transcribed and may not be corrected upon review.

## 2014-07-12 NOTE — Telephone Encounter (Signed)
gv adn printed appt sched and avs for pt for Jan 2016 °

## 2014-07-14 ENCOUNTER — Encounter: Payer: Self-pay | Admitting: Internal Medicine

## 2014-07-19 ENCOUNTER — Encounter (HOSPITAL_COMMUNITY): Payer: Self-pay | Admitting: Cardiovascular Disease

## 2014-08-10 DEATH — deceased

## 2014-08-20 ENCOUNTER — Ambulatory Visit (HOSPITAL_COMMUNITY): Payer: Medicare Other

## 2014-08-20 ENCOUNTER — Other Ambulatory Visit: Payer: Medicare Other

## 2014-08-27 ENCOUNTER — Ambulatory Visit: Payer: Medicare Other | Admitting: Internal Medicine

## 2015-10-04 IMAGING — US US EXTREM LOW VENOUS*R*
1 series · 14 of 24 positions shown · non-contrast
Comparison: None.

CLINICAL DATA: Right lower extremity erythema, pain and edema.

EXAM:
RIGHT LOWER EXTREMITY VENOUS DOPPLER ULTRASOUND
TECHNIQUE: Gray-scale sonography with graded compression, as well as color
Doppler and duplex ultrasound, were performed to evaluate the deep
venous system from the level of the common femoral vein through the
popliteal and proximal calf veins. Spectral Doppler was utilized to
evaluate flow at rest and with distal augmentation maneuvers.

[Series 1: us extrem low venous*right* · 0.08mm/px · 14 of 34 slices shown]
[im 1/34]
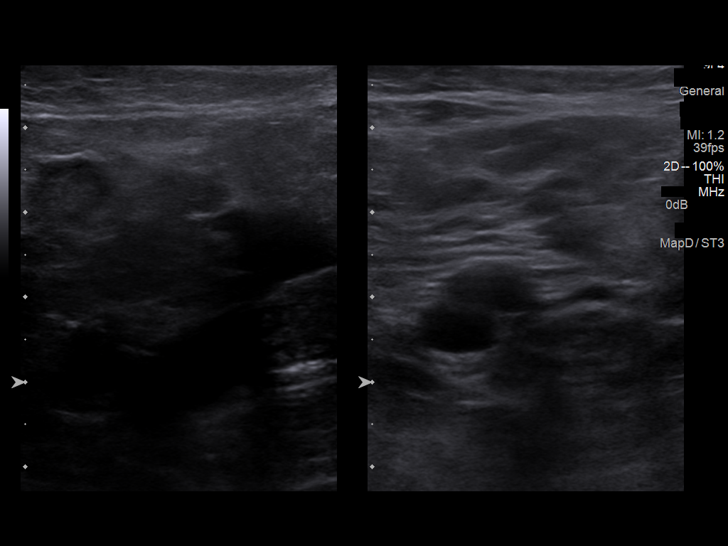
[im 3/34]
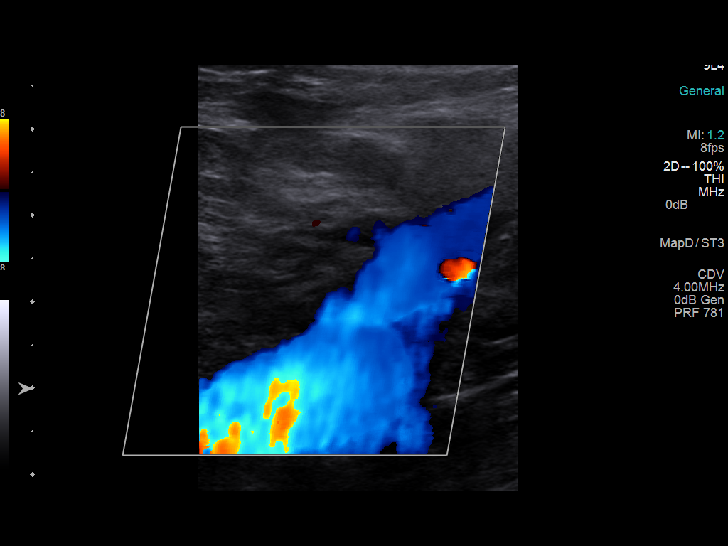
[im 6/34]
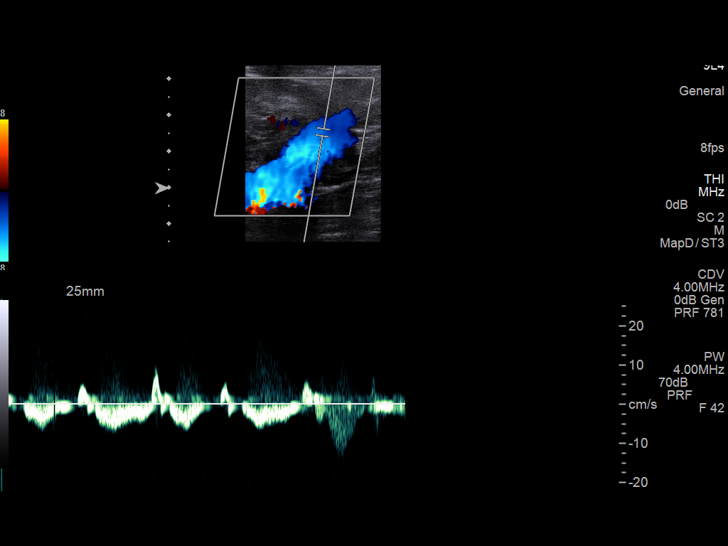
[im 9/34]
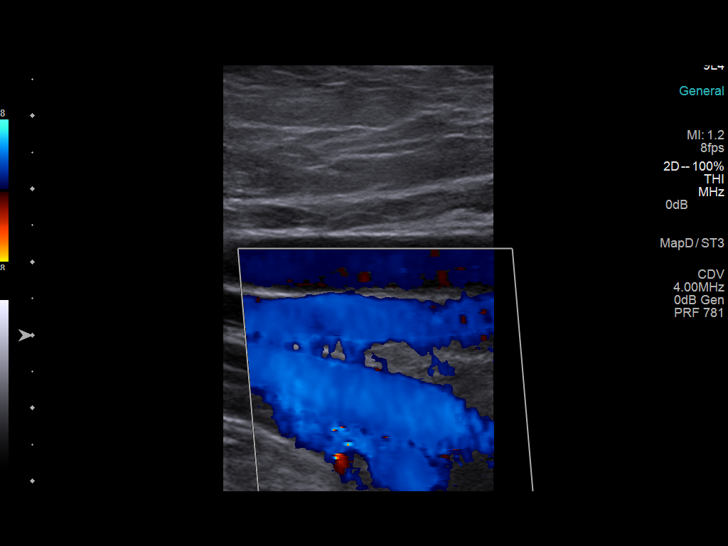
[im 11/34]
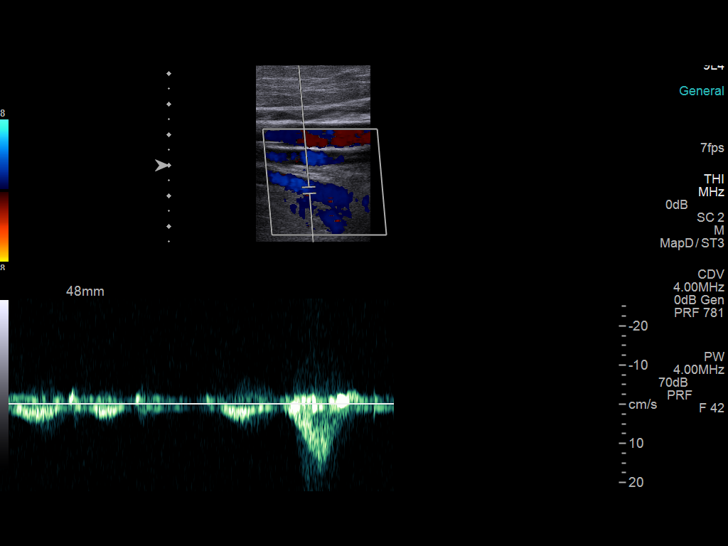
[im 13/34]
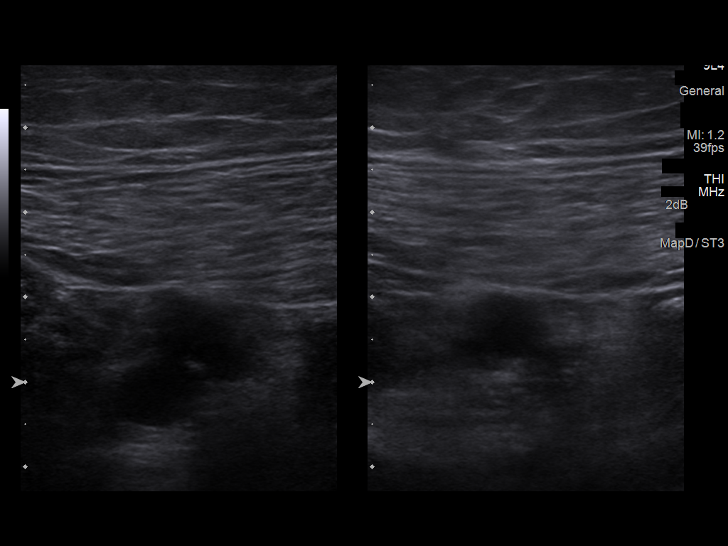
[im 16/34]
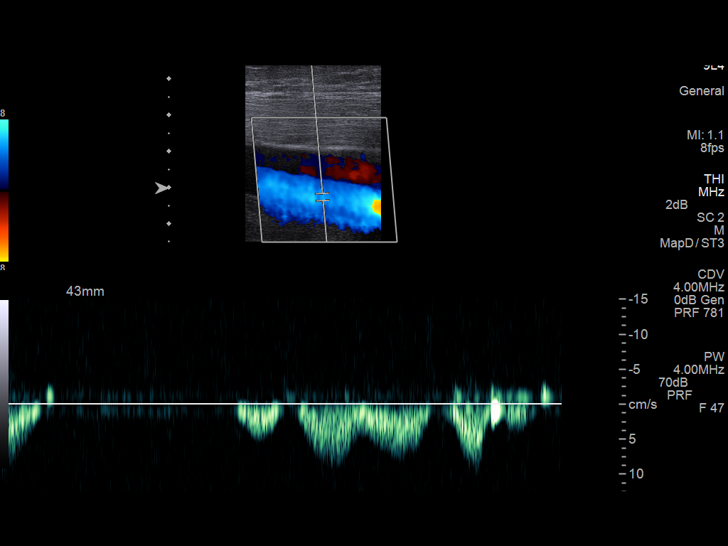
[im 18/34]
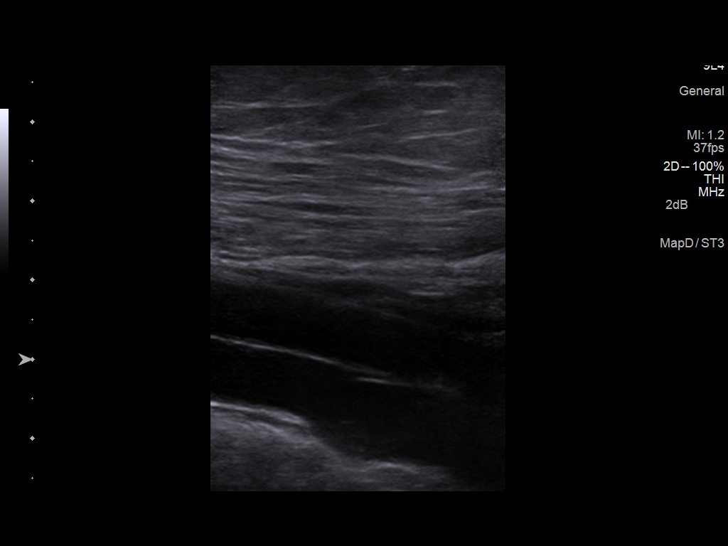
[im 21/34]
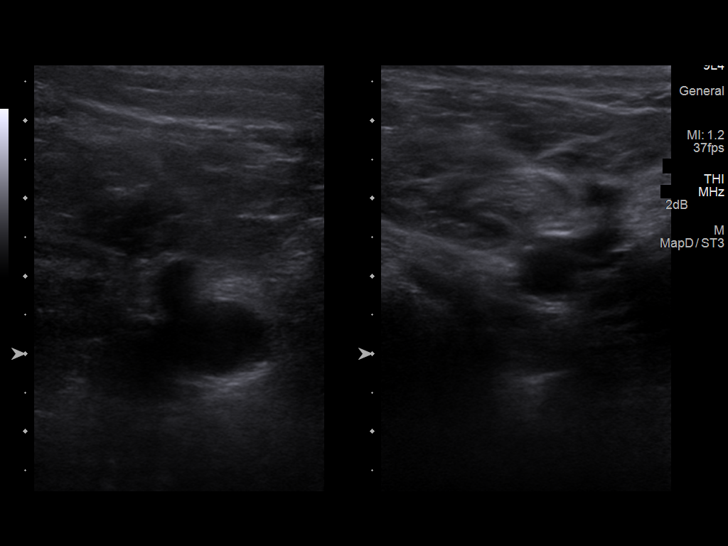
[im 23/34]
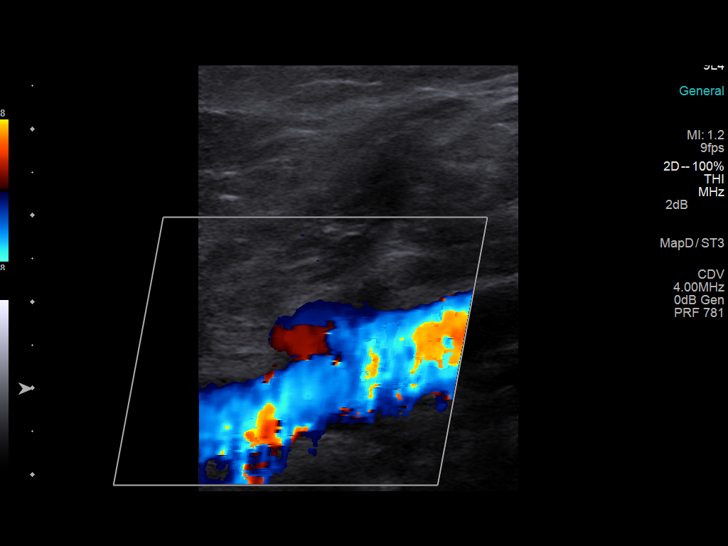
[im 26/34]
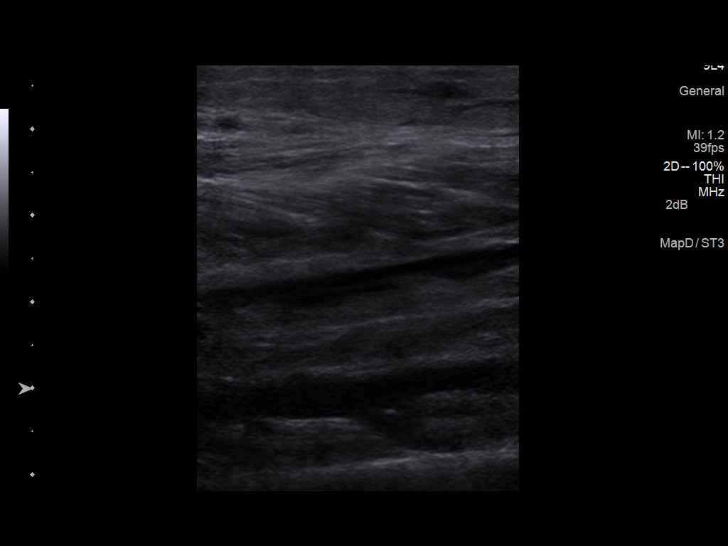
[im 28/34]
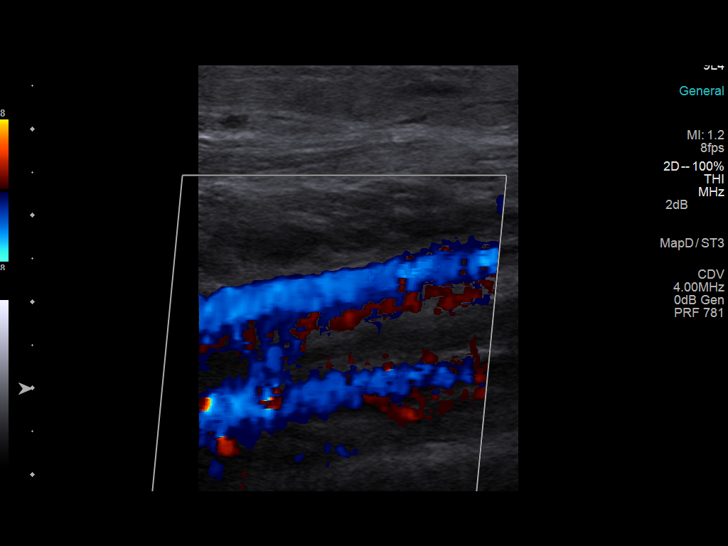
[im 31/34]
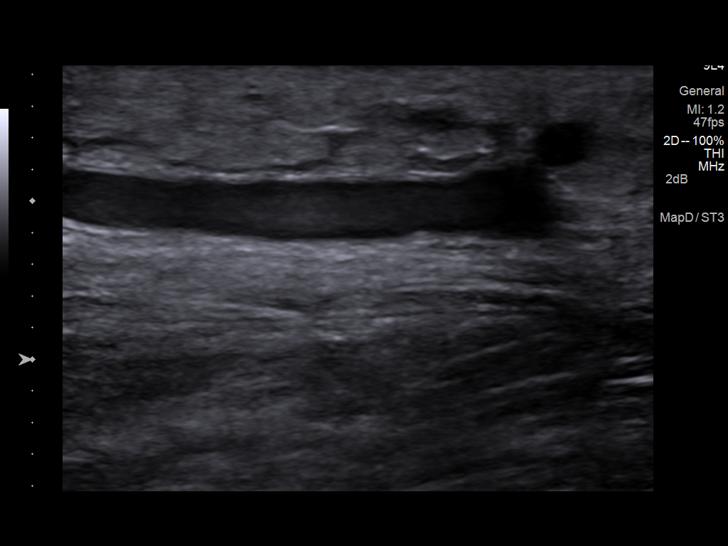
[im 34/34]
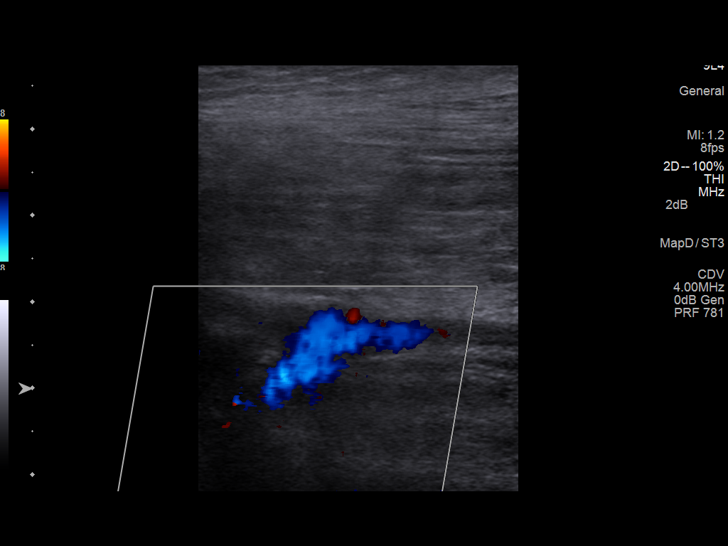

[14 of 24 positions shown; findings below may reference images not displayed]

FINDINGS: Thrombus within deep veins:  None visualized.

Compressibility of deep veins:  Normal.

Duplex waveform respiratory phasicity:  Normal.

Duplex waveform response to augmentation:  Normal.

Venous reflux:  None visualized.

Other findings: No evidence of superficial thrombophlebitis or
abnormal fluid collection.
IMPRESSION: No evidence of right lower extremity DVT.

## 2016-01-28 ENCOUNTER — Other Ambulatory Visit: Payer: Self-pay | Admitting: Nurse Practitioner

## 2016-02-10 IMAGING — US US SOFT TISSUE HEAD/NECK
1 series · 14 of 25 positions shown · non-contrast
Comparison: None

CLINICAL DATA: Anterior submental neck mass

EXAM:
ULTRASOUND OF HEAD/NECK SOFT TISSUES
TECHNIQUE: Ultrasound examination of the head and neck soft tissues was
performed in the area of clinical concern.

[Series 1: us soft tissue head/neck · 0.07mm/px · 14 of 38 slices shown]
[im 1/38]
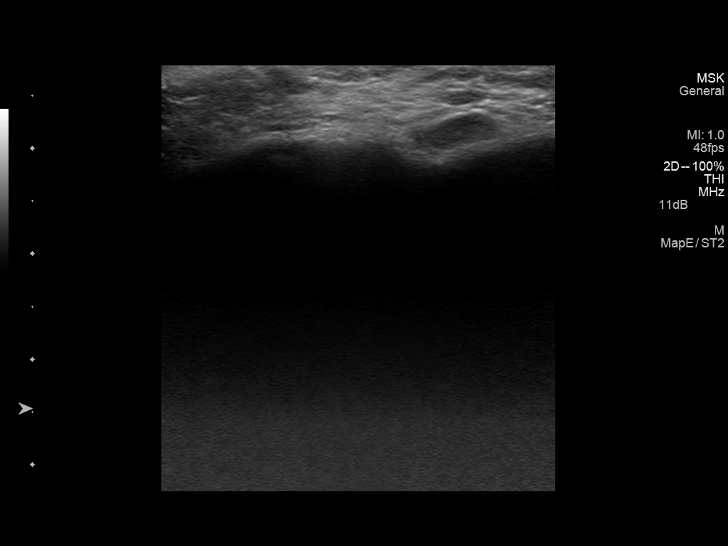
[im 4/38]
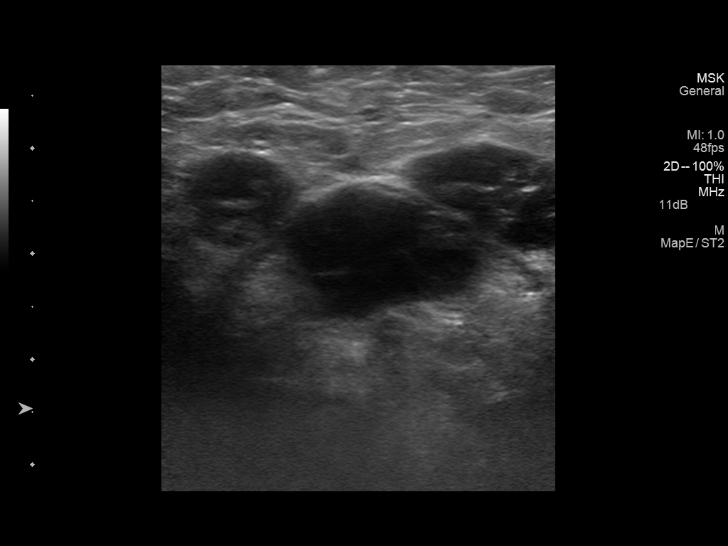
[im 7/38]
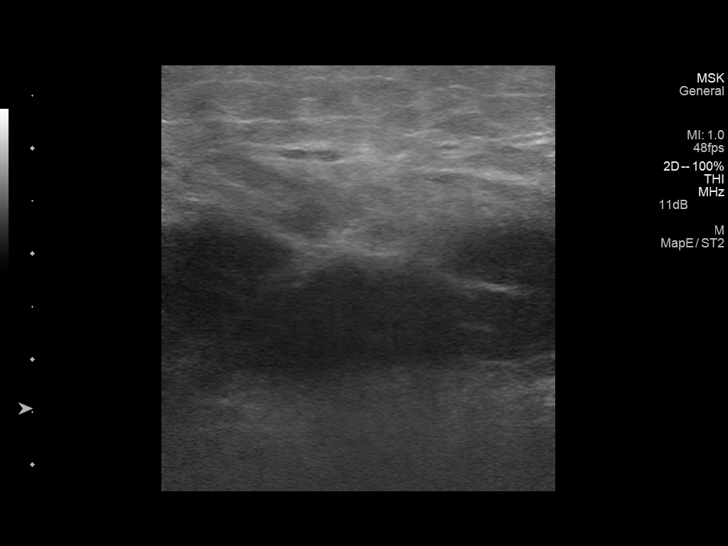
[im 10/38]
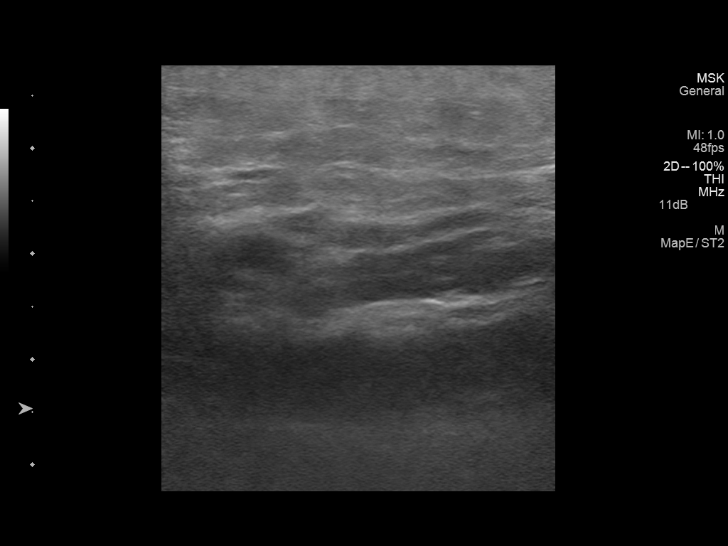
[im 13/38]
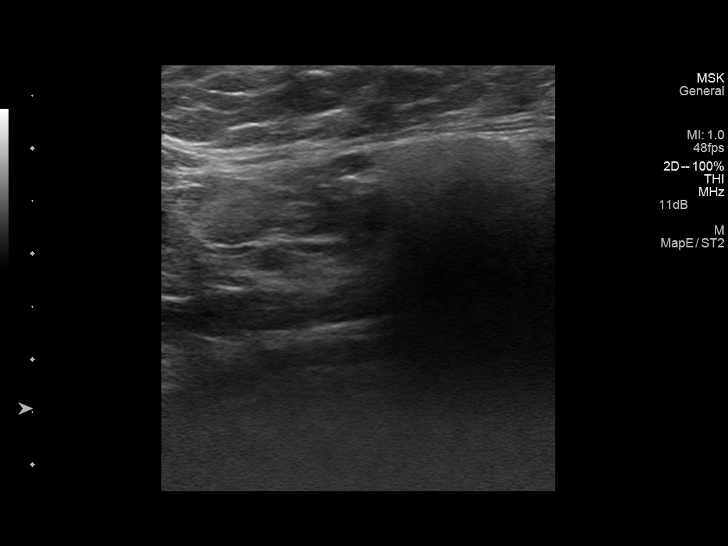
[im 14/38]
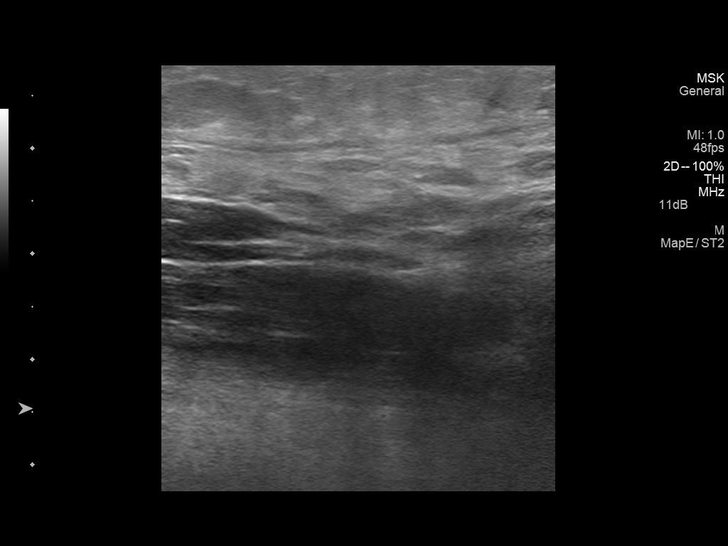
[im 17/38]
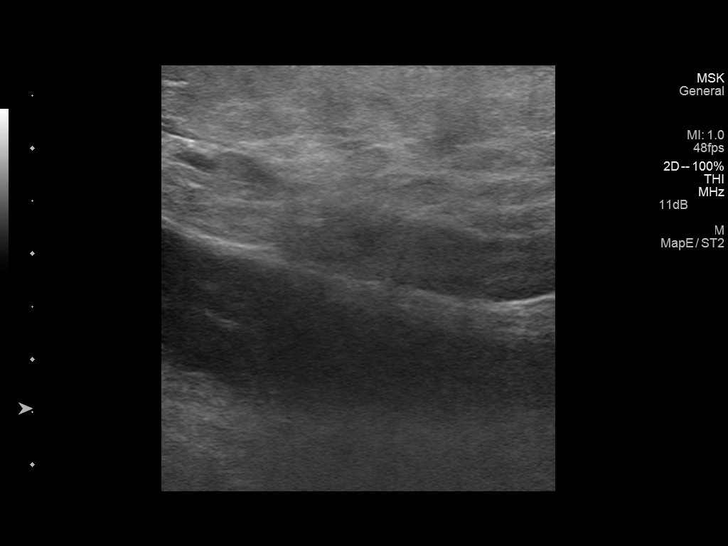
[im 21/38]
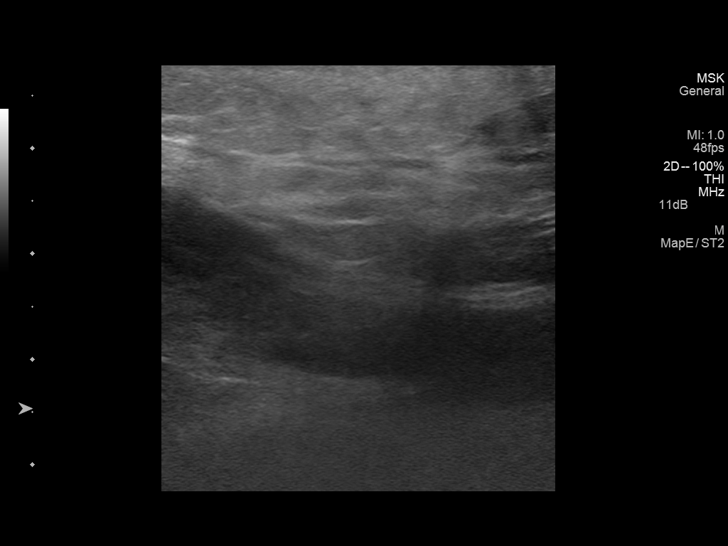
[im 24/38]
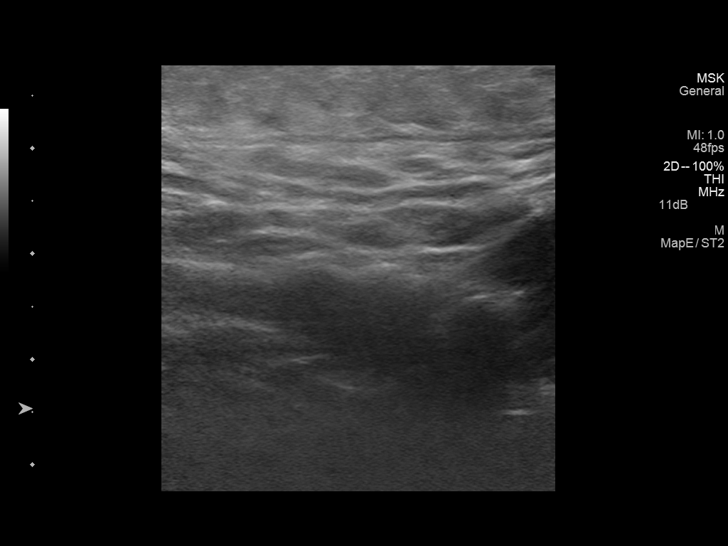
[im 25/38]
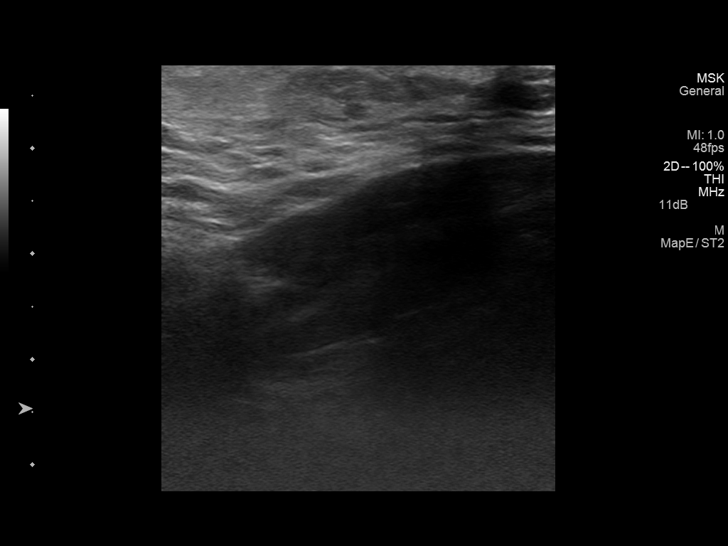
[im 28/38]
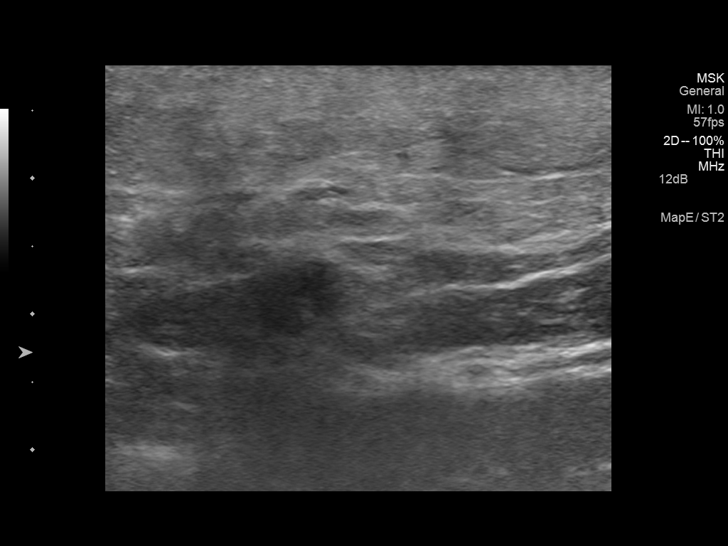
[im 31/38]
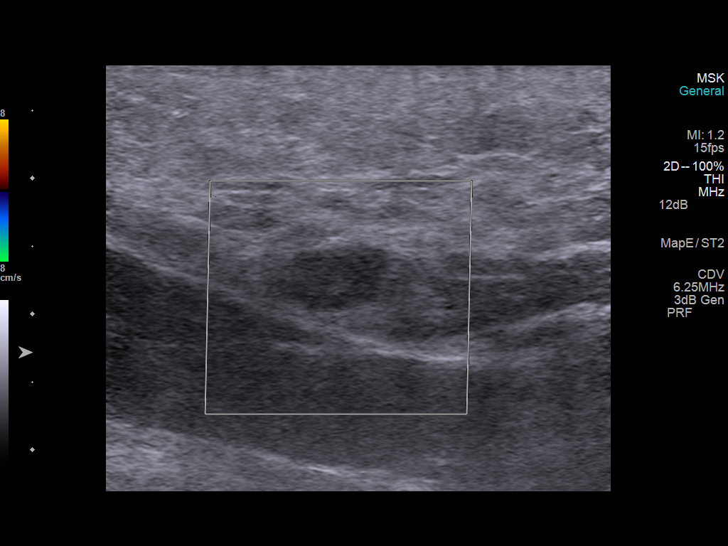
[im 34/38]
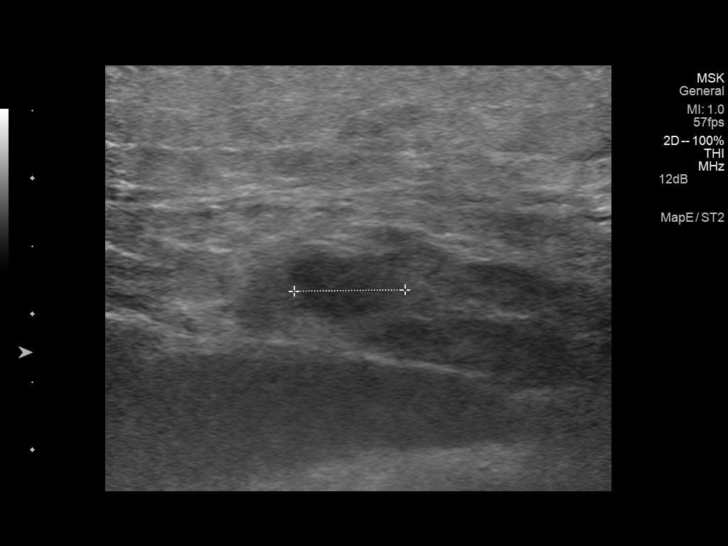
[im 38/38]
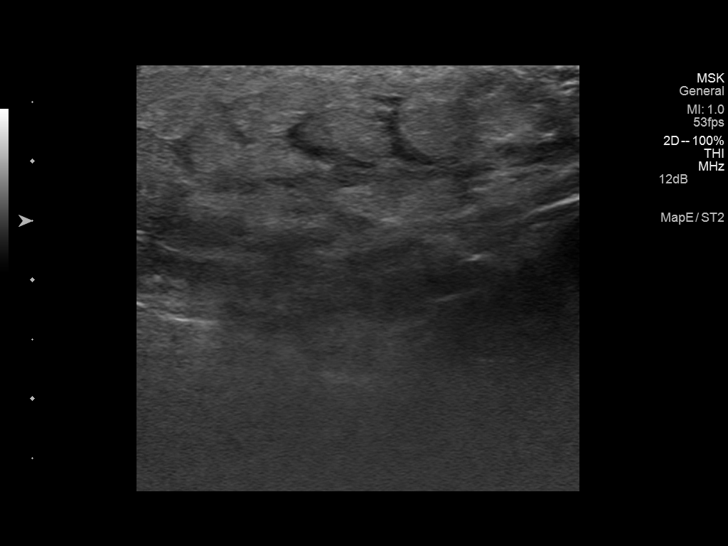

[14 of 25 positions shown; findings below may reference images not displayed]

FINDINGS: Sonography of the site of palpable concern performed.

Thickening of these skin and subcutaneous tissues is seen at this
site as compared to lateral aspects of the neck bilaterally.

Minimal subcutaneous edema at a single site but not throughout
remainder of the palpable region.

No mass, abnormal fluid collection/cyst, or enlarged lymph node
identified at the site of palpable abnormality.

I am unable to identify at the cause for the palpable finding at
real-time imaging.
IMPRESSION: Skin thickening and thickening of subcutaneous tissues at this site
of palpable concern at the submental region.

The palpable submental process is sonographically occult; if process
fails to resolve, consider followup CT or MR imaging of the neck
with contrast for further evaluation.

Discussed with Dr. Sanoshi at time of exam.

## 2016-05-22 IMAGING — CT CT CHEST W/ CM
2 of 3 series · 15 of 36 positions shown, 18 images · IV contrast (OMNIPAQUE)
Comparison: PET 12/08/2013 and CT chest 12/06/2013, 01/23/2009.

CLINICAL DATA: Followup lung cancer. Chemotherapy and radiation
therapy completed.

EXAM:
CT CHEST WITH CONTRAST
TECHNIQUE: Multidetector CT imaging of the chest was performed during
intravenous contrast administration.
CONTRAST:  80mL OMNIPAQUE IOHEXOL 300 MG/ML  SOLN

[Series 2: chest with st · axial · 0.87mm/px · z∈[-388,-52]mm · 12 of 79 slices shown, 15 images]
[im 6/79  mediastinal]
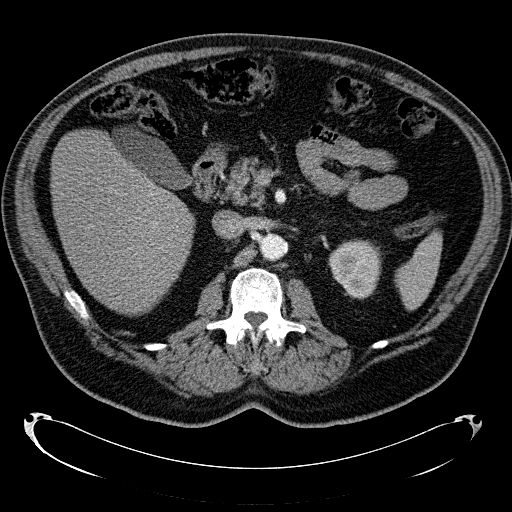
[im 6/79  lung]
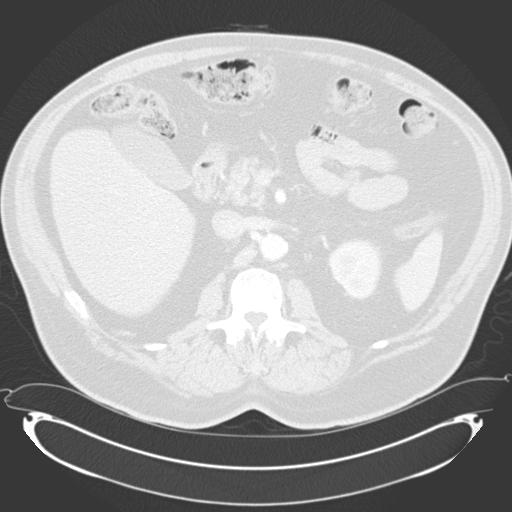
[im 12/79  lung]
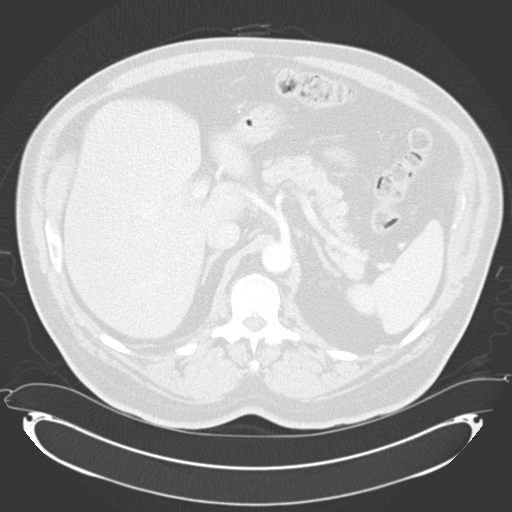
[im 18/79  lung]
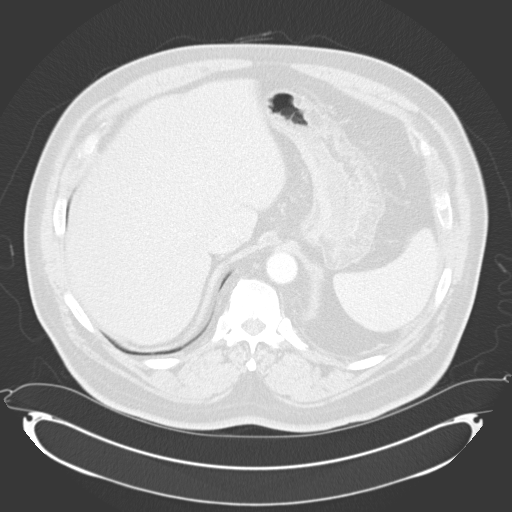
[im 24/79  lung]
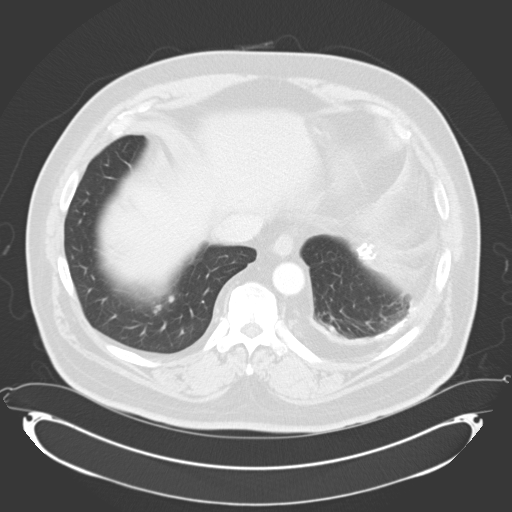
[im 29/79  mediastinal]
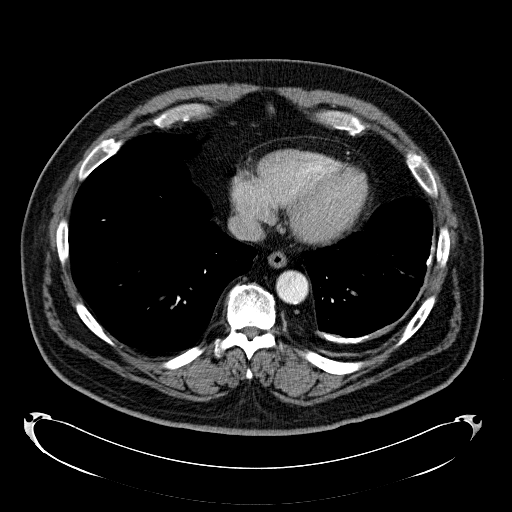
[im 29/79  lung]
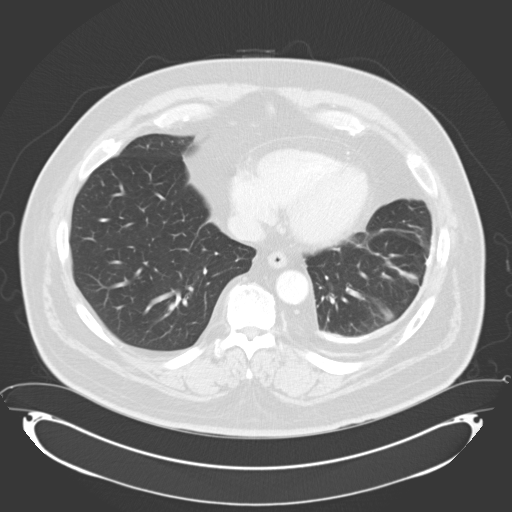
[im 35/79  lung]
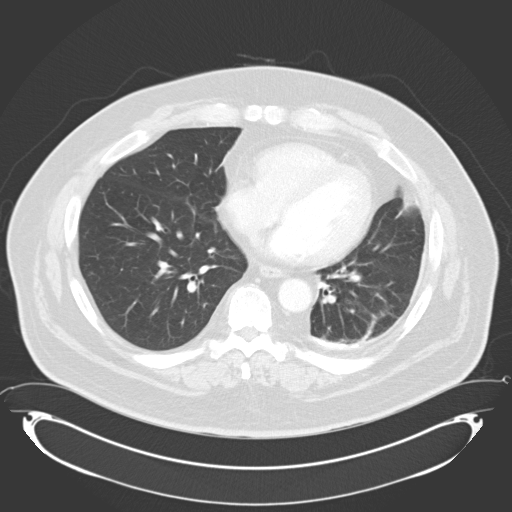
[im 44/79  lung]
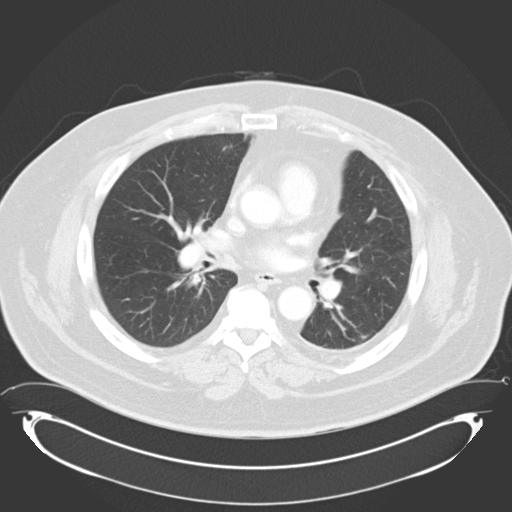
[im 50/79  lung]
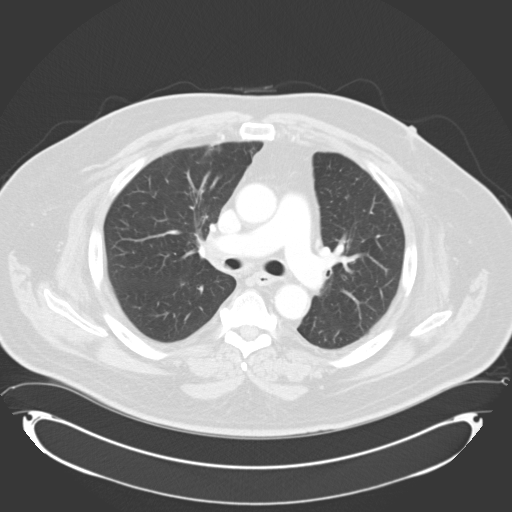
[im 55/79  mediastinal]
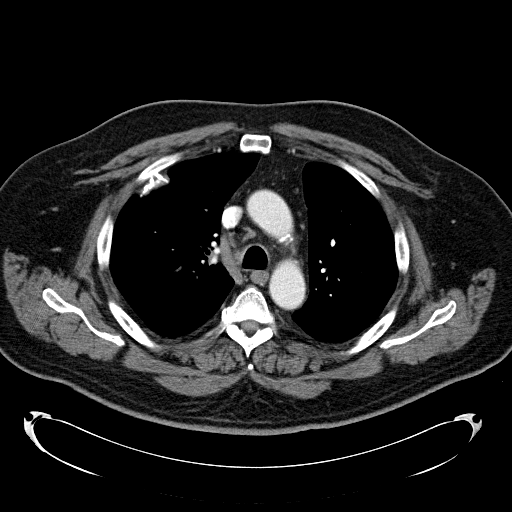
[im 55/79  lung]
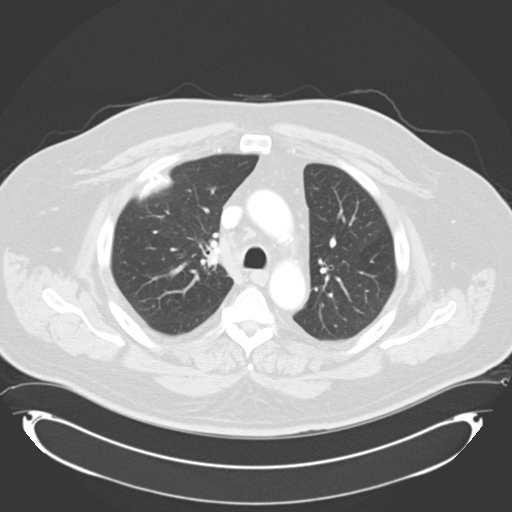
[im 61/79  lung]
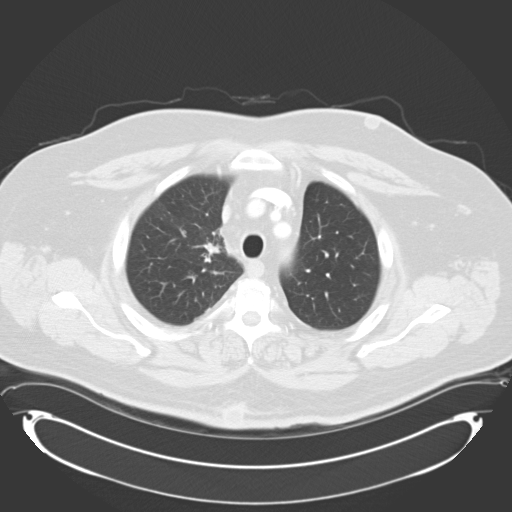
[im 67/79  lung]
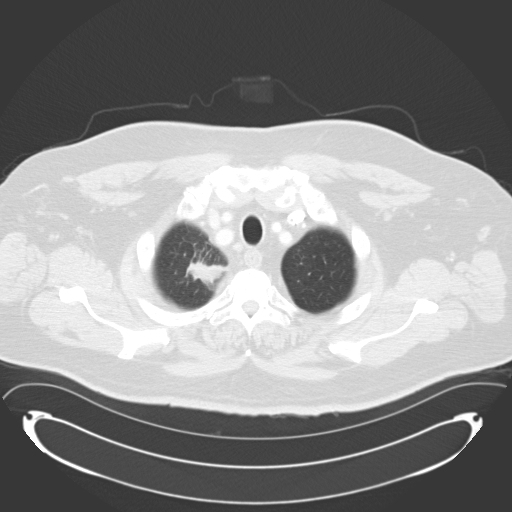
[im 73/79  lung]
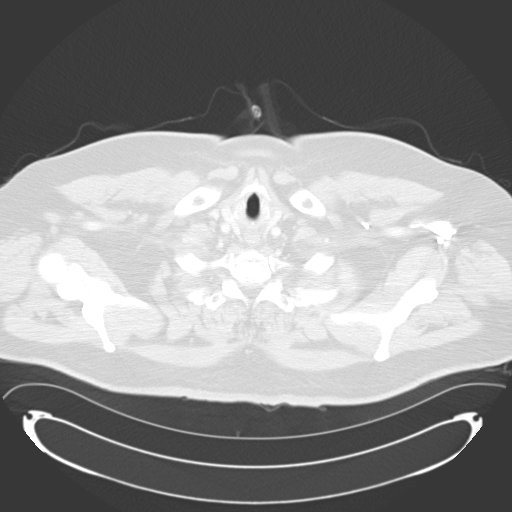

[Series 602: <mpr thick range> · coronal · 0.87mm/px · 3 of 108 slices shown]
[im 22/108  lung]
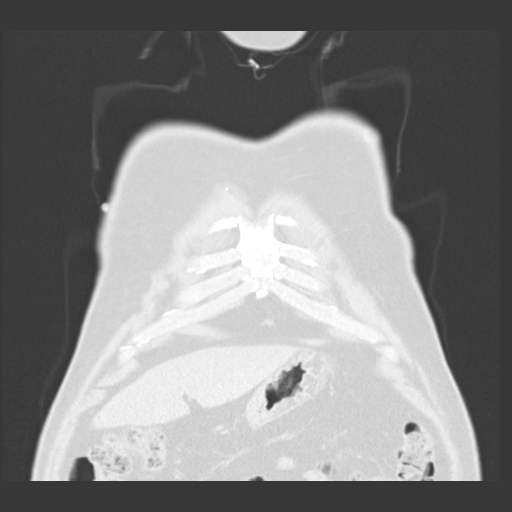
[im 43/108  lung]
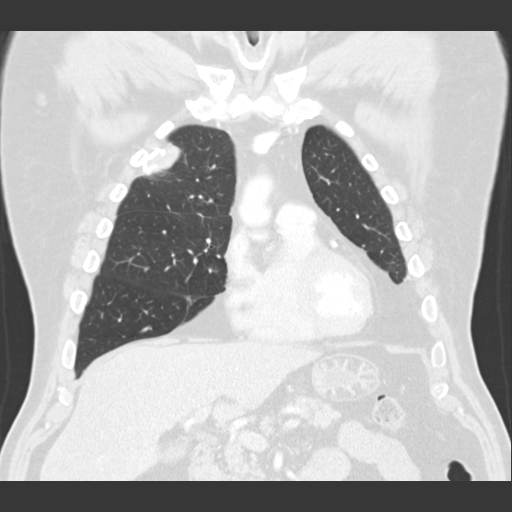
[im 65/108  lung]
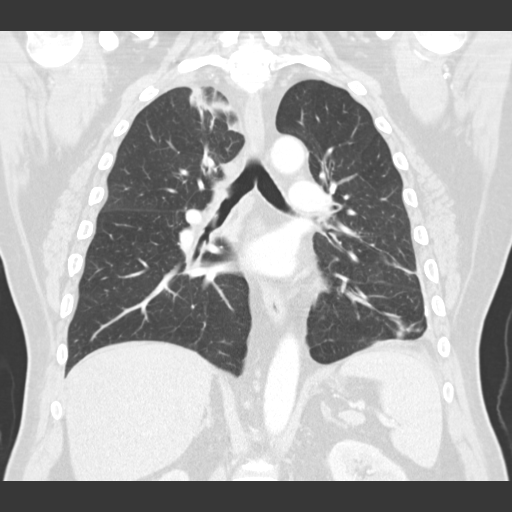

[15 of 36 positions shown; findings below may reference images not displayed]

FINDINGS: Mediastinal lymph nodes have decreased in size in the interval.
Index lower right paratracheal lymph node measures 10 mm (previously
14 mm). Right hilar lymph node measures 1.5 cm (previously 2.5 cm).
No axillary adenopathy. Scattered probable sebaceous cysts in the
subcutaneous soft tissues. At least 2 vessel coronary artery
calcification. Heart size normal. No pericardial effusion.

Mass in the apical segment right upper lobe has decreased in size in
the interval, now measuring 1.6 x 3.0 cm (previously 3.8 x 3.9 cm).
A separate area of nodular consolidation seen the inferiorly within
the right upper lobe measures 8 x 12 mm (previously 16 x 26 mm). Sub
cm subpleural nodularity along the right hemidiaphragm, unchanged.
Calcified pleural plaques are seen bilaterally. Scarring and volume
loss in the lingula and left lower lobe. There is narrowing of the
right-sided bronchi, worst to the right upper lobe.

Incidental imaging of the upper abdomen shows a 9 mm low-attenuation
lesion in the inferior right hepatic lobe, unchanged from
01/23/2009. Visualized portions of the liver, gallbladder, adrenal
glands, kidneys, spleen, pancreas, stomach and bowel are otherwise
grossly unremarkable. No upper abdominal adenopathy. No worrisome
lytic or sclerotic lesions. Degenerative changes are seen in the
spine.
IMPRESSION: 1. Interval decrease in size of right upper lobe mass and
mediastinal/right hilar adenopathy. Associated narrowing of right
upper lobe bronchi. No evidence of distant metastatic disease.
2. Coronary artery calcification.
3. Asbestos related pleural disease with scarring and volume loss in
the left lower lobe.
